# Patient Record
Sex: Female | Born: 1945
Health system: Southern US, Community
[De-identification: ages and names within clinical notes are randomized; demographics above are authoritative.]

## PROBLEM LIST (undated history)

## (undated) ENCOUNTER — Emergency Department (HOSPITAL_COMMUNITY): Admission: EM | Payer: Medicare Other | Source: Home / Self Care

## (undated) DIAGNOSIS — M5430 Sciatica, unspecified side: Secondary | ICD-10-CM

## (undated) DIAGNOSIS — N189 Chronic kidney disease, unspecified: Secondary | ICD-10-CM

## (undated) DIAGNOSIS — I129 Hypertensive chronic kidney disease with stage 1 through stage 4 chronic kidney disease, or unspecified chronic kidney disease: Secondary | ICD-10-CM

## (undated) DIAGNOSIS — I151 Hypertension secondary to other renal disorders: Secondary | ICD-10-CM

## (undated) DIAGNOSIS — K589 Irritable bowel syndrome without diarrhea: Secondary | ICD-10-CM

## (undated) DIAGNOSIS — R51 Headache: Secondary | ICD-10-CM

## (undated) DIAGNOSIS — M1990 Unspecified osteoarthritis, unspecified site: Secondary | ICD-10-CM

## (undated) DIAGNOSIS — E559 Vitamin D deficiency, unspecified: Secondary | ICD-10-CM

## (undated) DIAGNOSIS — R269 Unspecified abnormalities of gait and mobility: Principal | ICD-10-CM

## (undated) DIAGNOSIS — R600 Localized edema: Secondary | ICD-10-CM

## (undated) DIAGNOSIS — J439 Emphysema, unspecified: Secondary | ICD-10-CM

## (undated) DIAGNOSIS — J449 Chronic obstructive pulmonary disease, unspecified: Secondary | ICD-10-CM

## (undated) DIAGNOSIS — Z972 Presence of dental prosthetic device (complete) (partial): Secondary | ICD-10-CM

## (undated) DIAGNOSIS — H8110 Benign paroxysmal vertigo, unspecified ear: Secondary | ICD-10-CM

## (undated) DIAGNOSIS — D329 Benign neoplasm of meninges, unspecified: Secondary | ICD-10-CM

## (undated) DIAGNOSIS — K449 Diaphragmatic hernia without obstruction or gangrene: Secondary | ICD-10-CM

## (undated) DIAGNOSIS — E538 Deficiency of other specified B group vitamins: Secondary | ICD-10-CM

## (undated) DIAGNOSIS — Z8619 Personal history of other infectious and parasitic diseases: Secondary | ICD-10-CM

## (undated) DIAGNOSIS — Z9889 Other specified postprocedural states: Secondary | ICD-10-CM

## (undated) DIAGNOSIS — F5104 Psychophysiologic insomnia: Secondary | ICD-10-CM

## (undated) DIAGNOSIS — N201 Calculus of ureter: Secondary | ICD-10-CM

## (undated) DIAGNOSIS — M51379 Other intervertebral disc degeneration, lumbosacral region without mention of lumbar back pain or lower extremity pain: Secondary | ICD-10-CM

## (undated) DIAGNOSIS — M5137 Other intervertebral disc degeneration, lumbosacral region: Secondary | ICD-10-CM

## (undated) DIAGNOSIS — K219 Gastro-esophageal reflux disease without esophagitis: Secondary | ICD-10-CM

## (undated) DIAGNOSIS — J410 Simple chronic bronchitis: Secondary | ICD-10-CM

## (undated) DIAGNOSIS — K08109 Complete loss of teeth, unspecified cause, unspecified class: Secondary | ICD-10-CM

## (undated) HISTORY — DX: Deficiency of other specified B group vitamins: E53.8

## (undated) HISTORY — DX: Sciatica, unspecified side: M54.30

## (undated) HISTORY — PX: TONSILLECTOMY: SUR1361

## (undated) HISTORY — DX: Unspecified osteoarthritis, unspecified site: M19.90

## (undated) HISTORY — DX: Unspecified abnormalities of gait and mobility: R26.9

## (undated) HISTORY — DX: Psychophysiologic insomnia: F51.04

## (undated) HISTORY — DX: Headache: R51

## (undated) HISTORY — DX: Benign paroxysmal vertigo, unspecified ear: H81.10

## (undated) HISTORY — DX: Chronic obstructive pulmonary disease, unspecified: J44.9

## (undated) HISTORY — DX: Vitamin D deficiency, unspecified: E55.9

---

## 1992-03-29 HISTORY — PX: PARTIAL HYSTERECTOMY: SHX80

## 1992-03-29 HISTORY — PX: VAGINAL HYSTERECTOMY: SUR661

## 2009-05-26 ENCOUNTER — Emergency Department (HOSPITAL_COMMUNITY): Admission: EM | Admit: 2009-05-26 | Discharge: 2009-05-26 | Payer: Self-pay | Admitting: Emergency Medicine

## 2009-06-23 ENCOUNTER — Encounter: Admission: RE | Admit: 2009-06-23 | Discharge: 2009-06-23 | Payer: Self-pay | Admitting: Otolaryngology

## 2010-01-26 ENCOUNTER — Encounter: Admission: RE | Admit: 2010-01-26 | Discharge: 2010-01-26 | Payer: Self-pay | Admitting: Otolaryngology

## 2010-03-03 ENCOUNTER — Encounter: Admission: RE | Admit: 2010-03-03 | Discharge: 2010-03-03 | Payer: Self-pay | Admitting: Internal Medicine

## 2010-04-19 ENCOUNTER — Encounter: Payer: Self-pay | Admitting: Otolaryngology

## 2011-02-01 ENCOUNTER — Other Ambulatory Visit: Payer: Self-pay | Admitting: Internal Medicine

## 2011-02-01 DIAGNOSIS — Z1231 Encounter for screening mammogram for malignant neoplasm of breast: Secondary | ICD-10-CM

## 2011-03-10 ENCOUNTER — Ambulatory Visit: Admission: RE | Admit: 2011-03-10 | Payer: Self-pay | Source: Ambulatory Visit

## 2011-05-31 ENCOUNTER — Ambulatory Visit (INDEPENDENT_AMBULATORY_CARE_PROVIDER_SITE_OTHER): Payer: Medicare Other | Admitting: Family Medicine

## 2011-05-31 DIAGNOSIS — L280 Lichen simplex chronicus: Secondary | ICD-10-CM | POA: Diagnosis not present

## 2011-05-31 DIAGNOSIS — B029 Zoster without complications: Secondary | ICD-10-CM

## 2011-05-31 DIAGNOSIS — L259 Unspecified contact dermatitis, unspecified cause: Secondary | ICD-10-CM

## 2011-05-31 DIAGNOSIS — Z79899 Other long term (current) drug therapy: Secondary | ICD-10-CM

## 2011-05-31 MED ORDER — VALACYCLOVIR HCL 1 G PO TABS: 1000.0000 mg | ORAL_TABLET | Freq: Three times a day (TID) | ORAL | Status: AC

## 2011-05-31 MED ORDER — CETIRIZINE HCL 10 MG PO TABS: 10.0000 mg | ORAL_TABLET | Freq: Every day | ORAL | Status: DC

## 2011-05-31 MED ORDER — FLUOCINONIDE-E 0.05 % EX CREA: TOPICAL_CREAM | CUTANEOUS | Status: DC

## 2011-05-31 NOTE — Progress Notes (Signed)
  Subjective:    Patient ID: Jenny Oliver, female    DOB: Jul 21, 1945, 66 y.o.   MRN: [id number]  HPI  Jenny Oliver is a 66 y.o. female Started 05/13/11 - bit by spider, then tick noted on abdomen.  Tick there less than 12 hours - didn't see it that morning. Itching in area, pulled out with kleenex/tweezers.  Alcohol and ammonia applied to area.  Next day noticed itchy rash.  No bullseye.  Just itchy rash.  Still itching, and rash spreading.  Bumps on left side of abdomen, R upper back few days - itchy rash, slight soreness initially.  No known poison ivy exposure, but dog at home that goes outside.  New patch on L flank to L abdomen - started 48-72 hours ago.    No new detergent, fabric softener.   Tx: cortisone, salve, rubbing alcohol.  Review of Systems  Constitutional: Positive for fatigue. Negative for fever and chills.  HENT: Negative for neck pain and neck stiffness.   Respiratory: Positive for cough. Negative for chest tightness and shortness of breath.        Has had cough since November- followed by Dr. Nehemiah Settle.  No new chest symptoms.   Cardiovascular: Negative for chest pain.  Gastrointestinal: Negative for nausea, vomiting, abdominal pain and abdominal distention.  Skin: Positive for rash.  Neurological: Positive for headaches.  Psychiatric/Behavioral: Negative for hallucinations and dysphoric mood. The patient is not nervous/anxious.        Objective:   Physical Exam  Constitutional: She is oriented to person, place, and time. She appears well-developed and well-nourished.  HENT:  Head: Normocephalic and atraumatic.  Eyes: Conjunctivae are normal. Pupils are equal, round, and reactive to light.  Neck: Normal range of motion.  Cardiovascular: Normal rate, regular rhythm, normal heart sounds and intact distal pulses.   No murmur heard. Pulmonary/Chest: Effort normal and breath sounds normal.  Abdominal: Soft.  Neurological: She is alert and oriented to person, place,  and time.  Skin: Skin is warm and dry. Abrasion, lesion and rash noted. No bruising and no ecchymosis noted. Rash is urticarial. Rash is not maculopapular. There is erythema.          Excoriated R  Upper shoulder.  Other oval ares are patches of urticarial/erythematous lesions, some with slight excoriation.  No target lesions, no petechiae.  Psychiatric: She has a normal mood and affect. Her behavior is normal.    Results for orders placed in visit on 05/31/11  GLUCOSE, POCT (MANUAL RESULT ENTRY)      Component Value Range   POC Glucose 78       2nd MD opinion/exam obtained..    Assessment & Plan:  Jenny Oliver is a 66 y.o. female With pruritic rash, mutiple areas on trunk since tick bite approximately 2 1/2 weeks ago,  Suspected contact dermatitis on most of trunk, with new coalesecent erythe,matous patches on L flank concerning for secondary herpes zoster.  Start lidex top BID prn to affected areas.Valtrex 1 gram TID #21 for possible zoster.  zyrtec 10mg  qd prn pruritus.  Avoid drying agents like alcohl to area - ok to use Aveeno lotion topically.  Return to the clinic or go to the nearest emergency room ifr symptoms worsen or new symptoms occur. - Hx cough - followed by primary MD, but rtc/er precautions given.

## 2011-05-31 NOTE — Patient Instructions (Signed)
Take valtrex as prescribed for possible shingles,  Avoid drying agents to skin - ok to use Aveeno lotion.  cetririzine - 1 per day as needed for itching, and prescribed steroid cream up to twice per day to itching areas.    Return to the clinic or go to the nearest emergency room if any of your symptoms worsen or new symptoms occur.

## 2011-06-21 ENCOUNTER — Ambulatory Visit (INDEPENDENT_AMBULATORY_CARE_PROVIDER_SITE_OTHER): Payer: Medicare Other | Admitting: Emergency Medicine

## 2011-06-21 ENCOUNTER — Ambulatory Visit: Payer: Medicare Other

## 2011-06-21 VITALS — BP 117/72 | HR 66 | Temp 98.0°F | Resp 16 | Ht 63.25 in | Wt 149.0 lb

## 2011-06-21 DIAGNOSIS — W57XXXA Bitten or stung by nonvenomous insect and other nonvenomous arthropods, initial encounter: Secondary | ICD-10-CM | POA: Diagnosis not present

## 2011-06-21 DIAGNOSIS — J069 Acute upper respiratory infection, unspecified: Secondary | ICD-10-CM

## 2011-06-21 DIAGNOSIS — R05 Cough: Secondary | ICD-10-CM

## 2011-06-21 DIAGNOSIS — J209 Acute bronchitis, unspecified: Secondary | ICD-10-CM

## 2011-06-21 DIAGNOSIS — T148 Other injury of unspecified body region: Secondary | ICD-10-CM | POA: Diagnosis not present

## 2011-06-21 DIAGNOSIS — I2510 Atherosclerotic heart disease of native coronary artery without angina pectoris: Secondary | ICD-10-CM

## 2011-06-21 LAB — POCT CBC
Granulocyte percent: 25.7 %G — AB (ref 37–80)
HCT, POC: 42.2 % (ref 37.7–47.9)
Lymph, poc: 3.3 (ref 0.6–3.4)
MCHC: 32.7 g/dL (ref 31.8–35.4)
MCV: 101.9 fL — AB (ref 80–97)
POC LYMPH PERCENT: 67.8 %L — AB (ref 10–50)
RDW, POC: 14 %
WBC: 4.8 10*3/uL (ref 4.6–10.2)

## 2011-06-21 MED ORDER — DOXYCYCLINE HYCLATE 100 MG PO TABS: 100.0000 mg | ORAL_TABLET | Freq: Two times a day (BID) | ORAL | Status: AC

## 2011-06-21 MED ORDER — BENZONATATE 200 MG PO CAPS: 200.0000 mg | ORAL_CAPSULE | Freq: Three times a day (TID) | ORAL | Status: AC | PRN

## 2011-06-21 NOTE — Progress Notes (Signed)
  Subjective:    Patient ID: Jenny Oliver, female    DOB: 23-Sep-1945, 66 y.o.   MRN: [id number]  HPI enters with a chief complaint of having a tick bite to the abdomen approximately one month ago. Following this she has felt well. She initially had a rash which resolved. She now has about a week history of head congestion sore throat cough and she states her cough is nonproductive. It has not been associated with fevers chills or night sweats. She is a heavy smoker.    Review of Systems noncontributory except as relates to this acute illness.     Objective:   Physical Exam  Constitutional: She appears well-developed.  HENT:  Right Ear: External ear normal.  Left Ear: External ear normal.  Eyes: Pupils are equal, round, and reactive to light.  Neck: No JVD present. No tracheal deviation present. No thyromegaly present.  Cardiovascular: Normal heart sounds.   Pulmonary/Chest: No respiratory distress. She has no wheezes. She has no rales. She exhibits no tenderness.  Lymphadenopathy:    She has no cervical adenopathy.    [facility] reading (PRIMARY) by  Dr.Julane Crock         Assessment & Plan:   Presents 2 problems at the present time.  A history of a tick bite about a month ago. She really does not have a true symptoms of Lyme disease but is concerned about that. Problem is a recent upper rest or infection which is associated with sore throat and a dry cough.

## 2011-06-22 DIAGNOSIS — M5137 Other intervertebral disc degeneration, lumbosacral region: Secondary | ICD-10-CM | POA: Diagnosis not present

## 2011-06-22 DIAGNOSIS — M62838 Other muscle spasm: Secondary | ICD-10-CM | POA: Diagnosis not present

## 2011-06-22 DIAGNOSIS — M999 Biomechanical lesion, unspecified: Secondary | ICD-10-CM | POA: Diagnosis not present

## 2011-06-22 LAB — B. BURGDORFI ANTIBODIES: B burgdorferi Ab IgG+IgM: 0.16 {ISR}

## 2011-06-23 LAB — ROCKY MTN SPOTTED FVR AB, IGM-BLOOD: ROCKY MTN SPOTTED FEVER, IGM: 0.11 IV

## 2011-06-24 DIAGNOSIS — M5137 Other intervertebral disc degeneration, lumbosacral region: Secondary | ICD-10-CM | POA: Diagnosis not present

## 2011-06-24 DIAGNOSIS — M999 Biomechanical lesion, unspecified: Secondary | ICD-10-CM | POA: Diagnosis not present

## 2011-06-24 DIAGNOSIS — M62838 Other muscle spasm: Secondary | ICD-10-CM | POA: Diagnosis not present

## 2011-07-22 DIAGNOSIS — M5137 Other intervertebral disc degeneration, lumbosacral region: Secondary | ICD-10-CM | POA: Diagnosis not present

## 2011-07-22 DIAGNOSIS — M62838 Other muscle spasm: Secondary | ICD-10-CM | POA: Diagnosis not present

## 2011-07-22 DIAGNOSIS — M999 Biomechanical lesion, unspecified: Secondary | ICD-10-CM | POA: Diagnosis not present

## 2011-08-05 DIAGNOSIS — E782 Mixed hyperlipidemia: Secondary | ICD-10-CM | POA: Diagnosis not present

## 2011-08-05 DIAGNOSIS — F17200 Nicotine dependence, unspecified, uncomplicated: Secondary | ICD-10-CM | POA: Diagnosis not present

## 2011-08-05 DIAGNOSIS — I10 Essential (primary) hypertension: Secondary | ICD-10-CM | POA: Diagnosis not present

## 2011-08-06 DIAGNOSIS — M999 Biomechanical lesion, unspecified: Secondary | ICD-10-CM | POA: Diagnosis not present

## 2011-08-06 DIAGNOSIS — M5137 Other intervertebral disc degeneration, lumbosacral region: Secondary | ICD-10-CM | POA: Diagnosis not present

## 2011-08-06 DIAGNOSIS — M62838 Other muscle spasm: Secondary | ICD-10-CM | POA: Diagnosis not present

## 2011-08-16 DIAGNOSIS — M62838 Other muscle spasm: Secondary | ICD-10-CM | POA: Diagnosis not present

## 2011-08-16 DIAGNOSIS — M5137 Other intervertebral disc degeneration, lumbosacral region: Secondary | ICD-10-CM | POA: Diagnosis not present

## 2011-08-16 DIAGNOSIS — M999 Biomechanical lesion, unspecified: Secondary | ICD-10-CM | POA: Diagnosis not present

## 2011-08-31 DIAGNOSIS — E559 Vitamin D deficiency, unspecified: Secondary | ICD-10-CM | POA: Diagnosis not present

## 2011-08-31 DIAGNOSIS — R7301 Impaired fasting glucose: Secondary | ICD-10-CM | POA: Diagnosis not present

## 2011-08-31 DIAGNOSIS — E785 Hyperlipidemia, unspecified: Secondary | ICD-10-CM | POA: Diagnosis not present

## 2011-08-31 DIAGNOSIS — E269 Hyperaldosteronism, unspecified: Secondary | ICD-10-CM | POA: Diagnosis not present

## 2011-09-02 DIAGNOSIS — R7301 Impaired fasting glucose: Secondary | ICD-10-CM | POA: Diagnosis not present

## 2011-09-02 DIAGNOSIS — E559 Vitamin D deficiency, unspecified: Secondary | ICD-10-CM | POA: Diagnosis not present

## 2011-09-02 DIAGNOSIS — I10 Essential (primary) hypertension: Secondary | ICD-10-CM | POA: Diagnosis not present

## 2011-09-02 DIAGNOSIS — R7309 Other abnormal glucose: Secondary | ICD-10-CM | POA: Diagnosis not present

## 2011-09-02 DIAGNOSIS — M899 Disorder of bone, unspecified: Secondary | ICD-10-CM | POA: Diagnosis not present

## 2011-09-02 DIAGNOSIS — E269 Hyperaldosteronism, unspecified: Secondary | ICD-10-CM | POA: Diagnosis not present

## 2011-09-02 DIAGNOSIS — F17200 Nicotine dependence, unspecified, uncomplicated: Secondary | ICD-10-CM | POA: Diagnosis not present

## 2011-09-02 DIAGNOSIS — E785 Hyperlipidemia, unspecified: Secondary | ICD-10-CM | POA: Diagnosis not present

## 2011-09-03 DIAGNOSIS — M999 Biomechanical lesion, unspecified: Secondary | ICD-10-CM | POA: Diagnosis not present

## 2011-09-03 DIAGNOSIS — M5137 Other intervertebral disc degeneration, lumbosacral region: Secondary | ICD-10-CM | POA: Diagnosis not present

## 2011-09-03 DIAGNOSIS — M62838 Other muscle spasm: Secondary | ICD-10-CM | POA: Diagnosis not present

## 2011-09-29 DIAGNOSIS — M5137 Other intervertebral disc degeneration, lumbosacral region: Secondary | ICD-10-CM | POA: Diagnosis not present

## 2011-09-29 DIAGNOSIS — M62838 Other muscle spasm: Secondary | ICD-10-CM | POA: Diagnosis not present

## 2011-09-29 DIAGNOSIS — M999 Biomechanical lesion, unspecified: Secondary | ICD-10-CM | POA: Diagnosis not present

## 2011-10-18 DIAGNOSIS — R1310 Dysphagia, unspecified: Secondary | ICD-10-CM | POA: Diagnosis not present

## 2011-10-18 DIAGNOSIS — R141 Gas pain: Secondary | ICD-10-CM | POA: Diagnosis not present

## 2011-10-18 DIAGNOSIS — K219 Gastro-esophageal reflux disease without esophagitis: Secondary | ICD-10-CM | POA: Diagnosis not present

## 2011-10-18 DIAGNOSIS — R109 Unspecified abdominal pain: Secondary | ICD-10-CM | POA: Diagnosis not present

## 2011-10-18 DIAGNOSIS — R142 Eructation: Secondary | ICD-10-CM | POA: Diagnosis not present

## 2011-10-26 DIAGNOSIS — M5137 Other intervertebral disc degeneration, lumbosacral region: Secondary | ICD-10-CM | POA: Diagnosis not present

## 2011-10-26 DIAGNOSIS — M999 Biomechanical lesion, unspecified: Secondary | ICD-10-CM | POA: Diagnosis not present

## 2011-10-26 DIAGNOSIS — M62838 Other muscle spasm: Secondary | ICD-10-CM | POA: Diagnosis not present

## 2011-11-03 ENCOUNTER — Ambulatory Visit (HOSPITAL_COMMUNITY): Payer: Medicare Other | Admitting: Anesthesiology

## 2011-11-03 ENCOUNTER — Encounter (HOSPITAL_COMMUNITY): Payer: Self-pay | Admitting: Anesthesiology

## 2011-11-03 ENCOUNTER — Encounter (HOSPITAL_COMMUNITY)
Admission: RE | Disposition: A | Discharge status: Home / Self Care | Payer: Self-pay | Source: Ambulatory Visit | Attending: Gastroenterology

## 2011-11-03 ENCOUNTER — Encounter (HOSPITAL_COMMUNITY): Payer: Self-pay

## 2011-11-03 ENCOUNTER — Ambulatory Visit (HOSPITAL_COMMUNITY)
Admission: RE | Admit: 2011-11-03 | Discharge: 2011-11-03 | Disposition: A | Discharge status: Home / Self Care | Payer: Medicare Other | Source: Ambulatory Visit | Attending: Gastroenterology | Admitting: Gastroenterology

## 2011-11-03 DIAGNOSIS — K228 Other specified diseases of esophagus: Secondary | ICD-10-CM | POA: Insufficient documentation

## 2011-11-03 DIAGNOSIS — K219 Gastro-esophageal reflux disease without esophagitis: Secondary | ICD-10-CM | POA: Insufficient documentation

## 2011-11-03 DIAGNOSIS — F17200 Nicotine dependence, unspecified, uncomplicated: Secondary | ICD-10-CM | POA: Insufficient documentation

## 2011-11-03 DIAGNOSIS — R1310 Dysphagia, unspecified: Secondary | ICD-10-CM | POA: Insufficient documentation

## 2011-11-03 DIAGNOSIS — R109 Unspecified abdominal pain: Secondary | ICD-10-CM | POA: Diagnosis not present

## 2011-11-03 DIAGNOSIS — K2990 Gastroduodenitis, unspecified, without bleeding: Secondary | ICD-10-CM | POA: Diagnosis not present

## 2011-11-03 DIAGNOSIS — K449 Diaphragmatic hernia without obstruction or gangrene: Secondary | ICD-10-CM | POA: Diagnosis not present

## 2011-11-03 DIAGNOSIS — R1011 Right upper quadrant pain: Secondary | ICD-10-CM | POA: Insufficient documentation

## 2011-11-03 DIAGNOSIS — K2289 Other specified disease of esophagus: Secondary | ICD-10-CM | POA: Insufficient documentation

## 2011-11-03 DIAGNOSIS — K2940 Chronic atrophic gastritis without bleeding: Secondary | ICD-10-CM | POA: Insufficient documentation

## 2011-11-03 DIAGNOSIS — N189 Chronic kidney disease, unspecified: Secondary | ICD-10-CM | POA: Diagnosis not present

## 2011-11-03 DIAGNOSIS — K2970 Gastritis, unspecified, without bleeding: Secondary | ICD-10-CM | POA: Diagnosis not present

## 2011-11-03 HISTORY — DX: Chronic kidney disease, unspecified: N18.9

## 2011-11-03 HISTORY — PX: BALLOON DILATION: SHX5330

## 2011-11-03 HISTORY — DX: Hypertension secondary to other renal disorders: I15.1

## 2011-11-03 HISTORY — DX: Gastro-esophageal reflux disease without esophagitis: K21.9

## 2011-11-03 SURGERY — ESOPHAGOGASTRODUODENOSCOPY (EGD) WITH PROPOFOL
Anesthesia: Monitor Anesthesia Care

## 2011-11-03 MED ORDER — PROPOFOL 10 MG/ML IV EMUL
INTRAVENOUS | Status: DC | PRN
  Administered 2011-11-03: 75 ug/kg/min via INTRAVENOUS

## 2011-11-03 MED ORDER — LACTATED RINGERS IV SOLN
INTRAVENOUS | Status: DC | PRN
  Administered 2011-11-03: 09:00:00 via INTRAVENOUS

## 2011-11-03 MED ORDER — PROPOFOL 10 MG/ML IV BOLUS
INTRAVENOUS | Status: DC | PRN
  Administered 2011-11-03 (×5): 20 mg via INTRAVENOUS

## 2011-11-03 MED ORDER — SODIUM CHLORIDE 0.9 % IV SOLN: INTRAVENOUS | Status: DC

## 2011-11-03 MED ORDER — MIDAZOLAM HCL 5 MG/5ML IJ SOLN
INTRAMUSCULAR | Status: DC | PRN
  Administered 2011-11-03 (×2): 1 mg via INTRAVENOUS

## 2011-11-03 MED ORDER — BUTAMBEN-TETRACAINE-BENZOCAINE 2-2-14 % EX AERO
INHALATION_SPRAY | CUTANEOUS | Status: DC | PRN
  Administered 2011-11-03: 2 via TOPICAL

## 2011-11-03 MED ORDER — FENTANYL CITRATE 0.05 MG/ML IJ SOLN
INTRAMUSCULAR | Status: DC | PRN
  Administered 2011-11-03: 25 ug via INTRAVENOUS

## 2011-11-03 MED ORDER — LACTATED RINGERS IV SOLN
INTRAVENOUS | Status: DC
  Administered 2011-11-03: 1000 mL via INTRAVENOUS

## 2011-11-03 SURGICAL SUPPLY — 14 items

## 2011-11-03 NOTE — Transfer of Care (Signed)
Immediate Anesthesia Transfer of Care Note  Patient: Jenny Oliver  Procedure(s) Performed: Procedure(s) (LRB): ESOPHAGOGASTRODUODENOSCOPY (EGD) WITH PROPOFOL (N/A) BALLOON DILATION (N/A)  Patient Location: PACU  Anesthesia Type: MAC  Level of Consciousness: sedated, patient cooperative and responds to stimulaton  Airway & Oxygen Therapy: Patient Spontanous Breathing and Patient connected to face mask oxgen  Post-op Assessment: Report given to PACU RN and Post -op Vital signs reviewed and stable  Post vital signs: Reviewed and stable  Complications: No apparent anesthesia complications

## 2011-11-03 NOTE — Anesthesia Postprocedure Evaluation (Signed)
  Anesthesia Post-op Note  Patient: Jenny Oliver  Procedure(s) Performed: Procedure(s) (LRB): ESOPHAGOGASTRODUODENOSCOPY (EGD) WITH PROPOFOL (N/A) BALLOON DILATION (N/A)  Patient Location: PACU  Anesthesia Type: MAC  Level of Consciousness: awake and alert   Airway and Oxygen Therapy: Patient Spontanous Breathing  Post-op Pain: mild  Post-op Assessment: Post-op Vital signs reviewed, Patient's Cardiovascular Status Stable, Respiratory Function Stable, Patent Airway and No signs of Nausea or vomiting  Post-op Vital Signs: stable  Complications: No apparent anesthesia complications

## 2011-11-03 NOTE — Anesthesia Preprocedure Evaluation (Addendum)
Anesthesia Evaluation  Patient identified by MRN, date of birth, ID band Patient awake    Reviewed: Allergy & Precautions, H&P , NPO status , Patient's Chart, lab work & pertinent test results  Airway Mallampati: II TM Distance: >3 FB Neck ROM: Full    Dental No notable dental hx. (+) Edentulous Upper, Edentulous Lower, Lower Dentures and Upper Dentures   Pulmonary neg pulmonary ROS, shortness of breath and with exertion, Current Smoker,  breath sounds clear to auscultation  Pulmonary exam normal       Cardiovascular negative cardio ROS  Rhythm:Regular Rate:Normal     Neuro/Psych negative neurological ROS  negative psych ROS   GI/Hepatic negative GI ROS, Neg liver ROS, GERD-  ,  Endo/Other  negative endocrine ROS  Renal/GU negative Renal ROSLiddle's syndrome   negative genitourinary   Musculoskeletal negative musculoskeletal ROS (+)   Abdominal   Peds negative pediatric ROS (+)  Hematology negative hematology ROS (+)   Anesthesia Other Findings   Reproductive/Obstetrics negative OB ROS                           Anesthesia Physical Anesthesia Plan  ASA: II  Anesthesia Plan: MAC   Post-op Pain Management:    Induction: Intravenous  Airway Management Planned: Simple Face Mask  Additional Equipment:   Intra-op Plan:   Post-operative Plan: Extubation in OR  Informed Consent: I have reviewed the patients History and Physical, chart, labs and discussed the procedure including the risks, benefits and alternatives for the proposed anesthesia with the patient or authorized representative who has indicated his/her understanding and acceptance.   Dental advisory given  Plan Discussed with: CRNA  Anesthesia Plan Comments:         Anesthesia Quick Evaluation

## 2011-11-03 NOTE — H&P (Signed)
Patient interval history reviewed.  Patient examined again.  There has been no change from documented H/P dated 10/18/11 (scanned into chart from our office) except as documented above.  Assessment:  1.  Dysphagia. 2.  Left upper quadrant abdominal pain. 3.  History H. Pylori gastritis.  Plan:  1.  Endoscopy with possible esophageal dilatation ([place] versus TTS balloon). 2.  Possible gastric or small bowel biopsies. 3.  Risks (bleeding, infection, bowel perforation that could require surgery, sedation-related changes in cardiopulmonary systems), benefits (identification and possible treatment of source of symptoms, exclusion of certain causes of symptoms), and alternatives (watchful waiting, radiographic imaging studies, empiric medical treatment) of upper endoscopy with possible esophageal dilatation (EGD +/- dilatation) were explained to patient in detail and patient wishes to proceed.

## 2011-11-03 NOTE — Op Note (Signed)
[facility] [address] [place], [place]  [address]  ENDOSCOPY PROCEDURE REPORT  PATIENT:  Jenny Oliver, Jenny Oliver  MR#:  [id number] BIRTHDATE:  [birth year], 66 yrs. old  GENDER:  female  ENDOSCOPIST:  Willis Modena, MD Referred by:  Renford Dills, M.D.  PROCEDURE DATE:  11/03/2011 PROCEDURE:  EGD with biopsy, 43239, EGD with balloon dilatation ASA CLASS:  Class III INDICATIONS:  dysphagia, left upper quadrant abdominal pain  MEDICATIONS:    MAC sedation, administered by CRNA, Cetacaine spray x 2  DESCRIPTION OF PROCEDURE:   After the risks benefits and alternatives of the procedure were thoroughly explained, informed consent was obtained.  The Pentax Gastroscope [id number] endoscope was introduced through the mouth and advanced to the second portion of the duodenum, without limitations.  The instrument was slowly withdrawn as the mucosa was fully examined.  <<PROCEDUREIMAGES>>  FINDINGS:  Tortuous distal esophagus.  Small hiatal hernia.  No obvious GE junction stricture; given barium swallow findings, the GE junction was dilated serially to 16.54mm with TTS balloon dilation catheter  after completion of our diagnostic exa (15mm 60 seconds, mild resistance; 16.27mm 60 seconds, mild-to-moderate resistance).  Mild pangastritis, biopsied with cold forceps. Otherwise normal stomach, pylorus, and duodenum to the second portion.  ENDOSCOPIC IMPRESSION:    1.   Hiatal hernia, tortuous distal esophagus. 2.   Gastritis, biopsied. 3. Empiric esophageal dilatation performed.  Suspect dysphagia is multifactorial (GERD,               esophageal dysmotility).  RECOMMENDATIONS:      1.  Watch for potential complications of procedure. 2.  Follow clinical response to dilatation. 3.  Await biopsy  results. 4.  Follow-up with [place] a 4-6 weeks.  REPEAT EXAM:  No  ______________________________ Willis Modena  CC:  n. eSIGNEDWillis Modena at 11/03/2011 10:17 AM  [place] No,  [phone]

## 2011-11-04 ENCOUNTER — Encounter (HOSPITAL_COMMUNITY): Payer: Self-pay | Admitting: Gastroenterology

## 2011-11-04 ENCOUNTER — Other Ambulatory Visit: Payer: Self-pay | Admitting: Internal Medicine

## 2011-11-04 DIAGNOSIS — R51 Headache: Secondary | ICD-10-CM | POA: Diagnosis not present

## 2011-11-08 ENCOUNTER — Ambulatory Visit
Admission: RE | Admit: 2011-11-08 | Discharge: 2011-11-08 | Disposition: A | Discharge status: Home / Self Care | Payer: Medicare Other | Source: Ambulatory Visit | Attending: Internal Medicine | Admitting: Internal Medicine

## 2011-11-08 DIAGNOSIS — R519 Headache, unspecified: Secondary | ICD-10-CM

## 2011-11-08 DIAGNOSIS — R51 Headache: Secondary | ICD-10-CM | POA: Diagnosis not present

## 2011-11-08 MED ORDER — IOHEXOL 300 MG/ML  SOLN
75.0000 mL | Freq: Once | INTRAMUSCULAR | Status: AC | PRN
  Administered 2011-11-08: 75 mL via INTRAVENOUS

## 2011-11-12 ENCOUNTER — Other Ambulatory Visit: Payer: Self-pay | Admitting: *Deleted

## 2011-11-12 ENCOUNTER — Other Ambulatory Visit: Payer: Self-pay | Admitting: Internal Medicine

## 2011-11-12 DIAGNOSIS — R9089 Other abnormal findings on diagnostic imaging of central nervous system: Secondary | ICD-10-CM

## 2011-11-17 ENCOUNTER — Ambulatory Visit
Admission: RE | Admit: 2011-11-17 | Discharge: 2011-11-17 | Disposition: A | Discharge status: Home / Self Care | Payer: Medicare Other | Source: Ambulatory Visit | Attending: Internal Medicine | Admitting: Internal Medicine

## 2011-11-17 DIAGNOSIS — R51 Headache: Secondary | ICD-10-CM | POA: Diagnosis not present

## 2011-11-17 DIAGNOSIS — R9089 Other abnormal findings on diagnostic imaging of central nervous system: Secondary | ICD-10-CM

## 2011-11-19 DIAGNOSIS — R51 Headache: Secondary | ICD-10-CM | POA: Diagnosis not present

## 2011-11-24 DIAGNOSIS — M62838 Other muscle spasm: Secondary | ICD-10-CM | POA: Diagnosis not present

## 2011-11-24 DIAGNOSIS — M999 Biomechanical lesion, unspecified: Secondary | ICD-10-CM | POA: Diagnosis not present

## 2011-11-24 DIAGNOSIS — M5137 Other intervertebral disc degeneration, lumbosacral region: Secondary | ICD-10-CM | POA: Diagnosis not present

## 2011-11-30 DIAGNOSIS — G518 Other disorders of facial nerve: Secondary | ICD-10-CM | POA: Diagnosis not present

## 2011-11-30 DIAGNOSIS — Z5189 Encounter for other specified aftercare: Secondary | ICD-10-CM | POA: Diagnosis not present

## 2011-11-30 DIAGNOSIS — M62838 Other muscle spasm: Secondary | ICD-10-CM | POA: Diagnosis not present

## 2011-11-30 DIAGNOSIS — Z049 Encounter for examination and observation for unspecified reason: Secondary | ICD-10-CM | POA: Diagnosis not present

## 2011-11-30 DIAGNOSIS — G44229 Chronic tension-type headache, not intractable: Secondary | ICD-10-CM | POA: Diagnosis not present

## 2011-11-30 DIAGNOSIS — Z79899 Other long term (current) drug therapy: Secondary | ICD-10-CM | POA: Diagnosis not present

## 2011-11-30 DIAGNOSIS — IMO0001 Reserved for inherently not codable concepts without codable children: Secondary | ICD-10-CM | POA: Diagnosis not present

## 2011-12-06 DIAGNOSIS — I10 Essential (primary) hypertension: Secondary | ICD-10-CM | POA: Diagnosis not present

## 2011-12-06 DIAGNOSIS — E785 Hyperlipidemia, unspecified: Secondary | ICD-10-CM | POA: Diagnosis not present

## 2011-12-06 DIAGNOSIS — R7301 Impaired fasting glucose: Secondary | ICD-10-CM | POA: Diagnosis not present

## 2011-12-08 DIAGNOSIS — E559 Vitamin D deficiency, unspecified: Secondary | ICD-10-CM | POA: Diagnosis not present

## 2011-12-08 DIAGNOSIS — R7309 Other abnormal glucose: Secondary | ICD-10-CM | POA: Diagnosis not present

## 2011-12-08 DIAGNOSIS — M899 Disorder of bone, unspecified: Secondary | ICD-10-CM | POA: Diagnosis not present

## 2011-12-08 DIAGNOSIS — E785 Hyperlipidemia, unspecified: Secondary | ICD-10-CM | POA: Diagnosis not present

## 2011-12-08 DIAGNOSIS — E269 Hyperaldosteronism, unspecified: Secondary | ICD-10-CM | POA: Diagnosis not present

## 2011-12-08 DIAGNOSIS — R7301 Impaired fasting glucose: Secondary | ICD-10-CM | POA: Diagnosis not present

## 2011-12-08 DIAGNOSIS — M949 Disorder of cartilage, unspecified: Secondary | ICD-10-CM | POA: Diagnosis not present

## 2011-12-08 DIAGNOSIS — I10 Essential (primary) hypertension: Secondary | ICD-10-CM | POA: Diagnosis not present

## 2011-12-08 DIAGNOSIS — F17200 Nicotine dependence, unspecified, uncomplicated: Secondary | ICD-10-CM | POA: Diagnosis not present

## 2011-12-14 DIAGNOSIS — M62838 Other muscle spasm: Secondary | ICD-10-CM | POA: Diagnosis not present

## 2011-12-14 DIAGNOSIS — G518 Other disorders of facial nerve: Secondary | ICD-10-CM | POA: Diagnosis not present

## 2011-12-14 DIAGNOSIS — IMO0001 Reserved for inherently not codable concepts without codable children: Secondary | ICD-10-CM | POA: Diagnosis not present

## 2011-12-14 DIAGNOSIS — G44229 Chronic tension-type headache, not intractable: Secondary | ICD-10-CM | POA: Diagnosis not present

## 2011-12-17 DIAGNOSIS — R1310 Dysphagia, unspecified: Secondary | ICD-10-CM | POA: Diagnosis not present

## 2011-12-17 DIAGNOSIS — R630 Anorexia: Secondary | ICD-10-CM | POA: Diagnosis not present

## 2011-12-21 DIAGNOSIS — M545 Low back pain: Secondary | ICD-10-CM | POA: Diagnosis not present

## 2011-12-22 DIAGNOSIS — M62838 Other muscle spasm: Secondary | ICD-10-CM | POA: Diagnosis not present

## 2011-12-22 DIAGNOSIS — M5137 Other intervertebral disc degeneration, lumbosacral region: Secondary | ICD-10-CM | POA: Diagnosis not present

## 2011-12-22 DIAGNOSIS — M999 Biomechanical lesion, unspecified: Secondary | ICD-10-CM | POA: Diagnosis not present

## 2012-01-03 DIAGNOSIS — M5137 Other intervertebral disc degeneration, lumbosacral region: Secondary | ICD-10-CM | POA: Diagnosis not present

## 2012-01-03 DIAGNOSIS — M62838 Other muscle spasm: Secondary | ICD-10-CM | POA: Diagnosis not present

## 2012-01-03 DIAGNOSIS — M999 Biomechanical lesion, unspecified: Secondary | ICD-10-CM | POA: Diagnosis not present

## 2012-01-04 DIAGNOSIS — M7750 Other enthesopathy of unspecified foot: Secondary | ICD-10-CM | POA: Diagnosis not present

## 2012-01-12 DIAGNOSIS — L570 Actinic keratosis: Secondary | ICD-10-CM | POA: Diagnosis not present

## 2012-01-19 DIAGNOSIS — M5137 Other intervertebral disc degeneration, lumbosacral region: Secondary | ICD-10-CM | POA: Diagnosis not present

## 2012-01-19 DIAGNOSIS — M999 Biomechanical lesion, unspecified: Secondary | ICD-10-CM | POA: Diagnosis not present

## 2012-01-19 DIAGNOSIS — M62838 Other muscle spasm: Secondary | ICD-10-CM | POA: Diagnosis not present

## 2012-01-25 DIAGNOSIS — L570 Actinic keratosis: Secondary | ICD-10-CM | POA: Diagnosis not present

## 2012-02-01 DIAGNOSIS — M7750 Other enthesopathy of unspecified foot: Secondary | ICD-10-CM | POA: Diagnosis not present

## 2012-02-10 DIAGNOSIS — E782 Mixed hyperlipidemia: Secondary | ICD-10-CM | POA: Diagnosis not present

## 2012-02-10 DIAGNOSIS — F329 Major depressive disorder, single episode, unspecified: Secondary | ICD-10-CM | POA: Diagnosis not present

## 2012-02-10 DIAGNOSIS — M949 Disorder of cartilage, unspecified: Secondary | ICD-10-CM | POA: Diagnosis not present

## 2012-02-10 DIAGNOSIS — R7301 Impaired fasting glucose: Secondary | ICD-10-CM | POA: Diagnosis not present

## 2012-02-10 DIAGNOSIS — Z0000 Encounter for general adult medical examination without abnormal findings: Secondary | ICD-10-CM | POA: Diagnosis not present

## 2012-02-10 DIAGNOSIS — F17200 Nicotine dependence, unspecified, uncomplicated: Secondary | ICD-10-CM | POA: Diagnosis not present

## 2012-02-10 DIAGNOSIS — I10 Essential (primary) hypertension: Secondary | ICD-10-CM | POA: Diagnosis not present

## 2012-02-15 DIAGNOSIS — L570 Actinic keratosis: Secondary | ICD-10-CM | POA: Diagnosis not present

## 2012-02-16 DIAGNOSIS — M5137 Other intervertebral disc degeneration, lumbosacral region: Secondary | ICD-10-CM | POA: Diagnosis not present

## 2012-02-16 DIAGNOSIS — M999 Biomechanical lesion, unspecified: Secondary | ICD-10-CM | POA: Diagnosis not present

## 2012-02-16 DIAGNOSIS — M62838 Other muscle spasm: Secondary | ICD-10-CM | POA: Diagnosis not present

## 2012-03-13 DIAGNOSIS — R109 Unspecified abdominal pain: Secondary | ICD-10-CM | POA: Diagnosis not present

## 2012-03-13 DIAGNOSIS — R1310 Dysphagia, unspecified: Secondary | ICD-10-CM | POA: Diagnosis not present

## 2012-03-13 DIAGNOSIS — Z86010 Personal history of colonic polyps: Secondary | ICD-10-CM | POA: Diagnosis not present

## 2012-03-14 DIAGNOSIS — G44229 Chronic tension-type headache, not intractable: Secondary | ICD-10-CM | POA: Diagnosis not present

## 2012-03-15 DIAGNOSIS — M62838 Other muscle spasm: Secondary | ICD-10-CM | POA: Diagnosis not present

## 2012-03-15 DIAGNOSIS — M999 Biomechanical lesion, unspecified: Secondary | ICD-10-CM | POA: Diagnosis not present

## 2012-03-15 DIAGNOSIS — M5137 Other intervertebral disc degeneration, lumbosacral region: Secondary | ICD-10-CM | POA: Diagnosis not present

## 2012-03-17 DIAGNOSIS — M999 Biomechanical lesion, unspecified: Secondary | ICD-10-CM | POA: Diagnosis not present

## 2012-03-17 DIAGNOSIS — M5137 Other intervertebral disc degeneration, lumbosacral region: Secondary | ICD-10-CM | POA: Diagnosis not present

## 2012-03-17 DIAGNOSIS — M62838 Other muscle spasm: Secondary | ICD-10-CM | POA: Diagnosis not present

## 2012-03-21 ENCOUNTER — Ambulatory Visit
Admission: RE | Admit: 2012-03-21 | Discharge: 2012-03-21 | Disposition: A | Discharge status: Home / Self Care | Payer: Medicare Other | Source: Ambulatory Visit | Attending: Internal Medicine | Admitting: Internal Medicine

## 2012-03-21 DIAGNOSIS — Z1231 Encounter for screening mammogram for malignant neoplasm of breast: Secondary | ICD-10-CM

## 2012-04-13 DIAGNOSIS — R51 Headache: Secondary | ICD-10-CM | POA: Diagnosis not present

## 2012-04-14 DIAGNOSIS — M999 Biomechanical lesion, unspecified: Secondary | ICD-10-CM | POA: Diagnosis not present

## 2012-04-14 DIAGNOSIS — M5137 Other intervertebral disc degeneration, lumbosacral region: Secondary | ICD-10-CM | POA: Diagnosis not present

## 2012-04-14 DIAGNOSIS — M62838 Other muscle spasm: Secondary | ICD-10-CM | POA: Diagnosis not present

## 2012-04-27 DIAGNOSIS — R51 Headache: Secondary | ICD-10-CM | POA: Diagnosis not present

## 2012-05-05 DIAGNOSIS — M62838 Other muscle spasm: Secondary | ICD-10-CM | POA: Diagnosis not present

## 2012-05-05 DIAGNOSIS — M999 Biomechanical lesion, unspecified: Secondary | ICD-10-CM | POA: Diagnosis not present

## 2012-05-05 DIAGNOSIS — M5137 Other intervertebral disc degeneration, lumbosacral region: Secondary | ICD-10-CM | POA: Diagnosis not present

## 2012-05-08 DIAGNOSIS — M899 Disorder of bone, unspecified: Secondary | ICD-10-CM | POA: Diagnosis not present

## 2012-05-08 DIAGNOSIS — M949 Disorder of cartilage, unspecified: Secondary | ICD-10-CM | POA: Diagnosis not present

## 2012-05-19 ENCOUNTER — Other Ambulatory Visit: Payer: Self-pay | Admitting: Gastroenterology

## 2012-05-19 DIAGNOSIS — R109 Unspecified abdominal pain: Secondary | ICD-10-CM | POA: Diagnosis not present

## 2012-05-22 DIAGNOSIS — M999 Biomechanical lesion, unspecified: Secondary | ICD-10-CM | POA: Diagnosis not present

## 2012-05-22 DIAGNOSIS — M5137 Other intervertebral disc degeneration, lumbosacral region: Secondary | ICD-10-CM | POA: Diagnosis not present

## 2012-06-08 DIAGNOSIS — R7309 Other abnormal glucose: Secondary | ICD-10-CM | POA: Diagnosis not present

## 2012-06-08 DIAGNOSIS — M899 Disorder of bone, unspecified: Secondary | ICD-10-CM | POA: Diagnosis not present

## 2012-06-08 DIAGNOSIS — I10 Essential (primary) hypertension: Secondary | ICD-10-CM | POA: Diagnosis not present

## 2012-06-08 DIAGNOSIS — E559 Vitamin D deficiency, unspecified: Secondary | ICD-10-CM | POA: Diagnosis not present

## 2012-06-08 DIAGNOSIS — E269 Hyperaldosteronism, unspecified: Secondary | ICD-10-CM | POA: Diagnosis not present

## 2012-06-20 ENCOUNTER — Other Ambulatory Visit: Payer: Self-pay | Admitting: Gastroenterology

## 2012-06-20 DIAGNOSIS — R1012 Left upper quadrant pain: Secondary | ICD-10-CM

## 2012-06-21 ENCOUNTER — Other Ambulatory Visit: Payer: Medicare Other

## 2012-06-21 DIAGNOSIS — M62838 Other muscle spasm: Secondary | ICD-10-CM | POA: Diagnosis not present

## 2012-06-21 DIAGNOSIS — M5137 Other intervertebral disc degeneration, lumbosacral region: Secondary | ICD-10-CM | POA: Diagnosis not present

## 2012-06-21 DIAGNOSIS — M999 Biomechanical lesion, unspecified: Secondary | ICD-10-CM | POA: Diagnosis not present

## 2012-06-23 ENCOUNTER — Ambulatory Visit
Admission: RE | Admit: 2012-06-23 | Discharge: 2012-06-23 | Disposition: A | Discharge status: Home / Self Care | Payer: Medicare Other | Source: Ambulatory Visit | Attending: Gastroenterology | Admitting: Gastroenterology

## 2012-06-23 DIAGNOSIS — R1012 Left upper quadrant pain: Secondary | ICD-10-CM

## 2012-06-23 MED ORDER — IOHEXOL 300 MG/ML  SOLN
125.0000 mL | Freq: Once | INTRAMUSCULAR | Status: AC | PRN
  Administered 2012-06-23: 125 mL via INTRAVENOUS

## 2012-06-29 DIAGNOSIS — R51 Headache: Secondary | ICD-10-CM | POA: Diagnosis not present

## 2012-06-30 DIAGNOSIS — F39 Unspecified mood [affective] disorder: Secondary | ICD-10-CM | POA: Diagnosis not present

## 2012-07-07 DIAGNOSIS — F39 Unspecified mood [affective] disorder: Secondary | ICD-10-CM | POA: Diagnosis not present

## 2012-07-10 DIAGNOSIS — F39 Unspecified mood [affective] disorder: Secondary | ICD-10-CM | POA: Diagnosis not present

## 2012-07-13 DIAGNOSIS — F39 Unspecified mood [affective] disorder: Secondary | ICD-10-CM | POA: Diagnosis not present

## 2012-07-19 DIAGNOSIS — M999 Biomechanical lesion, unspecified: Secondary | ICD-10-CM | POA: Diagnosis not present

## 2012-07-19 DIAGNOSIS — M62838 Other muscle spasm: Secondary | ICD-10-CM | POA: Diagnosis not present

## 2012-07-19 DIAGNOSIS — M5137 Other intervertebral disc degeneration, lumbosacral region: Secondary | ICD-10-CM | POA: Diagnosis not present

## 2012-07-21 DIAGNOSIS — F39 Unspecified mood [affective] disorder: Secondary | ICD-10-CM | POA: Diagnosis not present

## 2012-07-27 DIAGNOSIS — F39 Unspecified mood [affective] disorder: Secondary | ICD-10-CM | POA: Diagnosis not present

## 2012-08-10 ENCOUNTER — Other Ambulatory Visit: Payer: Self-pay | Admitting: Internal Medicine

## 2012-08-10 DIAGNOSIS — E782 Mixed hyperlipidemia: Secondary | ICD-10-CM | POA: Diagnosis not present

## 2012-08-10 DIAGNOSIS — R911 Solitary pulmonary nodule: Secondary | ICD-10-CM | POA: Diagnosis not present

## 2012-08-10 DIAGNOSIS — F39 Unspecified mood [affective] disorder: Secondary | ICD-10-CM | POA: Diagnosis not present

## 2012-08-10 DIAGNOSIS — M899 Disorder of bone, unspecified: Secondary | ICD-10-CM | POA: Diagnosis not present

## 2012-08-10 DIAGNOSIS — F17200 Nicotine dependence, unspecified, uncomplicated: Secondary | ICD-10-CM | POA: Diagnosis not present

## 2012-08-10 DIAGNOSIS — I10 Essential (primary) hypertension: Secondary | ICD-10-CM | POA: Diagnosis not present

## 2012-08-10 DIAGNOSIS — M949 Disorder of cartilage, unspecified: Secondary | ICD-10-CM | POA: Diagnosis not present

## 2012-08-16 ENCOUNTER — Other Ambulatory Visit: Payer: Medicare Other

## 2012-08-16 DIAGNOSIS — M5137 Other intervertebral disc degeneration, lumbosacral region: Secondary | ICD-10-CM | POA: Diagnosis not present

## 2012-08-16 DIAGNOSIS — M999 Biomechanical lesion, unspecified: Secondary | ICD-10-CM | POA: Diagnosis not present

## 2012-08-16 DIAGNOSIS — M62838 Other muscle spasm: Secondary | ICD-10-CM | POA: Diagnosis not present

## 2012-08-17 DIAGNOSIS — F39 Unspecified mood [affective] disorder: Secondary | ICD-10-CM | POA: Diagnosis not present

## 2012-08-29 DIAGNOSIS — M5137 Other intervertebral disc degeneration, lumbosacral region: Secondary | ICD-10-CM | POA: Diagnosis not present

## 2012-08-29 DIAGNOSIS — M62838 Other muscle spasm: Secondary | ICD-10-CM | POA: Diagnosis not present

## 2012-08-29 DIAGNOSIS — M999 Biomechanical lesion, unspecified: Secondary | ICD-10-CM | POA: Diagnosis not present

## 2012-08-31 DIAGNOSIS — H2510 Age-related nuclear cataract, unspecified eye: Secondary | ICD-10-CM | POA: Diagnosis not present

## 2012-08-31 DIAGNOSIS — H25019 Cortical age-related cataract, unspecified eye: Secondary | ICD-10-CM | POA: Diagnosis not present

## 2012-09-01 DIAGNOSIS — M62838 Other muscle spasm: Secondary | ICD-10-CM | POA: Diagnosis not present

## 2012-09-01 DIAGNOSIS — M999 Biomechanical lesion, unspecified: Secondary | ICD-10-CM | POA: Diagnosis not present

## 2012-09-01 DIAGNOSIS — M5137 Other intervertebral disc degeneration, lumbosacral region: Secondary | ICD-10-CM | POA: Diagnosis not present

## 2012-09-13 DIAGNOSIS — M5137 Other intervertebral disc degeneration, lumbosacral region: Secondary | ICD-10-CM | POA: Diagnosis not present

## 2012-09-13 DIAGNOSIS — M999 Biomechanical lesion, unspecified: Secondary | ICD-10-CM | POA: Diagnosis not present

## 2012-09-13 DIAGNOSIS — M62838 Other muscle spasm: Secondary | ICD-10-CM | POA: Diagnosis not present

## 2012-09-14 ENCOUNTER — Other Ambulatory Visit: Payer: Self-pay | Admitting: Gastroenterology

## 2012-09-14 DIAGNOSIS — R630 Anorexia: Secondary | ICD-10-CM | POA: Diagnosis not present

## 2012-09-14 DIAGNOSIS — R112 Nausea with vomiting, unspecified: Secondary | ICD-10-CM

## 2012-09-14 DIAGNOSIS — R109 Unspecified abdominal pain: Secondary | ICD-10-CM | POA: Diagnosis not present

## 2012-09-14 DIAGNOSIS — R110 Nausea: Secondary | ICD-10-CM | POA: Diagnosis not present

## 2012-09-18 ENCOUNTER — Other Ambulatory Visit: Payer: Medicare Other

## 2012-09-21 ENCOUNTER — Ambulatory Visit
Admission: RE | Admit: 2012-09-21 | Discharge: 2012-09-21 | Disposition: A | Discharge status: Home / Self Care | Payer: Medicare Other | Source: Ambulatory Visit | Attending: Gastroenterology | Admitting: Gastroenterology

## 2012-09-21 DIAGNOSIS — R112 Nausea with vomiting, unspecified: Secondary | ICD-10-CM

## 2012-09-27 ENCOUNTER — Other Ambulatory Visit (HOSPITAL_COMMUNITY): Payer: Self-pay | Admitting: Gastroenterology

## 2012-09-27 DIAGNOSIS — K828 Other specified diseases of gallbladder: Secondary | ICD-10-CM

## 2012-09-27 DIAGNOSIS — R110 Nausea: Secondary | ICD-10-CM

## 2012-10-06 ENCOUNTER — Encounter (HOSPITAL_COMMUNITY)
Admission: RE | Admit: 2012-10-06 | Discharge: 2012-10-06 | Disposition: A | Discharge status: Home / Self Care | Payer: Medicare Other | Source: Ambulatory Visit | Attending: Gastroenterology | Admitting: Gastroenterology

## 2012-10-06 DIAGNOSIS — R110 Nausea: Secondary | ICD-10-CM | POA: Diagnosis not present

## 2012-10-06 DIAGNOSIS — K828 Other specified diseases of gallbladder: Secondary | ICD-10-CM | POA: Insufficient documentation

## 2012-10-06 MED ORDER — TECHNETIUM TC 99M MEBROFENIN IV KIT
5.0000 | PACK | Freq: Once | INTRAVENOUS | Status: AC | PRN
Start: 2012-10-06 — End: 2012-10-06

## 2012-10-10 DIAGNOSIS — L639 Alopecia areata, unspecified: Secondary | ICD-10-CM | POA: Diagnosis not present

## 2012-10-11 DIAGNOSIS — M62838 Other muscle spasm: Secondary | ICD-10-CM | POA: Diagnosis not present

## 2012-10-11 DIAGNOSIS — M5137 Other intervertebral disc degeneration, lumbosacral region: Secondary | ICD-10-CM | POA: Diagnosis not present

## 2012-10-11 DIAGNOSIS — M999 Biomechanical lesion, unspecified: Secondary | ICD-10-CM | POA: Diagnosis not present

## 2012-10-26 DIAGNOSIS — J3489 Other specified disorders of nose and nasal sinuses: Secondary | ICD-10-CM | POA: Diagnosis not present

## 2012-10-26 DIAGNOSIS — K034 Hypercementosis: Secondary | ICD-10-CM | POA: Diagnosis not present

## 2012-10-31 DIAGNOSIS — K042 Pulp degeneration: Secondary | ICD-10-CM | POA: Diagnosis not present

## 2012-10-31 DIAGNOSIS — J3489 Other specified disorders of nose and nasal sinuses: Secondary | ICD-10-CM | POA: Diagnosis not present

## 2012-10-31 DIAGNOSIS — K034 Hypercementosis: Secondary | ICD-10-CM | POA: Diagnosis not present

## 2012-11-08 DIAGNOSIS — M999 Biomechanical lesion, unspecified: Secondary | ICD-10-CM | POA: Diagnosis not present

## 2012-11-08 DIAGNOSIS — M5137 Other intervertebral disc degeneration, lumbosacral region: Secondary | ICD-10-CM | POA: Diagnosis not present

## 2012-11-08 DIAGNOSIS — M62838 Other muscle spasm: Secondary | ICD-10-CM | POA: Diagnosis not present

## 2012-11-21 DIAGNOSIS — L639 Alopecia areata, unspecified: Secondary | ICD-10-CM | POA: Diagnosis not present

## 2012-11-24 DIAGNOSIS — R110 Nausea: Secondary | ICD-10-CM | POA: Diagnosis not present

## 2012-11-24 DIAGNOSIS — R630 Anorexia: Secondary | ICD-10-CM | POA: Diagnosis not present

## 2012-12-06 DIAGNOSIS — M5430 Sciatica, unspecified side: Secondary | ICD-10-CM | POA: Diagnosis not present

## 2012-12-06 DIAGNOSIS — M5137 Other intervertebral disc degeneration, lumbosacral region: Secondary | ICD-10-CM | POA: Diagnosis not present

## 2012-12-06 DIAGNOSIS — M999 Biomechanical lesion, unspecified: Secondary | ICD-10-CM | POA: Diagnosis not present

## 2012-12-06 DIAGNOSIS — M62838 Other muscle spasm: Secondary | ICD-10-CM | POA: Diagnosis not present

## 2012-12-06 DIAGNOSIS — M4150 Other secondary scoliosis, site unspecified: Secondary | ICD-10-CM | POA: Diagnosis not present

## 2012-12-14 DIAGNOSIS — M5430 Sciatica, unspecified side: Secondary | ICD-10-CM | POA: Diagnosis not present

## 2012-12-14 DIAGNOSIS — M5137 Other intervertebral disc degeneration, lumbosacral region: Secondary | ICD-10-CM | POA: Diagnosis not present

## 2012-12-14 DIAGNOSIS — M62838 Other muscle spasm: Secondary | ICD-10-CM | POA: Diagnosis not present

## 2012-12-14 DIAGNOSIS — M999 Biomechanical lesion, unspecified: Secondary | ICD-10-CM | POA: Diagnosis not present

## 2012-12-14 DIAGNOSIS — M4150 Other secondary scoliosis, site unspecified: Secondary | ICD-10-CM | POA: Diagnosis not present

## 2013-01-03 DIAGNOSIS — M4150 Other secondary scoliosis, site unspecified: Secondary | ICD-10-CM | POA: Diagnosis not present

## 2013-01-03 DIAGNOSIS — M999 Biomechanical lesion, unspecified: Secondary | ICD-10-CM | POA: Diagnosis not present

## 2013-01-03 DIAGNOSIS — M5430 Sciatica, unspecified side: Secondary | ICD-10-CM | POA: Diagnosis not present

## 2013-01-03 DIAGNOSIS — M5137 Other intervertebral disc degeneration, lumbosacral region: Secondary | ICD-10-CM | POA: Diagnosis not present

## 2013-01-03 DIAGNOSIS — M62838 Other muscle spasm: Secondary | ICD-10-CM | POA: Diagnosis not present

## 2013-01-09 DIAGNOSIS — M5430 Sciatica, unspecified side: Secondary | ICD-10-CM | POA: Diagnosis not present

## 2013-01-09 DIAGNOSIS — M999 Biomechanical lesion, unspecified: Secondary | ICD-10-CM | POA: Diagnosis not present

## 2013-01-09 DIAGNOSIS — M62838 Other muscle spasm: Secondary | ICD-10-CM | POA: Diagnosis not present

## 2013-01-09 DIAGNOSIS — M5137 Other intervertebral disc degeneration, lumbosacral region: Secondary | ICD-10-CM | POA: Diagnosis not present

## 2013-01-09 DIAGNOSIS — M4150 Other secondary scoliosis, site unspecified: Secondary | ICD-10-CM | POA: Diagnosis not present

## 2013-02-05 DIAGNOSIS — L639 Alopecia areata, unspecified: Secondary | ICD-10-CM | POA: Diagnosis not present

## 2013-02-08 DIAGNOSIS — M62838 Other muscle spasm: Secondary | ICD-10-CM | POA: Diagnosis not present

## 2013-02-08 DIAGNOSIS — M999 Biomechanical lesion, unspecified: Secondary | ICD-10-CM | POA: Diagnosis not present

## 2013-02-08 DIAGNOSIS — M5430 Sciatica, unspecified side: Secondary | ICD-10-CM | POA: Diagnosis not present

## 2013-02-08 DIAGNOSIS — M4150 Other secondary scoliosis, site unspecified: Secondary | ICD-10-CM | POA: Diagnosis not present

## 2013-02-08 DIAGNOSIS — M5137 Other intervertebral disc degeneration, lumbosacral region: Secondary | ICD-10-CM | POA: Diagnosis not present

## 2013-03-05 DIAGNOSIS — L639 Alopecia areata, unspecified: Secondary | ICD-10-CM | POA: Diagnosis not present

## 2013-03-07 DIAGNOSIS — M62838 Other muscle spasm: Secondary | ICD-10-CM | POA: Diagnosis not present

## 2013-03-07 DIAGNOSIS — M5137 Other intervertebral disc degeneration, lumbosacral region: Secondary | ICD-10-CM | POA: Diagnosis not present

## 2013-03-07 DIAGNOSIS — M999 Biomechanical lesion, unspecified: Secondary | ICD-10-CM | POA: Diagnosis not present

## 2013-03-07 DIAGNOSIS — M4150 Other secondary scoliosis, site unspecified: Secondary | ICD-10-CM | POA: Diagnosis not present

## 2013-03-07 DIAGNOSIS — M5430 Sciatica, unspecified side: Secondary | ICD-10-CM | POA: Diagnosis not present

## 2013-04-06 ENCOUNTER — Ambulatory Visit: Payer: Self-pay | Admitting: Podiatrist

## 2013-04-07 DIAGNOSIS — M25579 Pain in unspecified ankle and joints of unspecified foot: Secondary | ICD-10-CM | POA: Diagnosis not present

## 2013-04-07 DIAGNOSIS — M25559 Pain in unspecified hip: Secondary | ICD-10-CM | POA: Diagnosis not present

## 2013-04-09 DIAGNOSIS — M25579 Pain in unspecified ankle and joints of unspecified foot: Secondary | ICD-10-CM | POA: Diagnosis not present

## 2013-04-12 DIAGNOSIS — M62838 Other muscle spasm: Secondary | ICD-10-CM | POA: Diagnosis not present

## 2013-04-12 DIAGNOSIS — M999 Biomechanical lesion, unspecified: Secondary | ICD-10-CM | POA: Diagnosis not present

## 2013-04-12 DIAGNOSIS — M4150 Other secondary scoliosis, site unspecified: Secondary | ICD-10-CM | POA: Diagnosis not present

## 2013-04-12 DIAGNOSIS — M5137 Other intervertebral disc degeneration, lumbosacral region: Secondary | ICD-10-CM | POA: Diagnosis not present

## 2013-04-12 DIAGNOSIS — M5430 Sciatica, unspecified side: Secondary | ICD-10-CM | POA: Diagnosis not present

## 2013-04-17 DIAGNOSIS — Z111 Encounter for screening for respiratory tuberculosis: Secondary | ICD-10-CM | POA: Diagnosis not present

## 2013-05-07 DIAGNOSIS — M25579 Pain in unspecified ankle and joints of unspecified foot: Secondary | ICD-10-CM | POA: Diagnosis not present

## 2013-05-09 DIAGNOSIS — M999 Biomechanical lesion, unspecified: Secondary | ICD-10-CM | POA: Diagnosis not present

## 2013-05-09 DIAGNOSIS — M62838 Other muscle spasm: Secondary | ICD-10-CM | POA: Diagnosis not present

## 2013-05-09 DIAGNOSIS — M4150 Other secondary scoliosis, site unspecified: Secondary | ICD-10-CM | POA: Diagnosis not present

## 2013-05-09 DIAGNOSIS — M5430 Sciatica, unspecified side: Secondary | ICD-10-CM | POA: Diagnosis not present

## 2013-05-09 DIAGNOSIS — M5137 Other intervertebral disc degeneration, lumbosacral region: Secondary | ICD-10-CM | POA: Diagnosis not present

## 2013-06-05 DIAGNOSIS — M62838 Other muscle spasm: Secondary | ICD-10-CM | POA: Diagnosis not present

## 2013-06-05 DIAGNOSIS — M5137 Other intervertebral disc degeneration, lumbosacral region: Secondary | ICD-10-CM | POA: Diagnosis not present

## 2013-06-05 DIAGNOSIS — M999 Biomechanical lesion, unspecified: Secondary | ICD-10-CM | POA: Diagnosis not present

## 2013-06-05 DIAGNOSIS — M5430 Sciatica, unspecified side: Secondary | ICD-10-CM | POA: Diagnosis not present

## 2013-06-05 DIAGNOSIS — M4150 Other secondary scoliosis, site unspecified: Secondary | ICD-10-CM | POA: Diagnosis not present

## 2013-06-25 DIAGNOSIS — E785 Hyperlipidemia, unspecified: Secondary | ICD-10-CM | POA: Diagnosis not present

## 2013-06-25 DIAGNOSIS — I10 Essential (primary) hypertension: Secondary | ICD-10-CM | POA: Diagnosis not present

## 2013-06-25 DIAGNOSIS — E559 Vitamin D deficiency, unspecified: Secondary | ICD-10-CM | POA: Diagnosis not present

## 2013-06-25 DIAGNOSIS — R7301 Impaired fasting glucose: Secondary | ICD-10-CM | POA: Diagnosis not present

## 2013-06-27 DIAGNOSIS — I10 Essential (primary) hypertension: Secondary | ICD-10-CM | POA: Diagnosis not present

## 2013-06-27 DIAGNOSIS — R7301 Impaired fasting glucose: Secondary | ICD-10-CM | POA: Diagnosis not present

## 2013-06-27 DIAGNOSIS — M949 Disorder of cartilage, unspecified: Secondary | ICD-10-CM | POA: Diagnosis not present

## 2013-06-27 DIAGNOSIS — E785 Hyperlipidemia, unspecified: Secondary | ICD-10-CM | POA: Diagnosis not present

## 2013-06-27 DIAGNOSIS — R7309 Other abnormal glucose: Secondary | ICD-10-CM | POA: Diagnosis not present

## 2013-06-27 DIAGNOSIS — E269 Hyperaldosteronism, unspecified: Secondary | ICD-10-CM | POA: Diagnosis not present

## 2013-06-27 DIAGNOSIS — E559 Vitamin D deficiency, unspecified: Secondary | ICD-10-CM | POA: Diagnosis not present

## 2013-06-27 DIAGNOSIS — F17200 Nicotine dependence, unspecified, uncomplicated: Secondary | ICD-10-CM | POA: Diagnosis not present

## 2013-06-27 DIAGNOSIS — M899 Disorder of bone, unspecified: Secondary | ICD-10-CM | POA: Diagnosis not present

## 2013-07-03 DIAGNOSIS — L639 Alopecia areata, unspecified: Secondary | ICD-10-CM | POA: Diagnosis not present

## 2013-07-04 DIAGNOSIS — M5137 Other intervertebral disc degeneration, lumbosacral region: Secondary | ICD-10-CM | POA: Diagnosis not present

## 2013-07-04 DIAGNOSIS — M4150 Other secondary scoliosis, site unspecified: Secondary | ICD-10-CM | POA: Diagnosis not present

## 2013-07-04 DIAGNOSIS — M5430 Sciatica, unspecified side: Secondary | ICD-10-CM | POA: Diagnosis not present

## 2013-07-04 DIAGNOSIS — M62838 Other muscle spasm: Secondary | ICD-10-CM | POA: Diagnosis not present

## 2013-07-04 DIAGNOSIS — M999 Biomechanical lesion, unspecified: Secondary | ICD-10-CM | POA: Diagnosis not present

## 2013-07-31 DIAGNOSIS — L639 Alopecia areata, unspecified: Secondary | ICD-10-CM | POA: Diagnosis not present

## 2013-08-01 DIAGNOSIS — M999 Biomechanical lesion, unspecified: Secondary | ICD-10-CM | POA: Diagnosis not present

## 2013-08-01 DIAGNOSIS — M62838 Other muscle spasm: Secondary | ICD-10-CM | POA: Diagnosis not present

## 2013-08-01 DIAGNOSIS — M5137 Other intervertebral disc degeneration, lumbosacral region: Secondary | ICD-10-CM | POA: Diagnosis not present

## 2013-08-22 DIAGNOSIS — M999 Biomechanical lesion, unspecified: Secondary | ICD-10-CM | POA: Diagnosis not present

## 2013-08-22 DIAGNOSIS — M5137 Other intervertebral disc degeneration, lumbosacral region: Secondary | ICD-10-CM | POA: Diagnosis not present

## 2013-08-22 DIAGNOSIS — M62838 Other muscle spasm: Secondary | ICD-10-CM | POA: Diagnosis not present

## 2013-08-29 DIAGNOSIS — M62838 Other muscle spasm: Secondary | ICD-10-CM | POA: Diagnosis not present

## 2013-08-29 DIAGNOSIS — M5137 Other intervertebral disc degeneration, lumbosacral region: Secondary | ICD-10-CM | POA: Diagnosis not present

## 2013-08-29 DIAGNOSIS — M999 Biomechanical lesion, unspecified: Secondary | ICD-10-CM | POA: Diagnosis not present

## 2013-09-05 ENCOUNTER — Ambulatory Visit: Payer: Medicare Other

## 2013-09-06 ENCOUNTER — Ambulatory Visit (INDEPENDENT_AMBULATORY_CARE_PROVIDER_SITE_OTHER): Payer: Medicare Other | Admitting: Family Medicine

## 2013-09-06 ENCOUNTER — Ambulatory Visit (INDEPENDENT_AMBULATORY_CARE_PROVIDER_SITE_OTHER): Payer: Medicare Other

## 2013-09-06 ENCOUNTER — Encounter: Payer: Self-pay | Admitting: Family Medicine

## 2013-09-06 VITALS — BP 111/77 | HR 82 | Temp 98.9°F | Resp 18 | Ht 63.0 in | Wt 142.0 lb

## 2013-09-06 DIAGNOSIS — R5381 Other malaise: Secondary | ICD-10-CM

## 2013-09-06 DIAGNOSIS — R51 Headache: Secondary | ICD-10-CM

## 2013-09-06 DIAGNOSIS — Z720 Tobacco use: Secondary | ICD-10-CM

## 2013-09-06 DIAGNOSIS — R519 Headache, unspecified: Secondary | ICD-10-CM

## 2013-09-06 DIAGNOSIS — R079 Chest pain, unspecified: Secondary | ICD-10-CM | POA: Diagnosis not present

## 2013-09-06 DIAGNOSIS — Z6379 Other stressful life events affecting family and household: Secondary | ICD-10-CM

## 2013-09-06 DIAGNOSIS — F17200 Nicotine dependence, unspecified, uncomplicated: Secondary | ICD-10-CM

## 2013-09-06 DIAGNOSIS — Z636 Dependent relative needing care at home: Secondary | ICD-10-CM

## 2013-09-06 DIAGNOSIS — R42 Dizziness and giddiness: Secondary | ICD-10-CM | POA: Diagnosis not present

## 2013-09-06 DIAGNOSIS — R5383 Other fatigue: Secondary | ICD-10-CM

## 2013-09-06 MED ORDER — ALBUTEROL SULFATE HFA 108 (90 BASE) MCG/ACT IN AERS: 2.0000 | INHALATION_SPRAY | Freq: Four times a day (QID) | RESPIRATORY_TRACT | Status: DC | PRN

## 2013-09-06 NOTE — Progress Notes (Signed)
Subjective:    Patient ID: Jenny Oliver, female    DOB: 1946-01-01, 68 y.o.   MRN: [id number]  HPI  This 68 y.o. AA female is here for evaluation of several problems that are chronic in nature. She presented to [facility] yesterday but left after waiting 3 hours. Pt has PCP at another practice but is scheduled to establish w/ a new physician next month.  She states she has been having intermittent anterior CP and SOB with dizziness for several weeks/months. She is a heavy smoker and has sore throat and thinks lymph nodes are swollen. She has HAs several times a week, located in frontal area and in occiput. APAP 2 tablets relieves the HA. She takes 1 dose daily, not repeating dose when HA returns. Pt feels tired and feels off balance/gait problem.  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 Pt is feeling overwhelmed and seldom has time for herself.  Patient Active Problem List   Diagnosis Date Noted  . Tobacco user 09/08/2013  . Allergic rhinitis 09/08/2013  . Lipid disorder 09/08/2013  . Dyspepsia and disorder of function of stomach 09/08/2013    Prior to Admission medications   Medication Sig Start Date End Date Taking? Authorizing Provider  acetaminophen (TYLENOL) 650 MG CR tablet Take 650 mg by mouth daily.   Yes Historical Provider, MD  aMILoride (MIDAMOR) 5 MG tablet Take 5 mg by mouth daily.   Yes Historical Provider, MD  antiseptic oral rinse (BIOTENE) LIQD 15 mLs by Mouth Rinse route as needed for dry mouth.   Yes Historical Provider, MD  cetirizine (ZYRTEC) 10 MG tablet Take 1 tablet (10 mg total) by mouth daily. 05/31/11 09/06/13 Yes Wendie Agreste, MD  co-enzyme Q-10 30 MG capsule Take by mouth daily.   Yes Historical Provider, MD  Cobalamine Combinations (FOLIC + B84) 665-9935 MCG TABS Take by mouth daily.   Yes Historical Provider, MD  Cyanocobalamin (B-12) 1000 MCG CAPS Take by mouth daily.   Yes Historical Provider, MD    fluocinonide-emollient (LIDEX-E) 0.05 % cream Apply top to affected area up to twice per day as needed for itching. 05/31/11  Yes Wendie Agreste, MD  glucosamine-chondroitin 500-400 MG tablet Take 1 tablet by mouth daily.   Yes Historical Provider, MD  magnesium 30 MG tablet Take by mouth daily.   Yes Historical Provider, MD  Multiple Vitamin (MULTIVITAMIN) capsule Take 1 capsule by mouth daily.   Yes Historical Provider, MD  Potassium 75 MG TABS Take by mouth.   Yes Historical Provider, MD  pravastatin (PRAVACHOL) 80 MG tablet Take 80 mg by mouth daily.   Yes Historical Provider, MD  Probiotic Product (PROBIOTIC DAILY PO) Take by mouth 2 (two) times daily.   Yes Historical Provider, MD  Riboflavin (VITAMIN B-2) 25 MG TABS Take by mouth daily.   Yes Historical Provider, MD  VITAMIN D, CHOLECALCIFEROL, PO Take 200 mg by mouth 4 (four) times daily.   Yes Historical Provider, MD  omeprazole (PRILOSEC) 20 MG capsule Take 20 mg by mouth as needed.    Historical Provider, MD   PMHx, Surg Hx, Soc and Fam Hx reviewed.   Review of Systems  Constitutional: Positive for appetite change and fatigue. Negative for fever, chills, diaphoresis and unexpected weight change.  HENT: Positive for postnasal drip and sore throat. Negative for congestion, ear pain, rhinorrhea and trouble swallowing.   Eyes: Negative.   Respiratory: Positive for cough and shortness  of breath. Negative for chest tightness and wheezing.   Cardiovascular: Positive for chest pain. Negative for palpitations and leg swelling.  Gastrointestinal: Negative.   Endocrine: Negative.   Musculoskeletal: Positive for gait problem. Negative for arthralgias, back pain and myalgias.  Skin: Negative.   Allergic/Immunologic: Positive for environmental allergies.  Neurological: Positive for dizziness and headaches. Negative for syncope, speech difficulty, weakness, light-headedness and numbness.  Psychiatric/Behavioral: Positive for sleep  disturbance. Negative for dysphoric mood, decreased concentration and agitation. The patient is nervous/anxious.       Objective:   Physical Exam  Nursing note and vitals reviewed. Constitutional: She is oriented to person, place, and time. She appears well-developed and well-nourished. No distress.  HENT:  Head: Normocephalic and atraumatic.  Right Ear: External ear normal.  Left Ear: External ear normal.  Nose: Nose normal.  Mouth/Throat: Oropharynx is clear and moist. No oropharyngeal exudate.  Eyes: Conjunctivae and EOM are normal. Pupils are equal, round, and reactive to light. No scleral icterus.  Neck: Normal range of motion. Neck supple. No JVD present. No thyromegaly present.  Cardiovascular: Normal rate, regular rhythm, S1 normal, S2 normal, normal heart sounds and intact distal pulses.   No extrasystoles are present. PMI is not displaced.  Exam reveals no gallop and no friction rub.   No murmur heard. Pulmonary/Chest: Effort normal and breath sounds normal. No respiratory distress. She has no wheezes. She has no rales.  Abdominal: Soft. Bowel sounds are normal. She exhibits no distension and no mass. There is no tenderness. There is no guarding.  Musculoskeletal: Normal range of motion. She exhibits no edema and no tenderness.  Lymphadenopathy:    She has no cervical adenopathy.  Neurological: She is alert and oriented to person, place, and time. No cranial nerve deficit. She exhibits normal muscle tone. Coordination normal.  Reflex Scores:      Tricep reflexes are 2+ on the right side and 2+ on the left side.      Bicep reflexes are 2+ on the right side and 2+ on the left side.      Brachioradialis reflexes are 2+ on the right side and 2+ on the left side.      Patellar reflexes are 2+ on the right side and 2+ on the left side. Skin: Skin is warm, dry and intact. No rash noted. She is not diaphoretic. No cyanosis or erythema.  Psychiatric: Her speech is normal. Judgment and  thought content normal. Her mood appears not anxious. Her affect is not inappropriate. She is slowed. She is not withdrawn. Cognition and memory are normal. She exhibits a depressed mood.  Flat affect. She is attentive.    ECG: NSR; no ST-TW changes. No ectopy.  [facility] reading (PRIMARY) by  Dr. Leward Quan: CXR-  Scalloped scarring at left base with questionable patchy infiltrate (mass versus nipple shadow). Heart size normal. Osteopenia in thoracic spine.     Assessment & Plan:  Chest pain, unspecified - Plan: EKG 12-Lead, DG Chest 2 View, Comprehensive metabolic panel, Thyroid Panel With TSH, CBC with Differential  Tobacco user - Plan: DG Chest 2 View; encouraged pt to consider cutting back.  Headache disorder- Stress related as well as due to tobacco user and allergic rhinitis.  Other malaise and fatigue- As above and due to caregiver stress.  Caregiver stress- Pt agrees that this is probably the core issue that is causing her to feel "unwell", tired and overwhelmed. Pt working on increasing support for in-home care.  Medications  . albuterol (  PROVENTIL HFA;VENTOLIN HFA) 108 (90 BASE) MCG/ACT inhaler    Sig: Inhale 2 puffs into the lungs every 6 (six) hours as needed for wheezing or shortness of breath.    Dispense:  1 Inhaler    Refill:  2   Pt given a copy of ECG and advised to take copy of labs to new physician visit; she will be mailed a copy of labs once resulted. She understands.

## 2013-09-06 NOTE — Patient Instructions (Addendum)
I will contact you about your labs and the official reading of your chest xray. Please provide the information to your new physician next month. I suspect you may be experiencing "caregiver stress" as well as long term effects of chronic smoking.  I am prescribing an inhaler to help with tightness and shortness of breath.

## 2013-09-07 ENCOUNTER — Telehealth: Payer: Self-pay | Admitting: Family Medicine

## 2013-09-07 LAB — CBC WITH DIFFERENTIAL/PLATELET
BASOS ABS: 0 10*3/uL (ref 0.0–0.1)
BASOS PCT: 0 % (ref 0–1)
EOS ABS: 0 10*3/uL (ref 0.0–0.7)
EOS PCT: 0 % (ref 0–5)
HCT: 42.5 % (ref 36.0–46.0)
HEMOGLOBIN: 15 g/dL (ref 12.0–15.0)
Lymphocytes Relative: 59 % — ABNORMAL HIGH (ref 12–46)
Lymphs Abs: 2.8 10*3/uL (ref 0.7–4.0)
MCH: 33.8 pg (ref 26.0–34.0)
MCHC: 35.3 g/dL (ref 30.0–36.0)
MCV: 95.7 fL (ref 78.0–100.0)
MONO ABS: 0.2 10*3/uL (ref 0.1–1.0)
Monocytes Relative: 5 % (ref 3–12)
NEUTROS PCT: 36 % — AB (ref 43–77)
Neutro Abs: 1.7 10*3/uL (ref 1.7–7.7)
Platelets: 317 10*3/uL (ref 150–400)
RBC: 4.44 MIL/uL (ref 3.87–5.11)
RDW: 13 % (ref 11.5–15.5)
WBC: 4.7 10*3/uL (ref 4.0–10.5)

## 2013-09-07 LAB — COMPREHENSIVE METABOLIC PANEL
ALK PHOS: 67 U/L (ref 39–117)
ALT: 21 U/L (ref 0–35)
AST: 18 U/L (ref 0–37)
Albumin: 4.7 g/dL (ref 3.5–5.2)
BILIRUBIN TOTAL: 1 mg/dL (ref 0.2–1.2)
BUN: 14 mg/dL (ref 6–23)
CO2: 28 mEq/L (ref 19–32)
Calcium: 10.4 mg/dL (ref 8.4–10.5)
Chloride: 103 mEq/L (ref 96–112)
Creat: 0.86 mg/dL (ref 0.50–1.10)
Glucose, Bld: 96 mg/dL (ref 70–99)
POTASSIUM: 4.9 meq/L (ref 3.5–5.3)
Sodium: 140 mEq/L (ref 135–145)
Total Protein: 6.8 g/dL (ref 6.0–8.3)

## 2013-09-07 LAB — THYROID PANEL WITH TSH
FREE THYROXINE INDEX: 3 (ref 1.0–3.9)
T3 Uptake: 29 % (ref 22.5–37.0)
T4, Total: 10.5 ug/dL (ref 5.0–12.5)
TSH: 0.766 u[IU]/mL (ref 0.350–4.500)

## 2013-09-07 NOTE — Telephone Encounter (Signed)
Phoned pt and left message about CXR; some scarring or mild abnormality at L base. No other significant abnormalities seen. Advised pt that her new PCP may want report and we can provide that if needed. This is of importance given pt's smoking hx and continued heavy tobacco use.

## 2013-09-07 NOTE — Progress Notes (Signed)
Quick Note:  Please notify pt that results are normal.   Provide pt with copy of labs. ______ 

## 2013-09-08 ENCOUNTER — Encounter: Payer: Self-pay | Admitting: Radiology

## 2013-09-08 DIAGNOSIS — J309 Allergic rhinitis, unspecified: Secondary | ICD-10-CM | POA: Insufficient documentation

## 2013-09-08 DIAGNOSIS — Z636 Dependent relative needing care at home: Secondary | ICD-10-CM | POA: Insufficient documentation

## 2013-09-08 DIAGNOSIS — K319 Disease of stomach and duodenum, unspecified: Secondary | ICD-10-CM | POA: Insufficient documentation

## 2013-09-08 DIAGNOSIS — K219 Gastro-esophageal reflux disease without esophagitis: Secondary | ICD-10-CM | POA: Insufficient documentation

## 2013-09-08 DIAGNOSIS — E789 Disorder of lipoprotein metabolism, unspecified: Secondary | ICD-10-CM | POA: Insufficient documentation

## 2013-09-08 DIAGNOSIS — Z720 Tobacco use: Secondary | ICD-10-CM | POA: Insufficient documentation

## 2013-09-08 DIAGNOSIS — R1013 Epigastric pain: Secondary | ICD-10-CM

## 2013-09-12 DIAGNOSIS — M62838 Other muscle spasm: Secondary | ICD-10-CM | POA: Diagnosis not present

## 2013-09-12 DIAGNOSIS — M5137 Other intervertebral disc degeneration, lumbosacral region: Secondary | ICD-10-CM | POA: Diagnosis not present

## 2013-09-12 DIAGNOSIS — M999 Biomechanical lesion, unspecified: Secondary | ICD-10-CM | POA: Diagnosis not present

## 2013-09-26 DIAGNOSIS — M999 Biomechanical lesion, unspecified: Secondary | ICD-10-CM | POA: Diagnosis not present

## 2013-09-26 DIAGNOSIS — M5137 Other intervertebral disc degeneration, lumbosacral region: Secondary | ICD-10-CM | POA: Diagnosis not present

## 2013-09-26 DIAGNOSIS — M62838 Other muscle spasm: Secondary | ICD-10-CM | POA: Diagnosis not present

## 2013-10-14 IMAGING — CT CT ABD-PELV W/ CM
2 of 5 series · 14 of 32 positions shown, 19 images · IV contrast (READICAT/WATER & [ID] OMNI 300)
Comparison: None.

CLINICAL DATA: Left upper quadrant abdominal pain for 6-10 months.

CT ABDOMEN AND PELVIS WITH CONTRAST
TECHNIQUE: Multidetector CT imaging of the abdomen and pelvis was
performed following the standard protocol during bolus
administration of intravenous contrast.
Contrast: 125mL OMNIPAQUE IOHEXOL 300 MG/ML  SOLN

[Series 2: abd/pelvis with · axial · 0.70mm/px · z∈[-350,-75]mm · 6 of 79 slices shown, 11 images]
[im 12/79  soft-tissue]
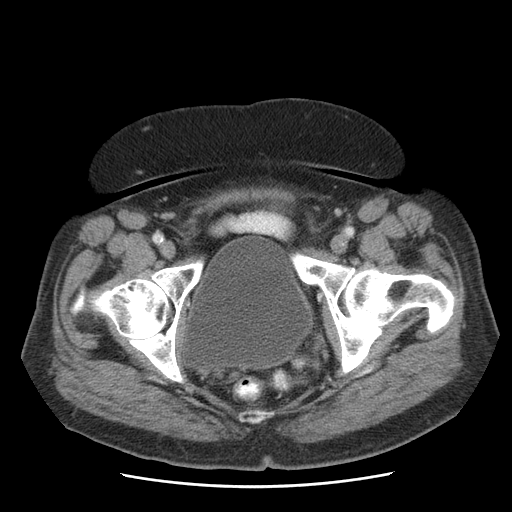
[im 12/79  bone]
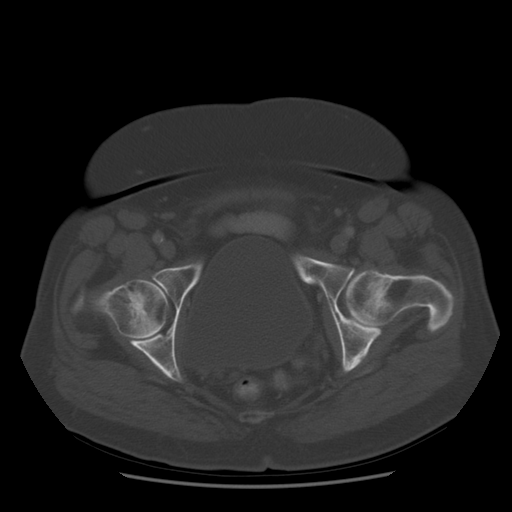
[im 23/79  soft-tissue]
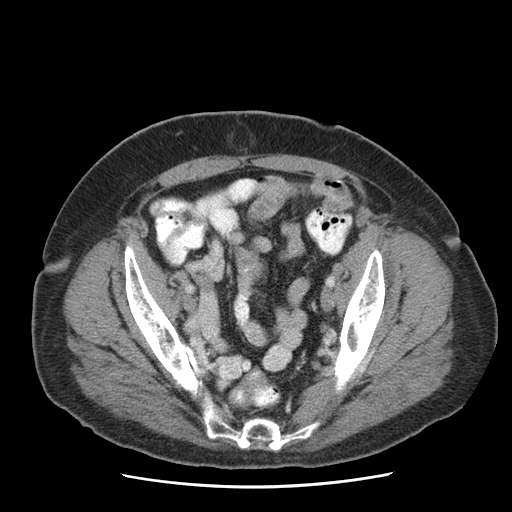
[im 34/79  soft-tissue]
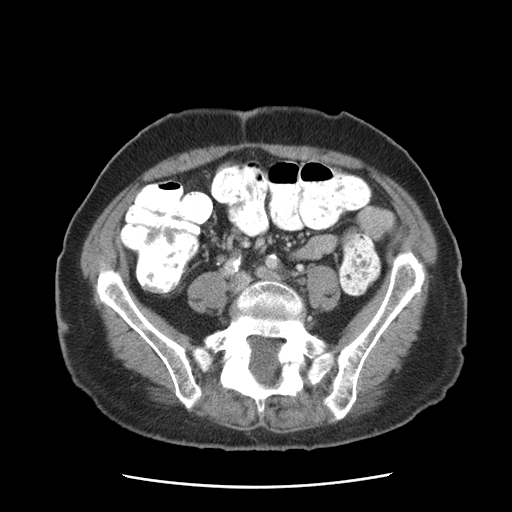
[im 34/79  lung]
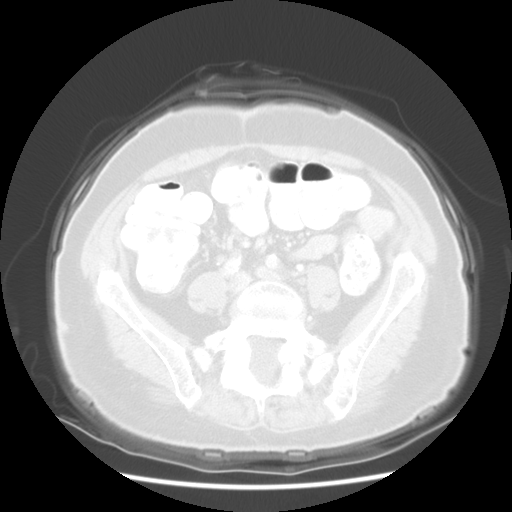
[im 45/79  soft-tissue]
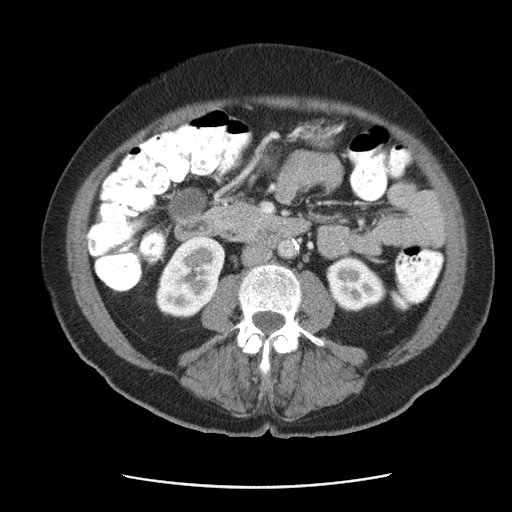
[im 45/79  lung]
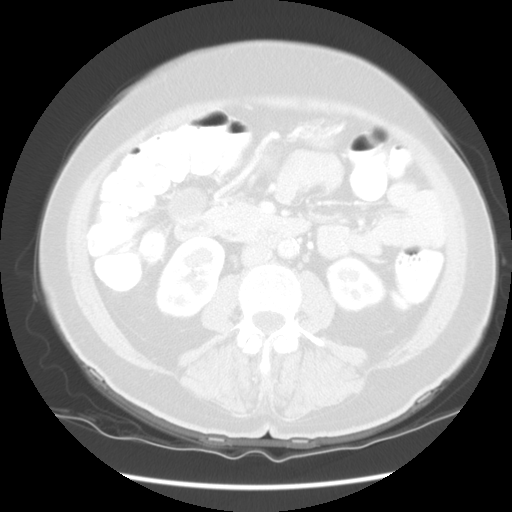
[im 56/79  soft-tissue]
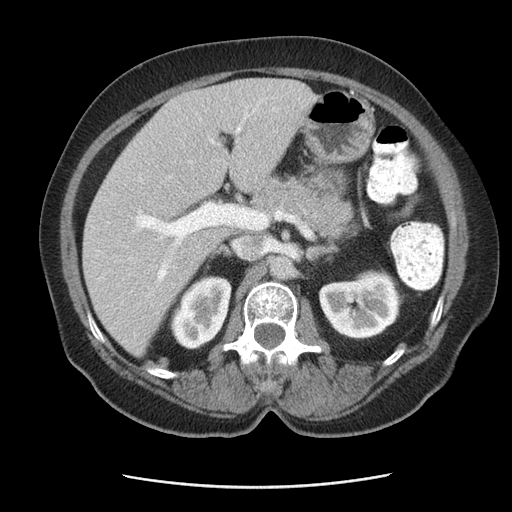
[im 56/79  lung]
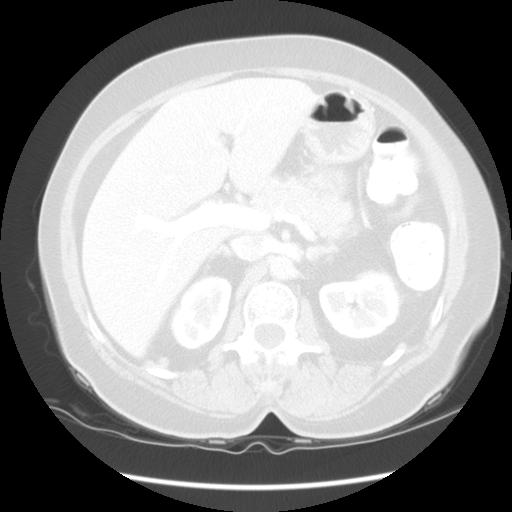
[im 67/79  soft-tissue]
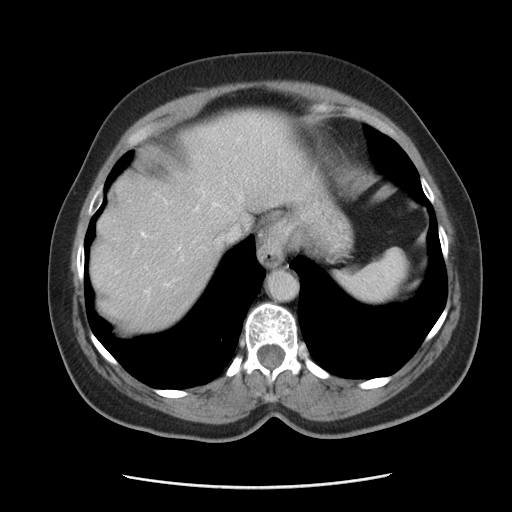
[im 67/79  lung]
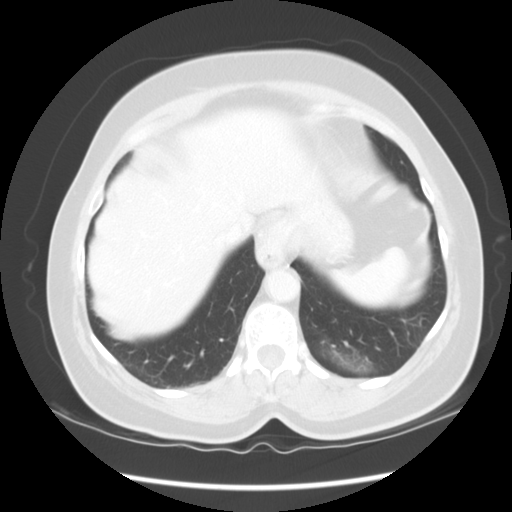

[Series 400: sag · sagittal · 0.79mm/px · 8 of 115 slices shown]
[im 13/115  soft-tissue]
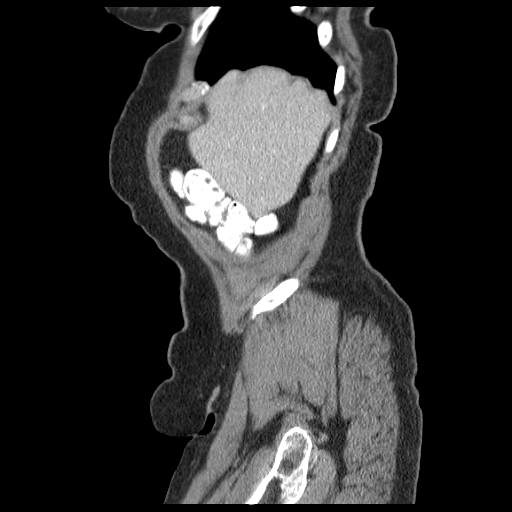
[im 26/115  soft-tissue]
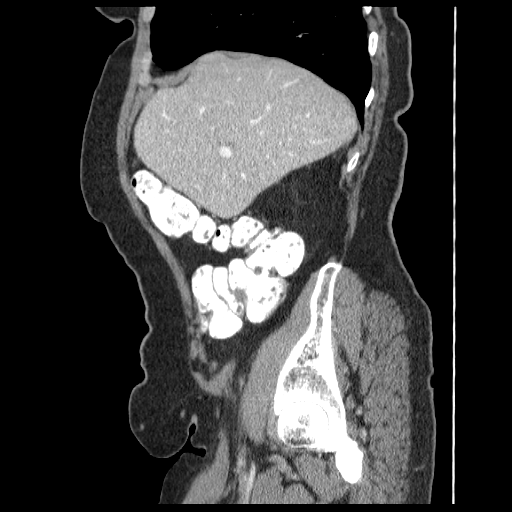
[im 39/115  soft-tissue]
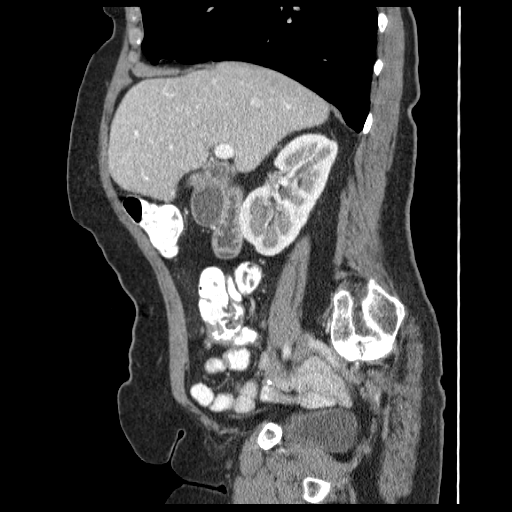
[im 51/115  soft-tissue]
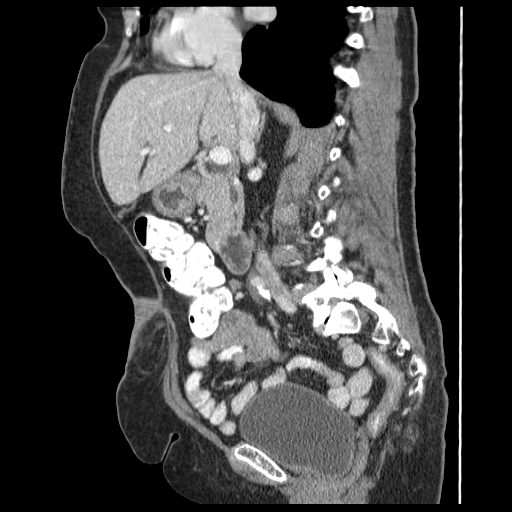
[im 64/115  soft-tissue]
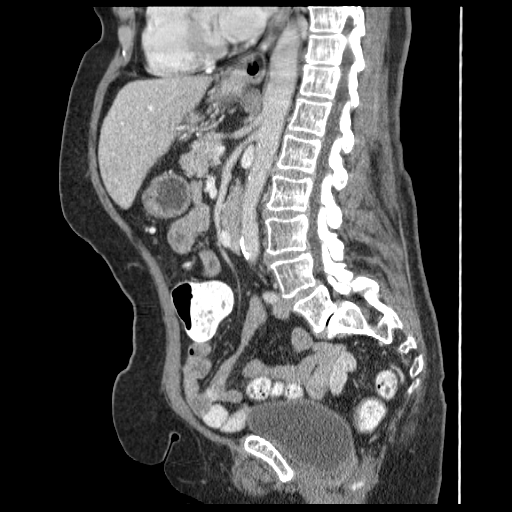
[im 77/115  soft-tissue]
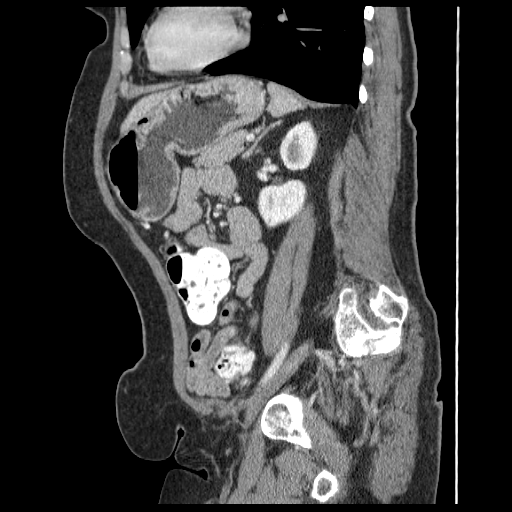
[im 89/115  soft-tissue]
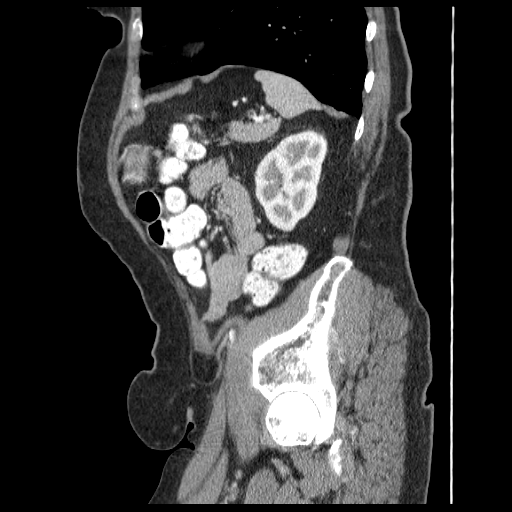
[im 102/115  soft-tissue]
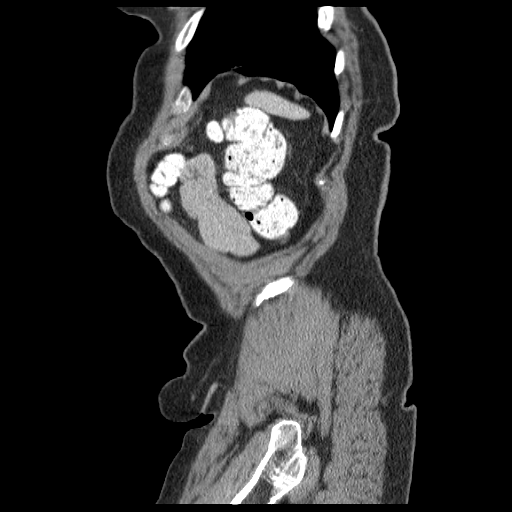

[14 of 32 positions shown; findings below may reference images not displayed]

FINDINGS: There is a small hiatal hernia.  The liver, spleen,
pancreas, adrenal glands, and kidneys are normal.  The bowel is
normal including the terminal ileum and appendix.  No diverticular
disease.

The uterus and left ovary appear to have been removed.  Right ovary
is normal.  No acute osseous abnormality.  There is a grade 1
spondylolisthesis of L5 on S1 secondary to bilateral pars defects.
IMPRESSION: Benign-appearing abdomen and pelvis.

## 2013-10-24 DIAGNOSIS — M5137 Other intervertebral disc degeneration, lumbosacral region: Secondary | ICD-10-CM | POA: Diagnosis not present

## 2013-10-24 DIAGNOSIS — M62838 Other muscle spasm: Secondary | ICD-10-CM | POA: Diagnosis not present

## 2013-10-24 DIAGNOSIS — M999 Biomechanical lesion, unspecified: Secondary | ICD-10-CM | POA: Diagnosis not present

## 2013-11-12 DIAGNOSIS — M62838 Other muscle spasm: Secondary | ICD-10-CM | POA: Diagnosis not present

## 2013-11-12 DIAGNOSIS — M5137 Other intervertebral disc degeneration, lumbosacral region: Secondary | ICD-10-CM | POA: Diagnosis not present

## 2013-11-12 DIAGNOSIS — M999 Biomechanical lesion, unspecified: Secondary | ICD-10-CM | POA: Diagnosis not present

## 2013-11-19 DIAGNOSIS — J309 Allergic rhinitis, unspecified: Secondary | ICD-10-CM | POA: Diagnosis not present

## 2013-11-19 DIAGNOSIS — J449 Chronic obstructive pulmonary disease, unspecified: Secondary | ICD-10-CM | POA: Diagnosis not present

## 2013-11-19 DIAGNOSIS — K219 Gastro-esophageal reflux disease without esophagitis: Secondary | ICD-10-CM | POA: Diagnosis not present

## 2013-11-19 DIAGNOSIS — E785 Hyperlipidemia, unspecified: Secondary | ICD-10-CM | POA: Diagnosis not present

## 2013-11-21 DIAGNOSIS — M5137 Other intervertebral disc degeneration, lumbosacral region: Secondary | ICD-10-CM | POA: Diagnosis not present

## 2013-11-21 DIAGNOSIS — M999 Biomechanical lesion, unspecified: Secondary | ICD-10-CM | POA: Diagnosis not present

## 2013-11-21 DIAGNOSIS — M62838 Other muscle spasm: Secondary | ICD-10-CM | POA: Diagnosis not present

## 2013-11-27 DIAGNOSIS — L820 Inflamed seborrheic keratosis: Secondary | ICD-10-CM | POA: Diagnosis not present

## 2013-12-19 DIAGNOSIS — M5137 Other intervertebral disc degeneration, lumbosacral region: Secondary | ICD-10-CM | POA: Diagnosis not present

## 2013-12-19 DIAGNOSIS — M999 Biomechanical lesion, unspecified: Secondary | ICD-10-CM | POA: Diagnosis not present

## 2013-12-19 DIAGNOSIS — M62838 Other muscle spasm: Secondary | ICD-10-CM | POA: Diagnosis not present

## 2013-12-24 DIAGNOSIS — E785 Hyperlipidemia, unspecified: Secondary | ICD-10-CM | POA: Diagnosis not present

## 2013-12-24 DIAGNOSIS — R7301 Impaired fasting glucose: Secondary | ICD-10-CM | POA: Diagnosis not present

## 2013-12-24 DIAGNOSIS — E559 Vitamin D deficiency, unspecified: Secondary | ICD-10-CM | POA: Diagnosis not present

## 2013-12-24 DIAGNOSIS — I10 Essential (primary) hypertension: Secondary | ICD-10-CM | POA: Diagnosis not present

## 2013-12-26 DIAGNOSIS — I10 Essential (primary) hypertension: Secondary | ICD-10-CM | POA: Diagnosis not present

## 2013-12-26 DIAGNOSIS — M949 Disorder of cartilage, unspecified: Secondary | ICD-10-CM | POA: Diagnosis not present

## 2013-12-26 DIAGNOSIS — E269 Hyperaldosteronism, unspecified: Secondary | ICD-10-CM | POA: Diagnosis not present

## 2013-12-26 DIAGNOSIS — M899 Disorder of bone, unspecified: Secondary | ICD-10-CM | POA: Diagnosis not present

## 2013-12-26 DIAGNOSIS — E559 Vitamin D deficiency, unspecified: Secondary | ICD-10-CM | POA: Diagnosis not present

## 2013-12-26 DIAGNOSIS — R7301 Impaired fasting glucose: Secondary | ICD-10-CM | POA: Diagnosis not present

## 2013-12-26 DIAGNOSIS — F17200 Nicotine dependence, unspecified, uncomplicated: Secondary | ICD-10-CM | POA: Diagnosis not present

## 2013-12-26 DIAGNOSIS — R7309 Other abnormal glucose: Secondary | ICD-10-CM | POA: Diagnosis not present

## 2013-12-26 DIAGNOSIS — E785 Hyperlipidemia, unspecified: Secondary | ICD-10-CM | POA: Diagnosis not present

## 2014-01-16 DIAGNOSIS — M5441 Lumbago with sciatica, right side: Secondary | ICD-10-CM | POA: Diagnosis not present

## 2014-01-16 DIAGNOSIS — M9903 Segmental and somatic dysfunction of lumbar region: Secondary | ICD-10-CM | POA: Diagnosis not present

## 2014-01-16 DIAGNOSIS — S336XXA Sprain of sacroiliac joint, initial encounter: Secondary | ICD-10-CM | POA: Diagnosis not present

## 2014-01-16 DIAGNOSIS — M5136 Other intervertebral disc degeneration, lumbar region: Secondary | ICD-10-CM | POA: Diagnosis not present

## 2014-01-25 DIAGNOSIS — E663 Overweight: Secondary | ICD-10-CM | POA: Diagnosis not present

## 2014-01-25 DIAGNOSIS — Z1389 Encounter for screening for other disorder: Secondary | ICD-10-CM | POA: Diagnosis not present

## 2014-01-25 DIAGNOSIS — Z0000 Encounter for general adult medical examination without abnormal findings: Secondary | ICD-10-CM | POA: Diagnosis not present

## 2014-01-25 DIAGNOSIS — E538 Deficiency of other specified B group vitamins: Secondary | ICD-10-CM | POA: Diagnosis not present

## 2014-01-25 DIAGNOSIS — M859 Disorder of bone density and structure, unspecified: Secondary | ICD-10-CM | POA: Diagnosis not present

## 2014-01-25 DIAGNOSIS — E785 Hyperlipidemia, unspecified: Secondary | ICD-10-CM | POA: Diagnosis not present

## 2014-02-04 ENCOUNTER — Other Ambulatory Visit: Payer: Self-pay | Admitting: Internal Medicine

## 2014-02-04 DIAGNOSIS — R740 Nonspecific elevation of levels of transaminase and lactic acid dehydrogenase [LDH]: Principal | ICD-10-CM

## 2014-02-04 DIAGNOSIS — D81818 Other biotin-dependent carboxylase deficiency: Secondary | ICD-10-CM | POA: Diagnosis not present

## 2014-02-04 DIAGNOSIS — K219 Gastro-esophageal reflux disease without esophagitis: Secondary | ICD-10-CM | POA: Diagnosis not present

## 2014-02-04 DIAGNOSIS — J309 Allergic rhinitis, unspecified: Secondary | ICD-10-CM | POA: Diagnosis not present

## 2014-02-04 DIAGNOSIS — E785 Hyperlipidemia, unspecified: Secondary | ICD-10-CM | POA: Diagnosis not present

## 2014-02-04 DIAGNOSIS — J449 Chronic obstructive pulmonary disease, unspecified: Secondary | ICD-10-CM | POA: Diagnosis not present

## 2014-02-04 DIAGNOSIS — R7402 Elevation of levels of lactic acid dehydrogenase (LDH): Secondary | ICD-10-CM

## 2014-02-04 DIAGNOSIS — F17210 Nicotine dependence, cigarettes, uncomplicated: Secondary | ICD-10-CM | POA: Diagnosis not present

## 2014-02-05 ENCOUNTER — Ambulatory Visit
Admission: RE | Admit: 2014-02-05 | Discharge: 2014-02-05 | Disposition: A | Discharge status: Home / Self Care | Payer: Medicare Other | Source: Ambulatory Visit | Attending: Internal Medicine | Admitting: Internal Medicine

## 2014-02-05 DIAGNOSIS — R7989 Other specified abnormal findings of blood chemistry: Secondary | ICD-10-CM | POA: Diagnosis not present

## 2014-02-05 DIAGNOSIS — R740 Nonspecific elevation of levels of transaminase and lactic acid dehydrogenase [LDH]: Principal | ICD-10-CM

## 2014-02-05 DIAGNOSIS — R7402 Elevation of levels of lactic acid dehydrogenase (LDH): Secondary | ICD-10-CM

## 2014-02-13 DIAGNOSIS — M5136 Other intervertebral disc degeneration, lumbar region: Secondary | ICD-10-CM | POA: Diagnosis not present

## 2014-02-13 DIAGNOSIS — S336XXA Sprain of sacroiliac joint, initial encounter: Secondary | ICD-10-CM | POA: Diagnosis not present

## 2014-02-13 DIAGNOSIS — M9903 Segmental and somatic dysfunction of lumbar region: Secondary | ICD-10-CM | POA: Diagnosis not present

## 2014-02-13 DIAGNOSIS — M5441 Lumbago with sciatica, right side: Secondary | ICD-10-CM | POA: Diagnosis not present

## 2014-03-05 DIAGNOSIS — H2513 Age-related nuclear cataract, bilateral: Secondary | ICD-10-CM | POA: Diagnosis not present

## 2014-03-05 DIAGNOSIS — H25013 Cortical age-related cataract, bilateral: Secondary | ICD-10-CM | POA: Diagnosis not present

## 2014-03-05 DIAGNOSIS — H5000 Unspecified esotropia: Secondary | ICD-10-CM | POA: Diagnosis not present

## 2014-03-05 DIAGNOSIS — H53002 Unspecified amblyopia, left eye: Secondary | ICD-10-CM | POA: Diagnosis not present

## 2014-03-13 DIAGNOSIS — S336XXA Sprain of sacroiliac joint, initial encounter: Secondary | ICD-10-CM | POA: Diagnosis not present

## 2014-03-13 DIAGNOSIS — M5136 Other intervertebral disc degeneration, lumbar region: Secondary | ICD-10-CM | POA: Diagnosis not present

## 2014-03-13 DIAGNOSIS — M5441 Lumbago with sciatica, right side: Secondary | ICD-10-CM | POA: Diagnosis not present

## 2014-03-13 DIAGNOSIS — M9903 Segmental and somatic dysfunction of lumbar region: Secondary | ICD-10-CM | POA: Diagnosis not present

## 2014-03-19 DIAGNOSIS — M9903 Segmental and somatic dysfunction of lumbar region: Secondary | ICD-10-CM | POA: Diagnosis not present

## 2014-03-19 DIAGNOSIS — M5441 Lumbago with sciatica, right side: Secondary | ICD-10-CM | POA: Diagnosis not present

## 2014-03-19 DIAGNOSIS — S336XXA Sprain of sacroiliac joint, initial encounter: Secondary | ICD-10-CM | POA: Diagnosis not present

## 2014-03-19 DIAGNOSIS — M5136 Other intervertebral disc degeneration, lumbar region: Secondary | ICD-10-CM | POA: Diagnosis not present

## 2014-03-26 DIAGNOSIS — M9903 Segmental and somatic dysfunction of lumbar region: Secondary | ICD-10-CM | POA: Diagnosis not present

## 2014-03-26 DIAGNOSIS — M5136 Other intervertebral disc degeneration, lumbar region: Secondary | ICD-10-CM | POA: Diagnosis not present

## 2014-03-26 DIAGNOSIS — S336XXA Sprain of sacroiliac joint, initial encounter: Secondary | ICD-10-CM | POA: Diagnosis not present

## 2014-03-26 DIAGNOSIS — M5441 Lumbago with sciatica, right side: Secondary | ICD-10-CM | POA: Diagnosis not present

## 2014-04-10 DIAGNOSIS — M5441 Lumbago with sciatica, right side: Secondary | ICD-10-CM | POA: Diagnosis not present

## 2014-04-10 DIAGNOSIS — M9903 Segmental and somatic dysfunction of lumbar region: Secondary | ICD-10-CM | POA: Diagnosis not present

## 2014-04-10 DIAGNOSIS — S336XXA Sprain of sacroiliac joint, initial encounter: Secondary | ICD-10-CM | POA: Diagnosis not present

## 2014-04-10 DIAGNOSIS — M5136 Other intervertebral disc degeneration, lumbar region: Secondary | ICD-10-CM | POA: Diagnosis not present

## 2014-04-16 DIAGNOSIS — M5136 Other intervertebral disc degeneration, lumbar region: Secondary | ICD-10-CM | POA: Diagnosis not present

## 2014-04-16 DIAGNOSIS — M9903 Segmental and somatic dysfunction of lumbar region: Secondary | ICD-10-CM | POA: Diagnosis not present

## 2014-04-16 DIAGNOSIS — M5441 Lumbago with sciatica, right side: Secondary | ICD-10-CM | POA: Diagnosis not present

## 2014-04-16 DIAGNOSIS — S336XXA Sprain of sacroiliac joint, initial encounter: Secondary | ICD-10-CM | POA: Diagnosis not present

## 2014-04-23 DIAGNOSIS — S336XXA Sprain of sacroiliac joint, initial encounter: Secondary | ICD-10-CM | POA: Diagnosis not present

## 2014-04-23 DIAGNOSIS — M5136 Other intervertebral disc degeneration, lumbar region: Secondary | ICD-10-CM | POA: Diagnosis not present

## 2014-04-23 DIAGNOSIS — M9903 Segmental and somatic dysfunction of lumbar region: Secondary | ICD-10-CM | POA: Diagnosis not present

## 2014-04-23 DIAGNOSIS — M5441 Lumbago with sciatica, right side: Secondary | ICD-10-CM | POA: Diagnosis not present

## 2014-05-08 DIAGNOSIS — M5441 Lumbago with sciatica, right side: Secondary | ICD-10-CM | POA: Diagnosis not present

## 2014-05-08 DIAGNOSIS — S336XXA Sprain of sacroiliac joint, initial encounter: Secondary | ICD-10-CM | POA: Diagnosis not present

## 2014-05-08 DIAGNOSIS — M9903 Segmental and somatic dysfunction of lumbar region: Secondary | ICD-10-CM | POA: Diagnosis not present

## 2014-05-08 DIAGNOSIS — M5136 Other intervertebral disc degeneration, lumbar region: Secondary | ICD-10-CM | POA: Diagnosis not present

## 2014-06-06 DIAGNOSIS — M9903 Segmental and somatic dysfunction of lumbar region: Secondary | ICD-10-CM | POA: Diagnosis not present

## 2014-06-06 DIAGNOSIS — S336XXA Sprain of sacroiliac joint, initial encounter: Secondary | ICD-10-CM | POA: Diagnosis not present

## 2014-06-06 DIAGNOSIS — M5441 Lumbago with sciatica, right side: Secondary | ICD-10-CM | POA: Diagnosis not present

## 2014-06-06 DIAGNOSIS — M5136 Other intervertebral disc degeneration, lumbar region: Secondary | ICD-10-CM | POA: Diagnosis not present

## 2014-06-24 DIAGNOSIS — I10 Essential (primary) hypertension: Secondary | ICD-10-CM | POA: Diagnosis not present

## 2014-06-24 DIAGNOSIS — E559 Vitamin D deficiency, unspecified: Secondary | ICD-10-CM | POA: Diagnosis not present

## 2014-06-24 DIAGNOSIS — R7301 Impaired fasting glucose: Secondary | ICD-10-CM | POA: Diagnosis not present

## 2014-06-26 DIAGNOSIS — M8589 Other specified disorders of bone density and structure, multiple sites: Secondary | ICD-10-CM | POA: Diagnosis not present

## 2014-06-26 DIAGNOSIS — E782 Mixed hyperlipidemia: Secondary | ICD-10-CM | POA: Diagnosis not present

## 2014-06-26 DIAGNOSIS — R7309 Other abnormal glucose: Secondary | ICD-10-CM | POA: Diagnosis not present

## 2014-06-26 DIAGNOSIS — R7301 Impaired fasting glucose: Secondary | ICD-10-CM | POA: Diagnosis not present

## 2014-06-26 DIAGNOSIS — F17210 Nicotine dependence, cigarettes, uncomplicated: Secondary | ICD-10-CM | POA: Diagnosis not present

## 2014-06-26 DIAGNOSIS — E2609 Other primary hyperaldosteronism: Secondary | ICD-10-CM | POA: Diagnosis not present

## 2014-06-26 DIAGNOSIS — R7302 Impaired glucose tolerance (oral): Secondary | ICD-10-CM | POA: Diagnosis not present

## 2014-06-26 DIAGNOSIS — I10 Essential (primary) hypertension: Secondary | ICD-10-CM | POA: Diagnosis not present

## 2014-07-02 DIAGNOSIS — R05 Cough: Secondary | ICD-10-CM | POA: Diagnosis not present

## 2014-07-02 DIAGNOSIS — F17210 Nicotine dependence, cigarettes, uncomplicated: Secondary | ICD-10-CM | POA: Diagnosis not present

## 2014-07-02 DIAGNOSIS — J309 Allergic rhinitis, unspecified: Secondary | ICD-10-CM | POA: Diagnosis not present

## 2014-07-03 DIAGNOSIS — M9903 Segmental and somatic dysfunction of lumbar region: Secondary | ICD-10-CM | POA: Diagnosis not present

## 2014-07-03 DIAGNOSIS — S336XXA Sprain of sacroiliac joint, initial encounter: Secondary | ICD-10-CM | POA: Diagnosis not present

## 2014-07-03 DIAGNOSIS — M5441 Lumbago with sciatica, right side: Secondary | ICD-10-CM | POA: Diagnosis not present

## 2014-07-03 DIAGNOSIS — M5136 Other intervertebral disc degeneration, lumbar region: Secondary | ICD-10-CM | POA: Diagnosis not present

## 2014-07-29 DIAGNOSIS — E785 Hyperlipidemia, unspecified: Secondary | ICD-10-CM | POA: Diagnosis not present

## 2014-07-29 DIAGNOSIS — M8580 Other specified disorders of bone density and structure, unspecified site: Secondary | ICD-10-CM | POA: Diagnosis not present

## 2014-07-29 DIAGNOSIS — E538 Deficiency of other specified B group vitamins: Secondary | ICD-10-CM | POA: Diagnosis not present

## 2014-07-29 DIAGNOSIS — E559 Vitamin D deficiency, unspecified: Secondary | ICD-10-CM | POA: Diagnosis not present

## 2014-07-31 DIAGNOSIS — M9903 Segmental and somatic dysfunction of lumbar region: Secondary | ICD-10-CM | POA: Diagnosis not present

## 2014-07-31 DIAGNOSIS — M9904 Segmental and somatic dysfunction of sacral region: Secondary | ICD-10-CM | POA: Diagnosis not present

## 2014-07-31 DIAGNOSIS — M9905 Segmental and somatic dysfunction of pelvic region: Secondary | ICD-10-CM | POA: Diagnosis not present

## 2014-07-31 DIAGNOSIS — M5136 Other intervertebral disc degeneration, lumbar region: Secondary | ICD-10-CM | POA: Diagnosis not present

## 2014-08-01 DIAGNOSIS — M5136 Other intervertebral disc degeneration, lumbar region: Secondary | ICD-10-CM | POA: Diagnosis not present

## 2014-08-01 DIAGNOSIS — M9903 Segmental and somatic dysfunction of lumbar region: Secondary | ICD-10-CM | POA: Diagnosis not present

## 2014-08-01 DIAGNOSIS — M9905 Segmental and somatic dysfunction of pelvic region: Secondary | ICD-10-CM | POA: Diagnosis not present

## 2014-08-01 DIAGNOSIS — M9904 Segmental and somatic dysfunction of sacral region: Secondary | ICD-10-CM | POA: Diagnosis not present

## 2014-08-09 DIAGNOSIS — M5136 Other intervertebral disc degeneration, lumbar region: Secondary | ICD-10-CM | POA: Diagnosis not present

## 2014-08-09 DIAGNOSIS — M9904 Segmental and somatic dysfunction of sacral region: Secondary | ICD-10-CM | POA: Diagnosis not present

## 2014-08-09 DIAGNOSIS — M9905 Segmental and somatic dysfunction of pelvic region: Secondary | ICD-10-CM | POA: Diagnosis not present

## 2014-08-09 DIAGNOSIS — M9903 Segmental and somatic dysfunction of lumbar region: Secondary | ICD-10-CM | POA: Diagnosis not present

## 2014-08-14 DIAGNOSIS — J309 Allergic rhinitis, unspecified: Secondary | ICD-10-CM | POA: Diagnosis not present

## 2014-08-14 DIAGNOSIS — E538 Deficiency of other specified B group vitamins: Secondary | ICD-10-CM | POA: Diagnosis not present

## 2014-08-14 DIAGNOSIS — F17210 Nicotine dependence, cigarettes, uncomplicated: Secondary | ICD-10-CM | POA: Diagnosis not present

## 2014-08-14 DIAGNOSIS — E785 Hyperlipidemia, unspecified: Secondary | ICD-10-CM | POA: Diagnosis not present

## 2014-08-14 DIAGNOSIS — M8580 Other specified disorders of bone density and structure, unspecified site: Secondary | ICD-10-CM | POA: Diagnosis not present

## 2014-08-28 DIAGNOSIS — M5136 Other intervertebral disc degeneration, lumbar region: Secondary | ICD-10-CM | POA: Diagnosis not present

## 2014-08-28 DIAGNOSIS — M9905 Segmental and somatic dysfunction of pelvic region: Secondary | ICD-10-CM | POA: Diagnosis not present

## 2014-08-28 DIAGNOSIS — M9904 Segmental and somatic dysfunction of sacral region: Secondary | ICD-10-CM | POA: Diagnosis not present

## 2014-08-28 DIAGNOSIS — M9903 Segmental and somatic dysfunction of lumbar region: Secondary | ICD-10-CM | POA: Diagnosis not present

## 2014-09-05 DIAGNOSIS — M9905 Segmental and somatic dysfunction of pelvic region: Secondary | ICD-10-CM | POA: Diagnosis not present

## 2014-09-05 DIAGNOSIS — M9903 Segmental and somatic dysfunction of lumbar region: Secondary | ICD-10-CM | POA: Diagnosis not present

## 2014-09-05 DIAGNOSIS — M9904 Segmental and somatic dysfunction of sacral region: Secondary | ICD-10-CM | POA: Diagnosis not present

## 2014-09-05 DIAGNOSIS — M5136 Other intervertebral disc degeneration, lumbar region: Secondary | ICD-10-CM | POA: Diagnosis not present

## 2014-09-18 DIAGNOSIS — M9905 Segmental and somatic dysfunction of pelvic region: Secondary | ICD-10-CM | POA: Diagnosis not present

## 2014-09-18 DIAGNOSIS — M5136 Other intervertebral disc degeneration, lumbar region: Secondary | ICD-10-CM | POA: Diagnosis not present

## 2014-09-18 DIAGNOSIS — M9903 Segmental and somatic dysfunction of lumbar region: Secondary | ICD-10-CM | POA: Diagnosis not present

## 2014-09-18 DIAGNOSIS — M9904 Segmental and somatic dysfunction of sacral region: Secondary | ICD-10-CM | POA: Diagnosis not present

## 2014-09-19 DIAGNOSIS — M9903 Segmental and somatic dysfunction of lumbar region: Secondary | ICD-10-CM | POA: Diagnosis not present

## 2014-09-19 DIAGNOSIS — M9905 Segmental and somatic dysfunction of pelvic region: Secondary | ICD-10-CM | POA: Diagnosis not present

## 2014-09-19 DIAGNOSIS — M9904 Segmental and somatic dysfunction of sacral region: Secondary | ICD-10-CM | POA: Diagnosis not present

## 2014-09-19 DIAGNOSIS — M5136 Other intervertebral disc degeneration, lumbar region: Secondary | ICD-10-CM | POA: Diagnosis not present

## 2014-09-25 DIAGNOSIS — M9903 Segmental and somatic dysfunction of lumbar region: Secondary | ICD-10-CM | POA: Diagnosis not present

## 2014-09-25 DIAGNOSIS — M9905 Segmental and somatic dysfunction of pelvic region: Secondary | ICD-10-CM | POA: Diagnosis not present

## 2014-09-25 DIAGNOSIS — M5136 Other intervertebral disc degeneration, lumbar region: Secondary | ICD-10-CM | POA: Diagnosis not present

## 2014-09-25 DIAGNOSIS — M9904 Segmental and somatic dysfunction of sacral region: Secondary | ICD-10-CM | POA: Diagnosis not present

## 2014-10-08 DIAGNOSIS — M5032 Other cervical disc degeneration, mid-cervical region: Secondary | ICD-10-CM | POA: Diagnosis not present

## 2014-10-08 DIAGNOSIS — M9901 Segmental and somatic dysfunction of cervical region: Secondary | ICD-10-CM | POA: Diagnosis not present

## 2014-10-08 DIAGNOSIS — M9902 Segmental and somatic dysfunction of thoracic region: Secondary | ICD-10-CM | POA: Diagnosis not present

## 2014-10-08 DIAGNOSIS — M25511 Pain in right shoulder: Secondary | ICD-10-CM | POA: Diagnosis not present

## 2014-10-10 DIAGNOSIS — M5032 Other cervical disc degeneration, mid-cervical region: Secondary | ICD-10-CM | POA: Diagnosis not present

## 2014-10-10 DIAGNOSIS — M9902 Segmental and somatic dysfunction of thoracic region: Secondary | ICD-10-CM | POA: Diagnosis not present

## 2014-10-10 DIAGNOSIS — M9901 Segmental and somatic dysfunction of cervical region: Secondary | ICD-10-CM | POA: Diagnosis not present

## 2014-10-10 DIAGNOSIS — M25511 Pain in right shoulder: Secondary | ICD-10-CM | POA: Diagnosis not present

## 2014-10-11 ENCOUNTER — Ambulatory Visit
Admission: RE | Admit: 2014-10-11 | Discharge: 2014-10-11 | Disposition: A | Discharge status: Home / Self Care | Payer: Medicare Other | Source: Ambulatory Visit | Attending: Internal Medicine | Admitting: Internal Medicine

## 2014-10-11 ENCOUNTER — Other Ambulatory Visit: Payer: Self-pay | Admitting: Internal Medicine

## 2014-10-11 DIAGNOSIS — R911 Solitary pulmonary nodule: Secondary | ICD-10-CM | POA: Diagnosis not present

## 2014-10-11 DIAGNOSIS — R269 Unspecified abnormalities of gait and mobility: Secondary | ICD-10-CM

## 2014-10-11 DIAGNOSIS — J449 Chronic obstructive pulmonary disease, unspecified: Secondary | ICD-10-CM | POA: Diagnosis not present

## 2014-10-11 DIAGNOSIS — R51 Headache: Secondary | ICD-10-CM | POA: Diagnosis not present

## 2014-10-11 DIAGNOSIS — S0990XA Unspecified injury of head, initial encounter: Secondary | ICD-10-CM | POA: Diagnosis not present

## 2014-10-11 DIAGNOSIS — J929 Pleural plaque without asbestos: Secondary | ICD-10-CM | POA: Diagnosis not present

## 2014-10-11 DIAGNOSIS — F17210 Nicotine dependence, cigarettes, uncomplicated: Secondary | ICD-10-CM | POA: Diagnosis not present

## 2014-10-14 ENCOUNTER — Other Ambulatory Visit: Payer: Self-pay | Admitting: Internal Medicine

## 2014-10-14 DIAGNOSIS — R269 Unspecified abnormalities of gait and mobility: Secondary | ICD-10-CM

## 2014-10-23 DIAGNOSIS — M9902 Segmental and somatic dysfunction of thoracic region: Secondary | ICD-10-CM | POA: Diagnosis not present

## 2014-10-23 DIAGNOSIS — M5032 Other cervical disc degeneration, mid-cervical region: Secondary | ICD-10-CM | POA: Diagnosis not present

## 2014-10-23 DIAGNOSIS — M9901 Segmental and somatic dysfunction of cervical region: Secondary | ICD-10-CM | POA: Diagnosis not present

## 2014-10-23 DIAGNOSIS — M25511 Pain in right shoulder: Secondary | ICD-10-CM | POA: Diagnosis not present

## 2014-10-28 ENCOUNTER — Ambulatory Visit (INDEPENDENT_AMBULATORY_CARE_PROVIDER_SITE_OTHER): Payer: Medicare Other | Admitting: Licensed Clinical Social Worker

## 2014-10-28 DIAGNOSIS — F332 Major depressive disorder, recurrent severe without psychotic features: Secondary | ICD-10-CM

## 2014-11-05 ENCOUNTER — Ambulatory Visit (INDEPENDENT_AMBULATORY_CARE_PROVIDER_SITE_OTHER): Payer: Medicare Other | Admitting: Licensed Clinical Social Worker

## 2014-11-05 DIAGNOSIS — F332 Major depressive disorder, recurrent severe without psychotic features: Secondary | ICD-10-CM | POA: Diagnosis not present

## 2014-11-06 ENCOUNTER — Ambulatory Visit (INDEPENDENT_AMBULATORY_CARE_PROVIDER_SITE_OTHER): Payer: Medicare Other | Admitting: Neurology

## 2014-11-06 ENCOUNTER — Encounter: Payer: Self-pay | Admitting: Neurology

## 2014-11-06 VITALS — BP 124/78 | HR 64 | Ht 63.0 in | Wt 142.6 lb

## 2014-11-06 DIAGNOSIS — R269 Unspecified abnormalities of gait and mobility: Secondary | ICD-10-CM

## 2014-11-06 NOTE — Patient Instructions (Addendum)
We will get blood work today and check MRI evaluation of the spine. I will follow up to re-check the walking in about 5 months, but you need to call if the walking is getting worse.  Fall Prevention and Home Safety Falls cause injuries and can affect all age groups. It is possible to use preventive measures to significantly decrease the likelihood of falls. There are many simple measures which can make your home safer and prevent falls. OUTDOORS  Repair cracks and edges of walkways and driveways.  Remove high doorway thresholds.  Trim shrubbery on the main path into your home.  Have good outside lighting.  Clear walkways of tools, rocks, debris, and clutter.  Check that handrails are not broken and are securely fastened. Both sides of steps should have handrails.  Have leaves, snow, and ice cleared regularly.  Use sand or salt on walkways during winter months.  In the garage, clean up grease or oil spills. BATHROOM  Install night lights.  Install grab bars by the toilet and in the tub and shower.  Use non-skid mats or decals in the tub or shower.  Place a plastic non-slip stool in the shower to sit on, if needed.  Keep floors dry and clean up all water on the floor immediately.  Remove soap buildup in the tub or shower on a regular basis.  Secure bath mats with non-slip, double-sided rug tape.  Remove throw rugs and tripping hazards from the floors. BEDROOMS  Install night lights.  Make sure a bedside light is easy to reach.  Do not use oversized bedding.  Keep a telephone by your bedside.  Have a firm chair with side arms to use for getting dressed.  Remove throw rugs and tripping hazards from the floor. KITCHEN  Keep handles on pots and pans turned toward the center of the stove. Use back burners when possible.  Clean up spills quickly and allow time for drying.  Avoid walking on wet floors.  Avoid hot utensils and knives.  Position shelves so they  are not too high or low.  Place commonly used objects within easy reach.  If necessary, use a sturdy step stool with a grab bar when reaching.  Keep electrical cables out of the way.  Do not use floor polish or wax that makes floors slippery. If you must use wax, use non-skid floor wax.  Remove throw rugs and tripping hazards from the floor. STAIRWAYS  Never leave objects on stairs.  Place handrails on both sides of stairways and use them. Fix any loose handrails. Make sure handrails on both sides of the stairways are as long as the stairs.  Check carpeting to make sure it is firmly attached along stairs. Make repairs to worn or loose carpet promptly.  Avoid placing throw rugs at the top or bottom of stairways, or properly secure the rug with carpet tape to prevent slippage. Get rid of throw rugs, if possible.  Have an electrician put in a light switch at the top and bottom of the stairs. OTHER FALL PREVENTION TIPS  Wear low-heel or rubber-soled shoes that are supportive and fit well. Wear closed toe shoes.  When using a stepladder, make sure it is fully opened and both spreaders are firmly locked. Do not climb a closed stepladder.  Add color or contrast paint or tape to grab bars and handrails in your home. Place contrasting color strips on first and last steps.  Learn and use mobility aids as needed. Install  an electrical emergency response system.  Turn on lights to avoid dark areas. Replace light bulbs that burn out immediately. Get light switches that glow.  Arrange furniture to create clear pathways. Keep furniture in the same place.  Firmly attach carpet with non-skid or double-sided tape.  Eliminate uneven floor surfaces.  Select a carpet pattern that does not visually hide the edge of steps.  Be aware of all pets. OTHER HOME SAFETY TIPS  Set the water temperature for 120 F (48.8 C).  Keep emergency numbers on or near the telephone.  Keep smoke detectors on  every level of the home and near sleeping areas. Document Released: 03/05/2002 Document Revised: 09/14/2011 Document Reviewed: 06/04/2011 [facility] Patient Information 2015 [place], [place]. This information is not intended to replace advice given to you by your health care provider. Make sure you discuss any questions you have with your health care provider.

## 2014-11-06 NOTE — Progress Notes (Addendum)
Reason for visit: Gait disorder   Referring physician: Dr. Candace Gallus is a 69 y.o. female  History of present illness:  Jenny Oliver is a 69 year old right-handed white female with a history of a gait disorder that she believes has been present for about one year. The patient indicates that she will have intermittent problems, her walking is not always affected. She seems to misjudge objects in front of her at times, she will not lift her foot up high enough to step over an object, or she will miss a step. The patient reports some occasional dizziness, but this does not correlate with the walking issue. At times, she feels as if she is shuffling her feet, no tremor has been noted. She denies numbness or weakness of the extremities with exception that she does have some intermittent numbness of the hands. The patient denies any neck pain, but she does have some low back and without radiation down the legs. She reports no issues controlling the bowels or the bladder. She denies any significant memory issues. She does have frequent headaches, she indicates that her headaches have been present for 2 or 3 years. The last fall occurred about one month ago. The patient has undergone a CT scan of the brain which appears to be unremarkable, no significant cerebrovascular disease is noted. She has undergone blood work that included a vitamin B12 level, she was told that this level was actually high. She is sent for further management and evaluation.  Past Medical History  Diagnosis Date  . GERD (gastroesophageal reflux disease)   . Chronic kidney disease   . Liddle's syndrome   . COPD (chronic obstructive pulmonary disease)   . Vitamin D deficiency   . Vitamin B12 deficiency   . OA (osteoarthritis)   . Sciatica   . Gait disorder 11/06/2014    Past Surgical History  Procedure Laterality Date  . Balloon dilation  11/03/2011    Procedure: BALLOON DILATION;  Surgeon: Arta Silence, MD;   Location: [facility];  Service: Endoscopy;  Laterality: N/A;  . Partial hysterectomy  1994  . Tonsillectomy      Family History  Problem Relation Age of Onset  . Heart disease Mother   . Hyperlipidemia Mother   . Cancer Father     stomach  . Healthy Brother     Social history:  reports that she has been smoking.  She has never used smokeless tobacco. She reports that she does not drink alcohol or use illicit drugs.  Medications:  Prior to Admission medications   Medication Sig Start Date End Date Taking? Authorizing Provider  acetaminophen (TYLENOL) 650 MG CR tablet Take 650 mg by mouth daily.   Yes Historical Provider, MD  albuterol (PROVENTIL HFA;VENTOLIN HFA) 108 (90 BASE) MCG/ACT inhaler Inhale 2 puffs into the lungs every 6 (six) hours as needed for wheezing or shortness of breath. 09/06/13  Yes Barton Fanny, MD  aMILoride (MIDAMOR) 5 MG tablet Take 5 mg by mouth daily.   Yes Historical Provider, MD  Calcium Carb-Cholecalciferol (CALCIUM 1000 + D PO) Take 1 capsule by mouth 2 (two) times daily.   Yes Historical Provider, MD  Coenzyme Q10 (CO Q 10) 100 MG CAPS Take 2 capsules by mouth daily.   Yes Historical Provider, MD  Collagen 500 MG CAPS Take 4 capsules by mouth daily.   Yes Historical Provider, MD  fluocinonide-emollient (LIDEX-E) 0.05 % cream Apply top to affected area up to twice  per day as needed for itching. 05/31/11  Yes Wendie Agreste, MD  Glucosamine HCl-MSM 750-750 MG TABS Take 1 tablet by mouth 2 (two) times daily.   Yes Historical Provider, MD  Magnesium 500 MG TABS Take 1 tablet by mouth daily.   Yes Historical Provider, MD  Potassium 99 MG TABS Take 1 tablet by mouth daily.   Yes Historical Provider, MD  pravastatin (PRAVACHOL) 80 MG tablet Take 80 mg by mouth daily.   Yes Historical Provider, MD  Probiotic Product (PROBIOTIC DAILY PO) Take by mouth 2 (two) times daily.   Yes Historical Provider, MD      Allergies  Allergen Reactions  . Crestor  [Rosuvastatin]   . Lipitor [Atorvastatin]   . Milk-Related Compounds   . Other     Bread, peanuts/tree nuts  . Zetia [Ezetimibe]     ROS:  Out of a complete 14 system review of symptoms, the patient complains only of the following symptoms, and all other reviewed systems are negative.  Gait disorder  Blood pressure 124/78, pulse 64, height 5\' 3"  (1.6 m), weight 142 lb 9.6 oz (64.683 kg).   Blood pressure, right arm, standing is 138/78. Blood pressure, right arm, sitting is 136/80.  Physical Exam  General: The patient is alert and cooperative at the time of the examination.  Eyes: Pupils are equal, round, and reactive to light. Discs are flat bilaterally.  Neck: The neck is supple, no carotid bruits are noted.  Respiratory: The respiratory examination is clear.  Cardiovascular: The cardiovascular examination reveals a regular rate and rhythm, no obvious murmurs or rubs are noted.  Skin: Extremities are without significant edema.  Neurologic Exam  Mental status: The patient is alert and oriented x 3 at the time of the examination. The patient has apparent normal recent and remote memory, with an apparently normal attention span and concentration ability.  Cranial nerves: Facial symmetry is present. There is good sensation of the face to pinprick and soft touch bilaterally. The strength of the facial muscles and the muscles to head turning and shoulder shrug are normal bilaterally. Speech is well enunciated, no aphasia or dysarthria is noted. Extraocular movements are full. Visual fields are full. The tongue is midline, and the patient has symmetric elevation of the soft palate. No obvious hearing deficits are noted.  Motor: The motor testing reveals 5 over 5 strength of all 4 extremities. Good symmetric motor tone is noted throughout.  Sensory: Sensory testing is intact to pinprick, soft touch, vibration sensation, and position sense on all 4 extremities, with exception of some  decrease in pinprick sensation in the right hand, and the right lower extremity. No evidence of extinction is noted.  Coordination: Cerebellar testing reveals good finger-nose-finger and heel-to-shin bilaterally.  Gait and station: Gait is normal. Tandem gait is normal. Romberg is negative. No drift is seen.  Reflexes: Deep tendon reflexes are symmetric, but are somewhat brisk at the biceps, brachioradialis, and knees bilaterally. Toes are downgoing bilaterally.   Assessment/Plan:  1. Reported gait disorder  The clinical examination today was relatively unremarkable with exception that she appears to have some elevation of reflexes in the arms and legs that is relatively symmetric. This may be a normal finding for her. The ability to ambulate today appears to be completely normal. The patient indicates that her walking problems are associated with misjudging objects in front of her. The CT scan of the brain appears to be relatively unremarkable. Given the hyperreflexia, I will check  MRI evaluation of the cervical spine, and we will check blood work today. I will follow her ambulation abilities over time, we will recheck an examination about 5 months. The patient will contact me if she believes that her walking is changing significantly.  Jill Alexanders MD 11/06/2014 7:25 PM  [facility] [address] [place], [place] [address]  Phone [phone] Fax [phone]

## 2014-11-08 ENCOUNTER — Telehealth: Payer: Self-pay

## 2014-11-08 LAB — SEDIMENTATION RATE: SED RATE: 2 mm/h (ref 0–40)

## 2014-11-08 LAB — COPPER, SERUM: Copper: 118 ug/dL (ref 72–166)

## 2014-11-08 LAB — HTLV I+II ANTIBODIES, (EIA), BLD: HTLV I/II Ab: NEGATIVE

## 2014-11-08 LAB — RPR: RPR: NONREACTIVE

## 2014-11-08 NOTE — Telephone Encounter (Signed)
Left voicemail relaying results. 

## 2014-11-08 NOTE — Telephone Encounter (Signed)
-----   Message from Kathrynn Ducking, MD sent at 11/08/2014  7:55 AM EDT -----  The blood work results are unremarkable. Please call the patient.  ----- Message -----    From: [facility] Lab Results In Interface    Sent: 11/07/2014   7:42 AM      To: Kathrynn Ducking, MD

## 2014-11-11 ENCOUNTER — Other Ambulatory Visit: Payer: Self-pay

## 2014-11-12 ENCOUNTER — Ambulatory Visit (INDEPENDENT_AMBULATORY_CARE_PROVIDER_SITE_OTHER): Payer: Medicare Other | Admitting: Licensed Clinical Social Worker

## 2014-11-12 DIAGNOSIS — F3181 Bipolar II disorder: Secondary | ICD-10-CM | POA: Diagnosis not present

## 2014-11-15 ENCOUNTER — Ambulatory Visit
Admission: RE | Admit: 2014-11-15 | Discharge: 2014-11-15 | Disposition: A | Discharge status: Home / Self Care | Payer: Medicare Other | Source: Ambulatory Visit | Attending: Neurology | Admitting: Neurology

## 2014-11-15 DIAGNOSIS — R269 Unspecified abnormalities of gait and mobility: Secondary | ICD-10-CM

## 2014-11-18 ENCOUNTER — Telehealth: Payer: Self-pay | Admitting: Neurology

## 2014-11-18 DIAGNOSIS — R269 Unspecified abnormalities of gait and mobility: Secondary | ICD-10-CM

## 2014-11-18 NOTE — Telephone Encounter (Signed)
I called the patient. The patient had MRI evaluation of the cervical spine, no significant issues were noted, no spinal cord compression. The patient indicates that she has some mild balance issues, but she also has a lot of knee and hip discomfort. Clinical examination did not show a lot of weakness of the extremities, and the balance appear to be relatively good at the time of examination. I will send the patient for physical therapy for balance training, we will follow-up in the office in the future. She is having some pain up and down entire spine, we may need to investigate the low back with MRI and possibly EMG and nerve conduction studies in the future. The patient questions whether pravastatin has call some of her problems.

## 2014-11-19 ENCOUNTER — Ambulatory Visit (INDEPENDENT_AMBULATORY_CARE_PROVIDER_SITE_OTHER): Payer: Medicare Other | Admitting: Licensed Clinical Social Worker

## 2014-11-19 DIAGNOSIS — F332 Major depressive disorder, recurrent severe without psychotic features: Secondary | ICD-10-CM | POA: Diagnosis not present

## 2014-11-20 DIAGNOSIS — M9901 Segmental and somatic dysfunction of cervical region: Secondary | ICD-10-CM | POA: Diagnosis not present

## 2014-11-20 DIAGNOSIS — M5032 Other cervical disc degeneration, mid-cervical region: Secondary | ICD-10-CM | POA: Diagnosis not present

## 2014-11-20 DIAGNOSIS — M25511 Pain in right shoulder: Secondary | ICD-10-CM | POA: Diagnosis not present

## 2014-11-20 DIAGNOSIS — M9902 Segmental and somatic dysfunction of thoracic region: Secondary | ICD-10-CM | POA: Diagnosis not present

## 2014-11-22 DIAGNOSIS — M9902 Segmental and somatic dysfunction of thoracic region: Secondary | ICD-10-CM | POA: Diagnosis not present

## 2014-11-22 DIAGNOSIS — M9901 Segmental and somatic dysfunction of cervical region: Secondary | ICD-10-CM | POA: Diagnosis not present

## 2014-11-22 DIAGNOSIS — M5032 Other cervical disc degeneration, mid-cervical region: Secondary | ICD-10-CM | POA: Diagnosis not present

## 2014-11-22 DIAGNOSIS — M25511 Pain in right shoulder: Secondary | ICD-10-CM | POA: Diagnosis not present

## 2014-11-26 ENCOUNTER — Ambulatory Visit (INDEPENDENT_AMBULATORY_CARE_PROVIDER_SITE_OTHER): Payer: Medicare Other | Admitting: Licensed Clinical Social Worker

## 2014-11-26 DIAGNOSIS — F332 Major depressive disorder, recurrent severe without psychotic features: Secondary | ICD-10-CM

## 2014-12-03 ENCOUNTER — Ambulatory Visit (INDEPENDENT_AMBULATORY_CARE_PROVIDER_SITE_OTHER): Payer: Medicare Other | Admitting: Licensed Clinical Social Worker

## 2014-12-03 DIAGNOSIS — F332 Major depressive disorder, recurrent severe without psychotic features: Secondary | ICD-10-CM

## 2014-12-05 ENCOUNTER — Encounter: Payer: Self-pay | Admitting: Rehabilitation

## 2014-12-05 ENCOUNTER — Ambulatory Visit: Payer: Medicare Other | Attending: Neurology | Admitting: Rehabilitation

## 2014-12-05 DIAGNOSIS — R269 Unspecified abnormalities of gait and mobility: Secondary | ICD-10-CM | POA: Diagnosis not present

## 2014-12-05 DIAGNOSIS — R2681 Unsteadiness on feet: Secondary | ICD-10-CM | POA: Diagnosis not present

## 2014-12-05 NOTE — Therapy (Signed)
[facility] [address] [place], [place], [address] Phone: [phone]   Fax:  [phone]  Physical Therapy Evaluation  Patient Details  Name: Jenny Oliver MRN: [id number] Date of Birth: Jan 06, 1946 Referring Provider:  Kathrynn Ducking, MD  Encounter Date: 12/05/2014      PT End of Session - 12/05/14 1100    Visit Number 1   Number of Visits 9   Date for PT Re-Evaluation 02/03/15   Authorization Type G-code due every 10th visit   PT Start Time 0940   PT Stop Time 1025   PT Time Calculation (min) 45 min   Activity Tolerance Patient tolerated treatment well   Behavior During Therapy [facility] for tasks assessed/performed      Past Medical History  Diagnosis Date  . GERD (gastroesophageal reflux disease)   . Chronic kidney disease   . Liddle's syndrome   . COPD (chronic obstructive pulmonary disease)   . Vitamin D deficiency   . Vitamin B12 deficiency   . OA (osteoarthritis)   . Sciatica   . Gait disorder 11/06/2014    Past Surgical History  Procedure Laterality Date  . Balloon dilation  11/03/2011    Procedure: BALLOON DILATION;  Surgeon: Arta Silence, MD;  Location: [facility];  Service: Endoscopy;  Laterality: N/A;  . Partial hysterectomy  1994  . Tonsillectomy      There were no vitals filed for this visit.  Visit Diagnosis:  Gait disorder - Plan: PT plan of care cert/re-cert  Abnormality of gait - Plan: PT plan of care cert/re-cert  Unsteadiness - Plan: PT plan of care cert/re-cert      Subjective Assessment - 12/05/14 0943    Subjective Pt presents with concerns of balance and has had several falls.  Reports that she tends to shuffle feet and can hear shoes skid on the floor and also note that she has fallen outside approx 4-6 weeks ago.  Also has complaints of knee pain and back pain intermittently due to hx of arthritis and sciatica.    Limitations Walking;House hold activities   Patient  Stated Goals "I would like to work on my balance" "to get stronger and correct muscle weakness"    Currently in Pain? No/denies  states she knee and back pain, but not at the moment            [facility] PT Assessment - 12/05/14 0001    Assessment   Medical Diagnosis Falls, balance disorder   Onset Date/Surgical Date 12/04/13   Precautions   Precautions Fall   Required Braces or Orthoses Other Brace/Splint  pt wears back brace for comfort   Balance Screen   Has the patient fallen in the past 6 months Yes   How many times? 1   Has the patient had a decrease in activity level because of a fear of falling?  No   Is the patient reluctant to leave their home because of a fear of falling?  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   Cognition   Overall Cognitive Status --  Reports memory has decreased as of late   Sensation   Light Touch Appears Intact   Proprioception Appears Intact   Coordination   Gross Motor Movements are Fluid and Coordinated Yes   Fine Motor Movements are Fluid and Coordinated Yes   Heel Shin Test WFL   ROM / Strength   AROM / PROM / Strength Strength   Strength   Overall Strength Within functional limits for tasks performed   Overall Strength Comments L hip flex 4-/5, R quad 4/5   Transfers   Transfers Sit to Stand;Stand to Sit   Sit to Stand 7: Independent   Stand to Sit 7:  Independent   Ambulation/Gait   Ambulation/Gait Yes   Ambulation/Gait Assistance 6: Modified independent (Device/Increase time)   Ambulation Distance (Feet) 300 Feet   Assistive device None   Gait Pattern Decreased stride length;Decreased step length - left;Poor foot clearance - left   Ambulation Surface Level;Indoor   Stairs Yes   Stairs Assistance 6: Modified independent (Device/Increase time)   Stair Management Technique Two rails;Alternating pattern;Forwards   Number of Stairs 4   Height of Stairs 6   Balance   Balance Assessed Yes   Standardized Balance Assessment   Standardized Balance Assessment Berg Balance Test   Berg Balance Test   Sit to Stand Able to stand without using hands and stabilize independently   Standing Unsupported Able to stand safely 2 minutes   Sitting with Back Unsupported but Feet Supported on Floor or Stool Able to sit safely and securely 2 minutes   Stand to Sit Sits safely with minimal use of hands   Transfers Able to transfer safely, minor use of hands   Standing Unsupported with Eyes Closed Able to stand 10 seconds safely   Standing Ubsupported with Feet Together Able to place feet together independently and stand 1 minute safely   From Standing, Reach Forward with Outstretched Arm Can reach confidently >25 cm (10")   From Standing Position, Pick up Object from Floor Able to pick up shoe safely and easily   From Standing Position, Turn to Look Behind Over each Shoulder Looks behind from both sides and weight shifts well   Turn 360 Degrees Able to turn 360 degrees safely one side only in 4 seconds or less   Standing Unsupported, Alternately Place Feet on Step/Stool Able to stand independently and safely and complete 8 steps in 20 seconds   Standing Unsupported, One Foot in Front Able to plae foot ahead of the other independently and hold 30 seconds   Standing on One Leg Able to lift leg independently and hold 5-10 seconds   Total Score 53   Functional  Gait  Assessment   Gait assessed  Yes   Gait Level Surface Walks 20 ft in less than 7 sec but greater than 5.5 sec, uses assistive device, slower speed, mild gait deviations, or deviates 6-10 in outside of the 12 in walkway width.   Change in Gait Speed Makes only minor adjustments to walking speed, or accomplishes a change in speed with significant gait deviations, deviates 10-15 in outside the 12 in walkway width, or changes speed but loses balance but is able to recover and continue walking.   Gait  with Horizontal Head Turns Performs head turns smoothly with slight change in gait velocity (eg, minor disruption to smooth gait path), deviates 6-10 in outside 12 in walkway width, or uses an assistive device.   Gait with Vertical Head Turns Performs task with slight change in gait velocity (eg, minor disruption to smooth gait path), deviates 6 - 10 in outside 12 in walkway width or uses assistive device   Gait and Pivot Turn Pivot turns safely in greater than 3 sec and stops with no loss of balance, or pivot turns safely within 3 sec and stops with mild imbalance, requires small steps to catch balance.   Step Over Obstacle Is able to step over one shoe box (4.5 in total height) but must slow down and adjust steps to clear box safely. May require verbal cueing.   Gait with Narrow Base of Support Ambulates less than 4 steps heel to toe or cannot perform without assistance.   Gait with Eyes Closed Walks 20 ft, uses assistive device, slower speed, mild gait deviations, deviates 6-10 in outside 12 in walkway width. Ambulates 20 ft in less than 9 sec but greater than 7 sec.   Ambulating Backwards Walks 20 ft, uses assistive device, slower speed, mild gait deviations, deviates 6-10 in outside 12 in walkway width.   Steps Alternating feet, must use rail.   Total Score 16                           PT Education - 12/05/14 1059    Education provided Yes   Education Details Educated on  results of balance tests, FGA, evaluation findings and POC.     Person(s) Educated Patient   Methods Explanation;Demonstration   Comprehension Verbalized understanding;Returned demonstration          PT Short Term Goals - 12/05/14 1323    PT SHORT TERM GOAL #1   Title Pt will score 20/30 on FGA to improve functional and dynamic balance and decrease falls risk.  (Target date 01/02/15)   Baseline Scored 16/30 on 12/05/14   Time 4   Period Weeks   PT SHORT TERM GOAL #2   Title Pt will initiate HEP to improve balance and decrease falls risk at mod I level.  (Target date 01/02/15)   Time 4   Period Weeks   PT SHORT TERM GOAL #3   Title Pt will improve gait speed to 3.33 to become safer community ambulator and have more efficient ambulation.  (Target date 01/02/15)   Baseline 2.73 ft/sec on 12/05/14   PT SHORT TERM GOAL #4   Title Pt will verbalize understanding of fall prevention strategies to decrease risk of falling in home environment.  (Target date 01/02/15)   Time 4   Period Weeks           PT Long Term Goals - 12/05/14 1548    PT LONG TERM GOAL #1   Title Pt will improve FGA to 23/30 to improve functional and dynamic balance and decrease risk of falls.  (Target date: 01/30/15)   Time 8   Period Weeks   PT LONG TERM GOAL #2   Title Pt will perform HEP independently to improve balance and decrease risk of falls.  (Target date: 01/30/15)   Time 8   Period Weeks   PT LONG TERM GOAL #3   Title Pt will score 71.1% on the ABC scale to indicate increased confidence in balance.  (Target date:  01/30/15)   Time 8   Period Weeks   PT LONG TERM GOAL #4   Title Pt will demonstrate ability to negotiate obstacles including ramp, curb, grass, etc at mod I level to safely traverse community environment.  (Target date: 01/30/15)   Time 8   Period Weeks               Plan - 2015/01/01 1318    Clinical Impression Statement Pt presents with hx of falls, most recent being 4-6 weeks ago per pt  report.  She reports that she has noted a difference in gait and balance beginning one year ago and was referred to OP PT to address balance deficits.  Upon PT eval, note that BERG balance test 53/56, however when given more dynamic and multi task assessment of FGA, score was 16/30, placing pt at significant risk for falls.  Pt will benefit from OP Neuro PT in order to address deficits listed below and decrease fall risk.     Pt will benefit from skilled therapeutic intervention in order to improve on the following deficits Decreased balance;Abnormal gait;Pain;Improper body mechanics;Decreased safety awareness;Dizziness   Rehab Potential Good   Clinical Impairments Affecting Rehab Potential Pt's motivation to participate in home program.    PT Frequency 1x / week   PT Duration 8 weeks   PT Treatment/Interventions Gait training;Stair training;Functional mobility training;Therapeutic exercise;Balance training;Therapeutic activities;Neuromuscular re-education;Patient/family education;Manual techniques;Vestibular   PT Next Visit Plan high level balance with narrowed BOS, compliant surfaces, head turns, provide HEP for balance   Consulted and Agree with Plan of Care Patient          G-Codes - 01/01/15 1043    Functional Assessment Tool Used FGA 16/30    Functional Limitation Mobility: Walking and moving around   Mobility: Walking and Moving Around Current Status [phone]) At least 40 percent but less than 60 percent impaired, limited or restricted   Mobility: Walking and Moving Around Goal Status [phone]) At least 20 percent but less than 40 percent impaired, limited or restricted       Problem List Patient Active Problem List   Diagnosis Date Noted  . Gait disorder 11/06/2014  . Tobacco user 09/08/2013  . Allergic rhinitis 09/08/2013  . Lipid disorder 09/08/2013  . Dyspepsia and disorder of function of stomach 09/08/2013  . Caregiver stress 09/08/2013    Cameron Sprang, PT, MPT [facility] [address] [place], [place], [address] Phone: [phone]   Fax:  [phone] Jan 01, 2015, 4:08 PM

## 2014-12-12 ENCOUNTER — Encounter: Payer: Self-pay | Admitting: Rehabilitation

## 2014-12-12 ENCOUNTER — Ambulatory Visit: Payer: Medicare Other | Admitting: Rehabilitation

## 2014-12-12 DIAGNOSIS — R269 Unspecified abnormalities of gait and mobility: Secondary | ICD-10-CM

## 2014-12-12 DIAGNOSIS — R2681 Unsteadiness on feet: Secondary | ICD-10-CM

## 2014-12-12 NOTE — Patient Instructions (Signed)
Feet Together (Compliant Surface) Arm Motion - Eyes Closed   Stand on compliant surface: ________ with feet together. Close eyes.  Stand in corner of house with chair in front for safety.  Find cushion that you can stand on that is thick enough that you do not sink to floor.  Hold position for 30 secs.  Do once a day, 5 days a week.   Copyright  VHI. All rights reserved.    Copyright  VHI. All rights reserved.  Feet Together (Compliant Surface) Head Motion - Eyes Closed   Stand on compliant surface: with feet together. Close eyes and move head slowly, up and down. Do up/down 15 times then transition to side to side 15 times.  Repeat __15__ times per session. Do ___1_ sessions per day. Do 5 x per week.    Copyright  VHI. All rights reserved.   Feet Heel-Toe "Tandem" (Compliant Surface) Head Motion - Eyes Open   With eyes open, standing on compliant surface:  right or left foot directly in front of the other.  Hold this for 30 secs.  Perform 2 times per session, once a day, 5 days a week.   Feet Heel-Toe "Tandem" (Compliant Surface) Arm Motion - Eyes Closed   Stand on compliant surface: ________ with right foot directly in front of the other and/or vice versa.  Close eyes and hold for 30 secs.  Perform x 2 reps per session, once a day.  Do 5 days a week.    Copyright  VHI. All rights reserved.

## 2014-12-12 NOTE — Therapy (Signed)
[facility] [address] [place], [place], [address] Phone: [phone]   Fax:  [phone]  Physical Therapy Treatment  Patient Details  Name: Jenny Oliver MRN: [id number] Date of Birth: February 08, 1946 Referring Provider:  Thressa Sheller, MD  Encounter Date: 12/12/2014      PT End of Session - 12/12/14 1158    Visit Number 2   Number of Visits 9   Date for PT Re-Evaluation 02/03/15   Authorization Type G-code due every 10th visit   PT Start Time 1014   PT Stop Time 1100   PT Time Calculation (min) 46 min   Activity Tolerance Patient tolerated treatment well   Behavior During Therapy [facility] for tasks assessed/performed      Past Medical History  Diagnosis Date  . GERD (gastroesophageal reflux disease)   . Chronic kidney disease   . Liddle's syndrome   . COPD (chronic obstructive pulmonary disease)   . Vitamin D deficiency   . Vitamin B12 deficiency   . OA (osteoarthritis)   . Sciatica   . Gait disorder 11/06/2014    Past Surgical History  Procedure Laterality Date  . Balloon dilation  11/03/2011    Procedure: BALLOON DILATION;  Surgeon: Arta Silence, MD;  Location: [facility];  Service: Endoscopy;  Laterality: N/A;  . Partial hysterectomy  1994  . Tonsillectomy      There were no vitals filed for this visit.  Visit Diagnosis:  Abnormality of gait  Unsteadiness      Subjective Assessment - 12/12/14 1014    Subjective Pt reports that she doesn't know what she is here and feels that it is not neccessary    Currently in Pain? Yes   Pain Score 2    Pain Location Back   Pain Orientation Lower   Pain Descriptors / Indicators Aching   Pain Onset More than a month ago   Pain Frequency Intermittent   Aggravating Factors  standing and walking   Pain Relieving Factors sitting                          [facility] Adult PT Treatment/Exercise - 12/12/14 1047    Transfers   Transfers Sit to  Stand;Stand to Sit   Sit to Stand 7: Independent   Stand to Sit 7: Independent   Ambulation/Gait   Ambulation/Gait Yes   Ambulation/Gait Assistance 6: Modified independent (Device/Increase time)   Ambulation Distance (Feet) 200 Feet   Assistive device None   Gait Pattern Decreased stride length;Decreased step length - left;Poor foot clearance - left   Ambulation Surface Level;Indoor   Stairs --   Stairs Assistance --   Stair Management Technique --   Number of Stairs --   Height of Stairs --   High Level Balance   High Level Balance Activities Other (comment)   High Level Balance Comments Performed many high level balance activies on foam complaint surface, see pt instructions for further details.  Performed all with 30 sec holds x 2 reps and provided handout.  Progressed to rocker board activity while keeping board still, working on narrowing BOS, EO>EC then progressing to weight shifting while on rocker board ant/post and note pt tends to keep weight post, therefore provided cues and tactile facilitation for light weight shift forward with use of hip strategy.  Therefore, progressed to wall bumps while on foam balance beam x 15 reps.  note initial difficulty maintaining upright position, but with  progression did demonstrate improvement.  Ended with assessment of stepping strategy with resisted forward standing with therapist loosening resistance to assess pt stepping.  Pt did well, therefore progressed to forward marching while tapping up to cone>stepping and alternating LEs.  Requires min A to prevent LOB.                  PT Education - 12/12/14 1157    Education provided Yes   Education Details Education on balance systems, balance strategies, performing HEP at home to increase overall balance function.     Person(s) Educated Patient   Methods Explanation;Demonstration;Handout;Verbal cues   Comprehension Returned demonstration;Verbalized understanding;Need further instruction           PT Short Term Goals - 12/05/14 1323    PT SHORT TERM GOAL #1   Title Pt will score 20/30 on FGA to improve functional and dynamic balance and decrease falls risk.  (Target date 01/02/15)   Baseline Scored 16/30 on 12/05/14   Time 4   Period Weeks   PT SHORT TERM GOAL #2   Title Pt will initiate HEP to improve balance and decrease falls risk at mod I level.  (Target date 01/02/15)   Time 4   Period Weeks   PT SHORT TERM GOAL #3   Title Pt will improve gait speed to 3.33 to become safer community ambulator and have more efficient ambulation.  (Target date 01/02/15)   Baseline 2.73 ft/sec on 12/05/14   PT SHORT TERM GOAL #4   Title Pt will verbalize understanding of fall prevention strategies to decrease risk of falling in home environment.  (Target date 01/02/15)   Time 4   Period Weeks           PT Long Term Goals - 12/05/14 1548    PT LONG TERM GOAL #1   Title Pt will improve FGA to 23/30 to improve functional and dynamic balance and decrease risk of falls.  (Target date: 01/30/15)   Time 8   Period Weeks   PT LONG TERM GOAL #2   Title Pt will perform HEP independently to improve balance and decrease risk of falls.  (Target date: 01/30/15)   Time 8   Period Weeks   PT LONG TERM GOAL #3   Title Pt will score 71.1% on the ABC scale to indicate increased confidence in balance.  (Target date: 01/30/15)   Time 8   Period Weeks   PT LONG TERM GOAL #4   Title Pt will demonstrate ability to negotiate obstacles including ramp, curb, grass, etc at mod I level to safely traverse community environment.  (Target date: 01/30/15)   Time 8   Period Weeks               Plan - 12/12/14 1159    Clinical Impression Statement Skilled session focused on initiation of HEP for improved balance on compliant surface with narrowing BOS and EC as well as assessment of hip and stepping strategy, functional ankle strength, and high stepping/marching tasks to address balance and hip strength.   Continue POC.    Pt will benefit from skilled therapeutic intervention in order to improve on the following deficits Decreased balance;Abnormal gait;Pain;Improper body mechanics;Decreased safety awareness;Dizziness   Rehab Potential Good   Clinical Impairments Affecting Rehab Potential Pt's motivation to participate in home program.    PT Frequency 1x / week   PT Duration 8 weeks   PT Treatment/Interventions Gait training;Stair training;Functional mobility training;Therapeutic exercise;Balance training;Therapeutic activities;Neuromuscular re-education;Patient/family  education;Manual techniques;Vestibular   PT Next Visit Plan Continue with high level balance-gait over grass and varying surfaces, head turns, SOT would be great!!   Consulted and Agree with Plan of Care Patient        Problem List Patient Active Problem List   Diagnosis Date Noted  . Gait disorder 11/06/2014  . Tobacco user 09/08/2013  . Allergic rhinitis 09/08/2013  . Lipid disorder 09/08/2013  . Dyspepsia and disorder of function of stomach 09/08/2013  . Caregiver stress 09/08/2013    Cameron Sprang, PT, MPT [facility] [address] [place], [place], [address] Phone: [phone]   Fax:  [phone] 12/12/2014, 12:03 PM

## 2014-12-16 ENCOUNTER — Ambulatory Visit (INDEPENDENT_AMBULATORY_CARE_PROVIDER_SITE_OTHER): Payer: Medicare Other | Admitting: Licensed Clinical Social Worker

## 2014-12-16 DIAGNOSIS — F332 Major depressive disorder, recurrent severe without psychotic features: Secondary | ICD-10-CM | POA: Diagnosis not present

## 2014-12-18 DIAGNOSIS — M9901 Segmental and somatic dysfunction of cervical region: Secondary | ICD-10-CM | POA: Diagnosis not present

## 2014-12-18 DIAGNOSIS — M25511 Pain in right shoulder: Secondary | ICD-10-CM | POA: Diagnosis not present

## 2014-12-18 DIAGNOSIS — M5032 Other cervical disc degeneration, mid-cervical region: Secondary | ICD-10-CM | POA: Diagnosis not present

## 2014-12-18 DIAGNOSIS — M9902 Segmental and somatic dysfunction of thoracic region: Secondary | ICD-10-CM | POA: Diagnosis not present

## 2014-12-19 DIAGNOSIS — M9902 Segmental and somatic dysfunction of thoracic region: Secondary | ICD-10-CM | POA: Diagnosis not present

## 2014-12-19 DIAGNOSIS — M9901 Segmental and somatic dysfunction of cervical region: Secondary | ICD-10-CM | POA: Diagnosis not present

## 2014-12-19 DIAGNOSIS — M5032 Other cervical disc degeneration, mid-cervical region: Secondary | ICD-10-CM | POA: Diagnosis not present

## 2014-12-19 DIAGNOSIS — M25511 Pain in right shoulder: Secondary | ICD-10-CM | POA: Diagnosis not present

## 2014-12-20 ENCOUNTER — Ambulatory Visit: Payer: Medicare Other | Admitting: Physical Therapy

## 2014-12-20 DIAGNOSIS — R269 Unspecified abnormalities of gait and mobility: Secondary | ICD-10-CM | POA: Diagnosis not present

## 2014-12-20 DIAGNOSIS — R2681 Unsteadiness on feet: Secondary | ICD-10-CM | POA: Diagnosis not present

## 2014-12-20 NOTE — Therapy (Signed)
[place] [facility] [address] [place], [place], [address] Phone: [phone]   Fax:  [phone]  Physical Therapy Treatment  Patient Details  Name: Jenny Oliver MRN: [id number] Date of Birth: 25-Apr-1945 Referring Provider:  Thressa Sheller, MD  Encounter Date: 12/20/2014      PT End of Session - 12/20/14 1956    Visit Number 3   Number of Visits 9   Date for PT Re-Evaluation 02/03/15   Authorization Type G-code due every 10th visit   PT Start Time 1107   PT Stop Time 1155   PT Time Calculation (min) 48 min   Activity Tolerance Other (comment)  Limited by disequilibrium, headache   Behavior During Therapy [facility] for tasks assessed/performed      Past Medical History  Diagnosis Date  . GERD (gastroesophageal reflux disease)   . Chronic kidney disease   . Liddle's syndrome   . COPD (chronic obstructive pulmonary disease)   . Vitamin D deficiency   . Vitamin B12 deficiency   . OA (osteoarthritis)   . Sciatica   . Gait disorder 11/06/2014    Past Surgical History  Procedure Laterality Date  . Balloon dilation  11/03/2011    Procedure: BALLOON DILATION;  Surgeon: Arta Silence, MD;  Location: [facility];  Service: Endoscopy;  Laterality: N/A;  . Partial hysterectomy  1994  . Tonsillectomy      There were no vitals filed for this visit.  Visit Diagnosis:  Abnormality of gait  Unsteadiness      Subjective Assessment - 12/20/14 1952    Subjective "I tried those exercises and I got so dizzy. I mean so, so terribly dizzy. I had to go sit down for a while. I can't do those anymore."   Limitations Walking;House hold activities   Patient Stated Goals "I would like to work on my balance" "to get stronger and correct muscle weakness"    Currently in Pain? No/denies                Vestibular Assessment - 12/20/14 0001    Vestibular Assessment   General Observation Pt reporting severe "dizziness" after  performing home exercjses which involved eyes closed.  Pt also reports tinnitis since 2011 on L > R   Symptom Behavior   Type of Dizziness Imbalance  and "dizziness"   Frequency of Dizziness When pt performs home exercises with eyes closed on foam/tandem stance   Duration of Dizziness 15-20 seconds after pt opened eyes   Relieving Factors Rest  Dizziness returned to baseline after sitting x5 min   Occulomotor Exam   Occulomotor Alignment Normal  appears grossly WNL at rest   Spontaneous Absent   Gaze-induced Absent   Smooth Pursuits Saccades  horizontal   Saccades Poor trajectory  L eye undershoots significantly with superior > R horizontal   Comment has worn glasses since 73 months old due to L eye weakness   Vestibulo-Occular Reflex   VOR 1 Head Only (x 1 viewing) Loses target occasionallly; reports onsets of headache after attempting both horizontal and vertical   VOR Cancellation Normal   Equities trader Comment Unable to complete due to increased dizziness with eyes closed, onset of 6/10 headache when standing with eyes open, surround moving.  headache decreased from 6/10 to 1/10 within minutes                 [facility] Adult PT Treatment/Exercise - 12/20/14 0001  Ambulation/Gait   Ambulation/Gait Yes   Ambulation/Gait Assistance 5: Supervision;6: Modified independent (Device/Increase time);4: Min guard   Ambulation/Gait Assistance Details After attempting SOT on Balance Master, pt with very unsteady gait, nearly colliding with stationary objects in gym.   Ambulation Distance (Feet) 250 Feet   Assistive device None   Gait Pattern Decreased stride length;Decreased step length - left;Poor foot clearance - left   Ambulation Surface Level;Indoor         Vestibular Treatment/Exercise - 12/20/14 0001    Vestibular Treatment/Exercise   Vestibular Treatment Provided Gaze   Gaze Exercises Eye/Head Exercise  Horizontal;Eye/Head Exercise Vertical   Eye/Head Exercise Horizontal   Foot Position shoulder width   Reps 10   Comments Utilized visual targets for spotting to decrease dizziness, headache   Eye/Head Exercise Vertical   Foot Position shoulder width   Reps 10            Balance Exercises - 12/20/14 1946    Balance Exercises: Standing   Standing Eyes Opened Foam/compliant surface;30 secs;Other (comment)  no dizziness on pillow with EO   Standing Eyes Closed Solid surface;Foam/compliant surface;30 secs;Other (comment)  3/10 dizziness on pillow with EC           PT Education - 12/20/14 1949    Education provided Yes   Education Details Modified HEP to increase pt tolerance/compliance. Reiterated education on balance systems and importance of challenging systems (focus on vestibular) to improve balance.   Person(s) Educated Patient   Methods Explanation;Demonstration   Comprehension Verbalized understanding          PT Short Term Goals - 12/05/14 1323    PT SHORT TERM GOAL #1   Title Pt will score 20/30 on FGA to improve functional and dynamic balance and decrease falls risk.  (Target date 01/02/15)   Baseline Scored 16/30 on 12/05/14   Time 4   Period Weeks   PT SHORT TERM GOAL #2   Title Pt will initiate HEP to improve balance and decrease falls risk at mod I level.  (Target date 01/02/15)   Time 4   Period Weeks   PT SHORT TERM GOAL #3   Title Pt will improve gait speed to 3.33 to become safer community ambulator and have more efficient ambulation.  (Target date 01/02/15)   Baseline 2.73 ft/sec on 12/05/14   PT SHORT TERM GOAL #4   Title Pt will verbalize understanding of fall prevention strategies to decrease risk of falling in home environment.  (Target date 01/02/15)   Time 4   Period Weeks           PT Long Term Goals - 12/05/14 1548    PT LONG TERM GOAL #1   Title Pt will improve FGA to 23/30 to improve functional and dynamic balance and decrease risk of  falls.  (Target date: 01/30/15)   Time 8   Period Weeks   PT LONG TERM GOAL #2   Title Pt will perform HEP independently to improve balance and decrease risk of falls.  (Target date: 01/30/15)   Time 8   Period Weeks   PT LONG TERM GOAL #3   Title Pt will score 71.1% on the ABC scale to indicate increased confidence in balance.  (Target date: 01/30/15)   Time 8   Period Weeks   PT LONG TERM GOAL #4   Title Pt will demonstrate ability to negotiate obstacles including ramp, curb, grass, etc at mod I level to safely traverse community environment.  (  Target date: 01/30/15)   Time 8   Period Weeks               Plan - 12/20/14 2000    Clinical Impression Statement Due to pt-reported inability to tolerate home exercises due to significant dizziness, planned to perform SOT to further assess possible origin of symptoms, balance impairments. However, pt unable to tolerate entire SOT due to increased dizziness and headache ("like I have a rubber band around my head that's tightening") upon attempting SOT. Brief vestibular assessment suggestive of visual impairment (pt has worn glasses since 22 months old due to L eye weakness) and increased disequilibrium when visual and somatosensory input decreased/removed. Modified HEP to pt tolerance (> 4/10 dizziness) to increase pt compliance. Continue per POC.   Pt will benefit from skilled therapeutic intervention in order to improve on the following deficits Decreased balance;Abnormal gait;Pain;Improper body mechanics;Decreased safety awareness;Dizziness   Rehab Potential Good   Clinical Impairments Affecting Rehab Potential Pt's motivation to participate in home program.    PT Frequency 1x / week   PT Duration 8 weeks   PT Treatment/Interventions Gait training;Stair training;Functional mobility training;Therapeutic exercise;Balance training;Therapeutic activities;Neuromuscular re-education;Patient/family education;Manual techniques;Vestibular   PT Next  Visit Plan Continue with high level balance-gait over grass and varying surfaces, head turns. Remove somatosensory, visual input to increase pt reliance on vestibular input.    Consulted and Agree with Plan of Care Patient        Problem List Patient Active Problem List   Diagnosis Date Noted  . Gait disorder 11/06/2014  . Tobacco user 09/08/2013  . Allergic rhinitis 09/08/2013  . Lipid disorder 09/08/2013  . Dyspepsia and disorder of function of stomach 09/08/2013  . Caregiver stress 09/08/2013    Billie Ruddy, PT, DPT [facility] [address] [place], [place], [address] Phone: [phone]   Fax:  [phone] 12/20/2014, 8:06 PM

## 2014-12-20 NOTE — Patient Instructions (Signed)
Feet Apart (Compliant Surface) Varied Arm Positions - Eyes Closed   Stand on compliant surface with stable chair in front of you, arms by your side. Close eyes and visualize upright position. Hold_30___ seconds. Repeat _3__ times per session. Do __2__ sessions per day.   Feet Apart, Head Motion - Eyes Open   With eyes open, feet apart, move head slowly: side to side x 10 times. Then up and down x 10 times. Use your "A" as a target to look at when you turn side to side. Do __2__ sessions per day.

## 2014-12-24 ENCOUNTER — Ambulatory Visit (INDEPENDENT_AMBULATORY_CARE_PROVIDER_SITE_OTHER): Payer: Medicare Other | Admitting: Licensed Clinical Social Worker

## 2014-12-24 DIAGNOSIS — F332 Major depressive disorder, recurrent severe without psychotic features: Secondary | ICD-10-CM | POA: Diagnosis not present

## 2014-12-24 DIAGNOSIS — E559 Vitamin D deficiency, unspecified: Secondary | ICD-10-CM | POA: Diagnosis not present

## 2014-12-24 DIAGNOSIS — R7301 Impaired fasting glucose: Secondary | ICD-10-CM | POA: Diagnosis not present

## 2014-12-24 DIAGNOSIS — I10 Essential (primary) hypertension: Secondary | ICD-10-CM | POA: Diagnosis not present

## 2014-12-25 DIAGNOSIS — M9901 Segmental and somatic dysfunction of cervical region: Secondary | ICD-10-CM | POA: Diagnosis not present

## 2014-12-25 DIAGNOSIS — M25511 Pain in right shoulder: Secondary | ICD-10-CM | POA: Diagnosis not present

## 2014-12-25 DIAGNOSIS — M9902 Segmental and somatic dysfunction of thoracic region: Secondary | ICD-10-CM | POA: Diagnosis not present

## 2014-12-25 DIAGNOSIS — M5032 Other cervical disc degeneration, mid-cervical region: Secondary | ICD-10-CM | POA: Diagnosis not present

## 2014-12-26 DIAGNOSIS — E2609 Other primary hyperaldosteronism: Secondary | ICD-10-CM | POA: Diagnosis not present

## 2014-12-26 DIAGNOSIS — I10 Essential (primary) hypertension: Secondary | ICD-10-CM | POA: Diagnosis not present

## 2014-12-26 DIAGNOSIS — R7301 Impaired fasting glucose: Secondary | ICD-10-CM | POA: Diagnosis not present

## 2014-12-26 DIAGNOSIS — E782 Mixed hyperlipidemia: Secondary | ICD-10-CM | POA: Diagnosis not present

## 2014-12-26 DIAGNOSIS — R7302 Impaired glucose tolerance (oral): Secondary | ICD-10-CM | POA: Diagnosis not present

## 2014-12-26 DIAGNOSIS — M8589 Other specified disorders of bone density and structure, multiple sites: Secondary | ICD-10-CM | POA: Diagnosis not present

## 2014-12-26 DIAGNOSIS — F17210 Nicotine dependence, cigarettes, uncomplicated: Secondary | ICD-10-CM | POA: Diagnosis not present

## 2014-12-26 DIAGNOSIS — R7309 Other abnormal glucose: Secondary | ICD-10-CM | POA: Diagnosis not present

## 2014-12-27 ENCOUNTER — Ambulatory Visit: Payer: Medicare Other | Admitting: Physical Therapy

## 2014-12-27 DIAGNOSIS — R2681 Unsteadiness on feet: Secondary | ICD-10-CM

## 2014-12-27 DIAGNOSIS — R269 Unspecified abnormalities of gait and mobility: Secondary | ICD-10-CM

## 2014-12-27 NOTE — Therapy (Signed)
[place] [address] [place], [place], [address] Phone: [phone]   Fax:  [phone]  Physical Therapy Treatment  Patient Details  Name: Jenny Oliver MRN: [id number] Date of Birth: 1946-02-06 Referring Provider:  Thressa Sheller, MD  Encounter Date: 12/27/2014      PT End of Session - 12/27/14 1934    Visit Number 4   Number of Visits 9   Date for PT Re-Evaluation 02/03/15   Authorization Type G-code due every 10th visit   PT Start Time 1102   PT Stop Time 1147   PT Time Calculation (min) 45 min   Activity Tolerance Other (comment)  limited by dizziness, disequilibrium   Behavior During Therapy Anxious      Past Medical History  Diagnosis Date  . GERD (gastroesophageal reflux disease)   . Chronic kidney disease   . Liddle's syndrome   . COPD (chronic obstructive pulmonary disease)   . Vitamin D deficiency   . Vitamin B12 deficiency   . OA (osteoarthritis)   . Sciatica   . Gait disorder 11/06/2014    Past Surgical History  Procedure Laterality Date  . Balloon dilation  11/03/2011    Procedure: BALLOON DILATION;  Surgeon: Arta Silence, MD;  Location: [facility];  Service: Endoscopy;  Laterality: N/A;  . Partial hysterectomy  1994  . Tonsillectomy      There were no vitals filed for this visit.  Visit Diagnosis:  Abnormality of gait  Unsteadiness      Subjective Assessment - 12/27/14 1105    Subjective Pt states, "Things have gotten real bad. Much worse." Upon further questioning, pt reporting having gotten "real dizzy when I reached up to adjust the shower head." Pt states, "I almost fell out of the shower." Pt also reports "15 out of 10" headache that started yesterday afternoon and persisted until today, When asked about home exercises, pt replied, "I can't do those. I can't turn my head. And I can't stand there with my eyes closed because it feels like the ground is shaking. Like an  earthquake."  When educated on importance of adequate hydration to prevent orthostasis, pt replied, "I literally can't make myself drink more than one cup of water per day."   Limitations Walking;House hold activities   Patient Stated Goals "I would like to work on my balance" "to get stronger and correct muscle weakness"    Currently in Pain? Yes   Pain Score 10-Worst pain ever   Pain Location Generalized   Pain Descriptors / Indicators Aching   Pain Type Chronic pain   Pain Onset More than a month ago   Pain Frequency Constant                Vestibular Assessment - 12/27/14 0001    Vestibular Assessment   General Observation Pt reporting significant dizziness when looking upward   Positional Testing   Dix-Hallpike Dix-Hallpike Right;Dix-Hallpike Left   Sidelying Test Sidelying Right;Sidelying Left   Dix-Hallpike Right   Dix-Hallpike Right Symptoms No nystagmus;Other (comment)  asymptomatic   Dix-Hallpike Left   Dix-Hallpike Left Symptoms No nystagmus;Other (comment)  asymptomatic   Sidelying Right   Sidelying Right Symptoms No nystagmus;Other (comment)  asymptomatic   Sidelying Left   Sidelying Left Symptoms No nystagmus;Other (comment)  asymptomatic   Orthostatics   BP supine (x 5 minutes) 138/82 mmHg   HR supine (x 5 minutes) 70   BP standing (after 1 minute) 110/78 mmHg  reports dizziness,"but  not the same way I've been feeling"   HR standing (after 1 minute) 82   BP standing (after 3 minutes) 130/80 mmHg  dizziness resolved   HR standing (after 3 minutes) 78                 [facility] Adult PT Treatment/Exercise - 12/27/14 0001    Ambulation/Gait   Ambulation/Gait Yes   Ambulation/Gait Assistance 6: Modified independent (Device/Increase time)   Ambulation Distance (Feet) 400 Feet   Assistive device None   Gait Pattern Decreased stride length;Decreased step length - left;Poor foot clearance - left   Ambulation Surface Level;Indoor   Gait Comments  During gait, pt able to tolerate quarter turns with visual spotting and use of compensatory strategy (turning eyes, then head, then body) during quarter turns. Pt denied dizziness when using said strategies.         Vestibular Treatment/Exercise - 12/27/14 0001    Vestibular Treatment/Exercise   Vestibular Treatment Provided Gaze   Gaze Exercises X1 Viewing Horizontal;X1 Viewing Vertical   X1 Viewing Horizontal   Foot Position wide BOS   Reps 10   Comments slow, small amplitude head movements to prevent dizziness/disequilibium   X1 Viewing Vertical   Foot Position wide BOS   Reps 10   Comments slow, small amplitude head movements; see above,   Eye/Head Exercise Horizontal   Comments Deferred due to pt anxiety   Eye/Head Exercise Vertical   Comments Deferred due to pt anxiety            Balance Exercises - 12/27/14 1935    Balance Exercises: Standing   Standing Eyes Opened Foam/compliant surface;30 secs;Other (comment)  no dizziness on pillow with EO   Standing Eyes Closed --  deferred due to pt anxiety           PT Education - 12/27/14 1923    Education provided Yes   Education Details HEP further modified to include only what pt was able to tolerate and felt safe performing during PT. Recommending pt increase fluid intake to prevent orthostatic hypotension.   Person(s) Educated Patient   Methods Explanation;Demonstration;Verbal cues;Handout   Comprehension Verbalized understanding;Need further instruction          PT Short Term Goals - 12/05/14 1323    PT SHORT TERM GOAL #1   Title Pt will score 20/30 on FGA to improve functional and dynamic balance and decrease falls risk.  (Target date 01/02/15)   Baseline Scored 16/30 on 12/05/14   Time 4   Period Weeks   PT SHORT TERM GOAL #2   Title Pt will initiate HEP to improve balance and decrease falls risk at mod I level.  (Target date 01/02/15)   Time 4   Period Weeks   PT SHORT TERM GOAL #3   Title Pt will  improve gait speed to 3.33 to become safer community ambulator and have more efficient ambulation.  (Target date 01/02/15)   Baseline 2.73 ft/sec on 12/05/14   PT SHORT TERM GOAL #4   Title Pt will verbalize understanding of fall prevention strategies to decrease risk of falling in home environment.  (Target date 01/02/15)   Time 4   Period Weeks           PT Long Term Goals - 12/05/14 1548    PT LONG TERM GOAL #1   Title Pt will improve FGA to 23/30 to improve functional and dynamic balance and decrease risk of falls.  (Target date: 01/30/15)  Time 8   Period Weeks   PT LONG TERM GOAL #2   Title Pt will perform HEP independently to improve balance and decrease risk of falls.  (Target date: 01/30/15)   Time 8   Period Weeks   PT LONG TERM GOAL #3   Title Pt will score 71.1% on the ABC scale to indicate increased confidence in balance.  (Target date: 01/30/15)   Time 8   Period Weeks   PT LONG TERM GOAL #4   Title Pt will demonstrate ability to negotiate obstacles including ramp, curb, grass, etc at mod I level to safely traverse community environment.  (Target date: 01/30/15)   Time 8   Period Weeks               Plan - 12/27/14 1941    Clinical Impression Statement Pt again reporting inability to tolerate HEP (head turns with spotting; and standing wide BOS on pillow with EC); therefore, HEP further modified to static standing on pillow with EO, and slow small amplitude head turns x10. Tests for BPPV negative. BP did decrease from 138/82 to 110/78 with transtion from  supine > stand. Provided education for importance of hydration to prevent orthostatic hypotension, as pt only drinks 1 cup water/day. Continue per POC.    Pt will benefit from skilled therapeutic intervention in order to improve on the following deficits Decreased balance;Abnormal gait;Pain;Improper body mechanics;Decreased safety awareness;Dizziness   Rehab Potential Good   Clinical Impairments Affecting Rehab  Potential Pt's motivation to participate in home program.    PT Frequency 1x / week   PT Duration 8 weeks   PT Treatment/Interventions Gait training;Stair training;Functional mobility training;Therapeutic exercise;Balance training;Therapeutic activities;Neuromuscular re-education;Patient/family education;Manual techniques;Vestibular   PT Next Visit Plan Continue with high level balance-gait over grass and varying surfaces, head turns. Remove somatosensory, visual input to increase pt reliance on vestibular input.    Consulted and Agree with Plan of Care Patient        Problem List Patient Active Problem List   Diagnosis Date Noted  . Gait disorder 11/06/2014  . Tobacco user 09/08/2013  . Allergic rhinitis 09/08/2013  . Lipid disorder 09/08/2013  . Dyspepsia and disorder of function of stomach 09/08/2013  . Caregiver stress 09/08/2013    Billie Ruddy, PT, DPT [facility] [address] [place], [place], [address] Phone: [phone]   Fax:  [phone] 12/27/2014, 7:48 PM

## 2014-12-27 NOTE — Patient Instructions (Addendum)
Gaze stabilization: Standing Feet Apart    Do this exercise with your glasses on. Stand with feet shoulder width apart, stable chair in front of you, keeping eyes on target on wall ("A") __6__ feet away, move head side to side slowly 10 times. Repeat while moving head up and down 10 times. Slow down/stop if your dizziness increases to more than 5/10, you feel very off-balance, or you start to get a headache.  Do _2_ sessions per day.   Feet Apart (Compliant Surface) Varied Arm Positions - Eyes open   Stand on compliant surface with stable chair in front of you, arms by your side. Keep eyes open. Hold_30___ seconds. Repeat _3__ times per session. Do __2__ sessions per day.

## 2014-12-30 DIAGNOSIS — M25511 Pain in right shoulder: Secondary | ICD-10-CM | POA: Diagnosis not present

## 2014-12-30 DIAGNOSIS — M9901 Segmental and somatic dysfunction of cervical region: Secondary | ICD-10-CM | POA: Diagnosis not present

## 2014-12-30 DIAGNOSIS — M9902 Segmental and somatic dysfunction of thoracic region: Secondary | ICD-10-CM | POA: Diagnosis not present

## 2014-12-30 DIAGNOSIS — M5032 Other cervical disc degeneration, mid-cervical region, unspecified level: Secondary | ICD-10-CM | POA: Diagnosis not present

## 2014-12-31 DIAGNOSIS — M5032 Other cervical disc degeneration, mid-cervical region, unspecified level: Secondary | ICD-10-CM | POA: Diagnosis not present

## 2014-12-31 DIAGNOSIS — M25511 Pain in right shoulder: Secondary | ICD-10-CM | POA: Diagnosis not present

## 2014-12-31 DIAGNOSIS — M9901 Segmental and somatic dysfunction of cervical region: Secondary | ICD-10-CM | POA: Diagnosis not present

## 2014-12-31 DIAGNOSIS — M9902 Segmental and somatic dysfunction of thoracic region: Secondary | ICD-10-CM | POA: Diagnosis not present

## 2015-01-01 ENCOUNTER — Ambulatory Visit: Payer: Medicare Other | Admitting: Licensed Clinical Social Worker

## 2015-01-03 ENCOUNTER — Ambulatory Visit: Payer: Medicare Other | Attending: Neurology | Admitting: Rehabilitation

## 2015-01-03 ENCOUNTER — Encounter: Payer: Self-pay | Admitting: Rehabilitation

## 2015-01-03 DIAGNOSIS — R269 Unspecified abnormalities of gait and mobility: Secondary | ICD-10-CM | POA: Diagnosis not present

## 2015-01-03 DIAGNOSIS — R2681 Unsteadiness on feet: Secondary | ICD-10-CM | POA: Diagnosis not present

## 2015-01-03 NOTE — Therapy (Signed)
[facility] [address] [place], [place], [address] Phone: [phone]   Fax:  [phone]  Physical Therapy Treatment  Patient Details  Name: Jenny Oliver MRN: [id number] Date of Birth: 29-Oct-1945 Referring Provider:  Thressa Sheller, MD  Encounter Date: 01/03/2015      PT End of Session - 01/03/15 1300    Visit Number 5   Number of Visits 9   Date for PT Re-Evaluation 02/03/15   Authorization Type G-code due every 10th visit   PT Start Time 1100   PT Stop Time 1145   PT Time Calculation (min) 45 min   Activity Tolerance Other (comment)  limited by dizziness, headache   Behavior During Therapy Anxious      Past Medical History  Diagnosis Date  . GERD (gastroesophageal reflux disease)   . Chronic kidney disease   . 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 COPD (chronic obstructive pulmonary disease) ([place])   . Vitamin D deficiency   . Vitamin B12 deficiency   . OA (osteoarthritis)   . Sciatica   . Gait disorder 11/06/2014    Past Surgical History  Procedure Laterality Date  . Balloon dilation  11/03/2011    Procedure: BALLOON DILATION;  Surgeon: Arta Silence, MD;  Location: [facility];  Service: Endoscopy;  Laterality: N/A;  . Partial hysterectomy  1994  . Tonsillectomy      There were no vitals filed for this visit.  Visit Diagnosis:  Abnormality of gait  Unsteadiness  Gait disorder      Subjective Assessment - 01/03/15 1107    Subjective "I am not going to come back after today."  "I'm doing much worse, the dizziness is worse, my balance is worse, and my headaches are still there."     Limitations Walking;House hold activities   Patient Stated Goals "I would like to work on my balance" "to get stronger and correct muscle weakness"    Currently in Pain? Yes   Pain Score 5    Pain Location Head   Pain Orientation Anterior   Pain Descriptors / Indicators Aching   Pain Type Chronic pain   Pain Onset  More than a month ago   Pain Frequency Intermittent   Aggravating Factors  stress    Pain Relieving Factors resting            [facility] PT Assessment - 01/03/15 0001    Functional Gait  Assessment   Gait assessed  Yes   Gait Level Surface Walks 20 ft in less than 7 sec but greater than 5.5 sec, uses assistive device, slower speed, mild gait deviations, or deviates 6-10 in outside of the 12 in walkway width.   Change in Gait Speed Makes only minor adjustments to walking speed, or accomplishes a change in speed with significant gait deviations, deviates 10-15 in outside the 12 in walkway width, or changes speed but loses balance but is able to recover and continue walking.   Gait with Horizontal Head Turns Performs head turns smoothly with slight change in gait velocity (eg, minor disruption to smooth gait path), deviates 6-10 in outside 12 in walkway width, or uses an assistive device.   Gait with Vertical Head Turns Performs task with slight change in gait velocity (eg, minor disruption to smooth gait path), deviates 6 - 10 in outside 12 in walkway width or uses assistive device   Gait and Pivot Turn Pivot turns safely in greater than 3 sec and stops with no  loss of balance, or pivot turns safely within 3 sec and stops with mild imbalance, requires small steps to catch balance.   Step Over Obstacle Is able to step over one shoe box (4.5 in total height) but must slow down and adjust steps to clear box safely. May require verbal cueing.   Gait with Narrow Base of Support Ambulates 7-9 steps.   Gait with Eyes Closed Walks 20 ft, uses assistive device, slower speed, mild gait deviations, deviates 6-10 in outside 12 in walkway width. Ambulates 20 ft in less than 9 sec but greater than 7 sec.   Ambulating Backwards Walks 20 ft, no assistive devices, good speed, no evidence for imbalance, normal gait   Steps Alternating feet, must use rail.   Total Score 19                     [facility]  Adult PT Treatment/Exercise - 01/03/15 1100    Ambulation/Gait   Ambulation/Gait Yes   Ambulation/Gait Assistance 6: Modified independent (Device/Increase time)   Ambulation Distance (Feet) 400 Feet   Assistive device None   Gait Pattern Decreased stride length;Decreased step length - left;Poor foot clearance - left   Gait velocity 2.52 ft/sec    Gait Comments Performed distance above during FGA activities, see below for details. Also went over fall prevention strategies during session to address STG.  Pt able to verbalize good understanding of things she is doing at home to prevent fall and ensure safety when needing to step up/down.     Self-Care   Self-Care Other Self-Care Comments   Other Self-Care Comments  Had long discussion with pt regarding dizziness, the fact that it could possibly be medically related or BP related.  Recommended her to see her PCP, which she does have appt with next month.  Note that BP today was 138/98, which is significantly higher than usual for her and is higher than last session (pt orthostatic during last session).  Pt feels that balance has gotten better, but dizziness is much worse.  Discussed holding therapy until she sees her PCP for further guidance medically on what may be happening.  Pt wanting to keep her visits, but continue to educate that she would need to ensure that she is doing her part at home or that therapy would not provide any longer term carryover.  Pt verblized understanding and wishes to keep her visits.                  PT Education - 01/03/15 1259    Education provided Yes   Education Details Educated on possible BP issues and to report this to PCP, long discussion on therapy potential but barriers currently limiting progress, continue to educate on fluid intake, see self care for more details.     Person(s) Educated Patient   Methods Explanation   Comprehension Verbalized understanding          PT Short Term Goals - 01/03/15  1109    PT SHORT TERM GOAL #1   Title Pt will score 20/30 on FGA to improve functional and dynamic balance and decrease falls risk.  (Target date 01/02/15)   Baseline 19/30 on 01/03/15   Status Not Met   PT SHORT TERM GOAL #2   Title Pt will initiate HEP to improve balance and decrease falls risk at mod I level.  (Target date 01/02/15)   Baseline reports trying to do them daily, however if they make her dizzy, she  discontinues them.  (01/03/15)   Status Achieved   PT SHORT TERM GOAL #3   Title Pt will improve gait speed to 3.33 to become safer community ambulator and have more efficient ambulation.  (Target date 01/02/15)   Baseline 2.52 ft/sec on 01/03/15   Status Not Met   PT SHORT TERM GOAL #4   Title Pt will verbalize understanding of fall prevention strategies to decrease risk of falling in home environment.  (Target date 01/02/15)   Baseline 01/03/15   Status Achieved           PT Long Term Goals - 12/05/14 1548    PT LONG TERM GOAL #1   Title Pt will improve FGA to 23/30 to improve functional and dynamic balance and decrease risk of falls.  (Target date: 01/30/15)   Time 8   Period Weeks   PT LONG TERM GOAL #2   Title Pt will perform HEP independently to improve balance and decrease risk of falls.  (Target date: 01/30/15)   Time 8   Period Weeks   PT LONG TERM GOAL #3   Title Pt will score 71.1% on the ABC scale to indicate increased confidence in balance.  (Target date: 01/30/15)   Time 8   Period Weeks   PT LONG TERM GOAL #4   Title Pt will demonstrate ability to negotiate obstacles including ramp, curb, grass, etc at mod I level to safely traverse community environment.  (Target date: 01/30/15)   Time 8   Period Weeks               Plan - 01/03/15 1300    Clinical Impression Statement Pt continues to discuss that dizziness is worse than when she began therapy.  Had long discussion with pt regarding current issues and possible BP issues contributing to dizziness.   Recommended that she see her PCP regarding this.  Offered to hold PT until pt sees PCP to ensure that BP or other medical issue not causing dizziness.  Pt wanting to keep current visits.  Pt as met 2/4 STG's for fall prevention strategies and HEP, however gait speed has gotten slower and did not meet FGA score by one point.  Feel that she could continue to benefit, however will be dependent upon pt compliance.     Pt will benefit from skilled therapeutic intervention in order to improve on the following deficits Decreased balance;Abnormal gait;Pain;Improper body mechanics;Decreased safety awareness;Dizziness   Rehab Potential Good   Clinical Impairments Affecting Rehab Potential Pt's motivation to participate in home program.    PT Frequency 1x / week   PT Duration 8 weeks   PT Treatment/Interventions Gait training;Stair training;Functional mobility training;Therapeutic exercise;Balance training;Therapeutic activities;Neuromuscular re-education;Patient/family education;Manual techniques;Vestibular   PT Next Visit Plan Continue with high level balance-gait over grass and varying surfaces, head turns. Remove somatosensory, visual input to increase pt reliance on vestibular input.    Consulted and Agree with Plan of Care Patient        Problem List Patient Active Problem List   Diagnosis Date Noted  . Gait disorder 11/06/2014  . Tobacco user 09/08/2013  . Allergic rhinitis 09/08/2013  . Lipid disorder 09/08/2013  . Dyspepsia and disorder of function of stomach 09/08/2013  . Caregiver stress 09/08/2013    Cameron Sprang, PT, MPT [facility] [address] [place], [place], [address] Phone: [phone]   Fax:  [phone] 01/03/2015, 1:05 PM

## 2015-01-08 ENCOUNTER — Other Ambulatory Visit: Payer: Self-pay | Admitting: Dermatology

## 2015-01-08 DIAGNOSIS — L988 Other specified disorders of the skin and subcutaneous tissue: Secondary | ICD-10-CM | POA: Diagnosis not present

## 2015-01-08 DIAGNOSIS — D485 Neoplasm of uncertain behavior of skin: Secondary | ICD-10-CM | POA: Diagnosis not present

## 2015-01-10 ENCOUNTER — Ambulatory Visit: Payer: Self-pay | Admitting: Physical Therapy

## 2015-01-14 DIAGNOSIS — M9901 Segmental and somatic dysfunction of cervical region: Secondary | ICD-10-CM | POA: Diagnosis not present

## 2015-01-14 DIAGNOSIS — M25511 Pain in right shoulder: Secondary | ICD-10-CM | POA: Diagnosis not present

## 2015-01-14 DIAGNOSIS — M9902 Segmental and somatic dysfunction of thoracic region: Secondary | ICD-10-CM | POA: Diagnosis not present

## 2015-01-14 DIAGNOSIS — M5032 Other cervical disc degeneration, mid-cervical region, unspecified level: Secondary | ICD-10-CM | POA: Diagnosis not present

## 2015-01-17 ENCOUNTER — Ambulatory Visit: Payer: Self-pay | Admitting: Physical Therapy

## 2015-01-24 ENCOUNTER — Ambulatory Visit: Payer: Self-pay | Admitting: Rehabilitation

## 2015-01-24 DIAGNOSIS — M9902 Segmental and somatic dysfunction of thoracic region: Secondary | ICD-10-CM | POA: Diagnosis not present

## 2015-01-24 DIAGNOSIS — M5032 Other cervical disc degeneration, mid-cervical region, unspecified level: Secondary | ICD-10-CM | POA: Diagnosis not present

## 2015-01-24 DIAGNOSIS — M25511 Pain in right shoulder: Secondary | ICD-10-CM | POA: Diagnosis not present

## 2015-01-24 DIAGNOSIS — M9901 Segmental and somatic dysfunction of cervical region: Secondary | ICD-10-CM | POA: Diagnosis not present

## 2015-01-31 ENCOUNTER — Ambulatory Visit: Payer: Self-pay | Admitting: Physical Therapy

## 2015-02-03 DIAGNOSIS — M25511 Pain in right shoulder: Secondary | ICD-10-CM | POA: Diagnosis not present

## 2015-02-03 DIAGNOSIS — M5032 Other cervical disc degeneration, mid-cervical region, unspecified level: Secondary | ICD-10-CM | POA: Diagnosis not present

## 2015-02-03 DIAGNOSIS — M9901 Segmental and somatic dysfunction of cervical region: Secondary | ICD-10-CM | POA: Diagnosis not present

## 2015-02-03 DIAGNOSIS — M9902 Segmental and somatic dysfunction of thoracic region: Secondary | ICD-10-CM | POA: Diagnosis not present

## 2015-02-11 DIAGNOSIS — E559 Vitamin D deficiency, unspecified: Secondary | ICD-10-CM | POA: Diagnosis not present

## 2015-02-11 DIAGNOSIS — E785 Hyperlipidemia, unspecified: Secondary | ICD-10-CM | POA: Diagnosis not present

## 2015-02-11 DIAGNOSIS — N390 Urinary tract infection, site not specified: Secondary | ICD-10-CM | POA: Diagnosis not present

## 2015-02-11 DIAGNOSIS — Z1389 Encounter for screening for other disorder: Secondary | ICD-10-CM | POA: Diagnosis not present

## 2015-02-11 DIAGNOSIS — M8580 Other specified disorders of bone density and structure, unspecified site: Secondary | ICD-10-CM | POA: Diagnosis not present

## 2015-02-11 DIAGNOSIS — E538 Deficiency of other specified B group vitamins: Secondary | ICD-10-CM | POA: Diagnosis not present

## 2015-02-11 DIAGNOSIS — M859 Disorder of bone density and structure, unspecified: Secondary | ICD-10-CM | POA: Diagnosis not present

## 2015-02-11 DIAGNOSIS — F17210 Nicotine dependence, cigarettes, uncomplicated: Secondary | ICD-10-CM | POA: Diagnosis not present

## 2015-02-11 DIAGNOSIS — Z0000 Encounter for general adult medical examination without abnormal findings: Secondary | ICD-10-CM | POA: Diagnosis not present

## 2015-02-12 DIAGNOSIS — M5032 Other cervical disc degeneration, mid-cervical region, unspecified level: Secondary | ICD-10-CM | POA: Diagnosis not present

## 2015-02-12 DIAGNOSIS — M9901 Segmental and somatic dysfunction of cervical region: Secondary | ICD-10-CM | POA: Diagnosis not present

## 2015-02-12 DIAGNOSIS — M25511 Pain in right shoulder: Secondary | ICD-10-CM | POA: Diagnosis not present

## 2015-02-12 DIAGNOSIS — M9902 Segmental and somatic dysfunction of thoracic region: Secondary | ICD-10-CM | POA: Diagnosis not present

## 2015-02-18 DIAGNOSIS — M8580 Other specified disorders of bone density and structure, unspecified site: Secondary | ICD-10-CM | POA: Diagnosis not present

## 2015-02-18 DIAGNOSIS — F17210 Nicotine dependence, cigarettes, uncomplicated: Secondary | ICD-10-CM | POA: Diagnosis not present

## 2015-02-18 DIAGNOSIS — J449 Chronic obstructive pulmonary disease, unspecified: Secondary | ICD-10-CM | POA: Diagnosis not present

## 2015-02-18 DIAGNOSIS — N182 Chronic kidney disease, stage 2 (mild): Secondary | ICD-10-CM | POA: Diagnosis not present

## 2015-02-18 DIAGNOSIS — E785 Hyperlipidemia, unspecified: Secondary | ICD-10-CM | POA: Diagnosis not present

## 2015-02-24 DIAGNOSIS — M50322 Other cervical disc degeneration at C5-C6 level: Secondary | ICD-10-CM | POA: Diagnosis not present

## 2015-02-24 DIAGNOSIS — M9901 Segmental and somatic dysfunction of cervical region: Secondary | ICD-10-CM | POA: Diagnosis not present

## 2015-02-24 DIAGNOSIS — M25511 Pain in right shoulder: Secondary | ICD-10-CM | POA: Diagnosis not present

## 2015-02-24 DIAGNOSIS — M9902 Segmental and somatic dysfunction of thoracic region: Secondary | ICD-10-CM | POA: Diagnosis not present

## 2015-03-12 DIAGNOSIS — M9902 Segmental and somatic dysfunction of thoracic region: Secondary | ICD-10-CM | POA: Diagnosis not present

## 2015-03-12 DIAGNOSIS — M25511 Pain in right shoulder: Secondary | ICD-10-CM | POA: Diagnosis not present

## 2015-03-12 DIAGNOSIS — M9901 Segmental and somatic dysfunction of cervical region: Secondary | ICD-10-CM | POA: Diagnosis not present

## 2015-03-12 DIAGNOSIS — M50322 Other cervical disc degeneration at C5-C6 level: Secondary | ICD-10-CM | POA: Diagnosis not present

## 2015-03-13 DIAGNOSIS — N182 Chronic kidney disease, stage 2 (mild): Secondary | ICD-10-CM | POA: Diagnosis not present

## 2015-03-13 DIAGNOSIS — E785 Hyperlipidemia, unspecified: Secondary | ICD-10-CM | POA: Diagnosis not present

## 2015-03-13 DIAGNOSIS — J449 Chronic obstructive pulmonary disease, unspecified: Secondary | ICD-10-CM | POA: Diagnosis not present

## 2015-03-13 DIAGNOSIS — F17210 Nicotine dependence, cigarettes, uncomplicated: Secondary | ICD-10-CM | POA: Diagnosis not present

## 2015-04-01 ENCOUNTER — Encounter: Payer: PRIVATE HEALTH INSURANCE | Admitting: Rehabilitation

## 2015-04-01 NOTE — Therapy (Signed)
[facility] [address] [place], [place], [address] Phone: [phone]   Fax:  [phone]  Patient Details  Name: Jenny Oliver MRN: [id number] Date of Birth: Oct 24, 1945 Referring Provider:  No ref. provider found  Encounter Date: 04/01/2015    PHYSICAL THERAPY DISCHARGE SUMMARY  Visits from Start of Care: 5  Current functional level related to goals / functional outcomes: PT Long Term Goals - 12/05/14 1548    PT LONG TERM GOAL #1   Title Pt will improve FGA to 23/30 to improve functional and dynamic balance and decrease risk of falls. (Target date: 01/30/15)   Time 8   Period Weeks   PT LONG TERM GOAL #2   Title Pt will perform HEP independently to improve balance and decrease risk of falls. (Target date: 01/30/15)   Time 8   Period Weeks   PT LONG TERM GOAL #3   Title Pt will score 71.1% on the ABC scale to indicate increased confidence in balance. (Target date: 01/30/15)   Time 8   Period Weeks   PT LONG TERM GOAL #4   Title Pt will demonstrate ability to negotiate obstacles including ramp, curb, grass, etc at mod I level to safely traverse community environment. (Target date: 01/30/15)   Time 8   Period Weeks            Remaining deficits: Pt continued to have high level balance and vestibular deficits, but was insistent on not returning stating that "therapy is making me worse."    Education / Equipment: HEP, education on vestibular deficits.    Plan: Patient agrees to discharge.  Patient goals were not met. Patient is being discharged due to not returning since the last visit.  ?????              Cameron Sprang, PT, MPT [facility] [address] [place], [place], [address] Phone: [phone]   Fax:  [phone] 04/01/2015, 9:15 AM

## 2015-04-09 DIAGNOSIS — M9901 Segmental and somatic dysfunction of cervical region: Secondary | ICD-10-CM | POA: Diagnosis not present

## 2015-04-09 DIAGNOSIS — M25511 Pain in right shoulder: Secondary | ICD-10-CM | POA: Diagnosis not present

## 2015-04-09 DIAGNOSIS — M50322 Other cervical disc degeneration at C5-C6 level: Secondary | ICD-10-CM | POA: Diagnosis not present

## 2015-04-09 DIAGNOSIS — M9902 Segmental and somatic dysfunction of thoracic region: Secondary | ICD-10-CM | POA: Diagnosis not present

## 2015-04-11 DIAGNOSIS — M9901 Segmental and somatic dysfunction of cervical region: Secondary | ICD-10-CM | POA: Diagnosis not present

## 2015-04-11 DIAGNOSIS — M50322 Other cervical disc degeneration at C5-C6 level: Secondary | ICD-10-CM | POA: Diagnosis not present

## 2015-04-11 DIAGNOSIS — M25511 Pain in right shoulder: Secondary | ICD-10-CM | POA: Diagnosis not present

## 2015-04-11 DIAGNOSIS — M9902 Segmental and somatic dysfunction of thoracic region: Secondary | ICD-10-CM | POA: Diagnosis not present

## 2015-04-24 ENCOUNTER — Ambulatory Visit (INDEPENDENT_AMBULATORY_CARE_PROVIDER_SITE_OTHER): Payer: Medicare Other | Admitting: Neurology

## 2015-04-24 ENCOUNTER — Encounter: Payer: Self-pay | Admitting: Neurology

## 2015-04-24 VITALS — BP 140/88 | HR 78 | Ht 63.0 in | Wt 150.0 lb

## 2015-04-24 DIAGNOSIS — G4700 Insomnia, unspecified: Secondary | ICD-10-CM

## 2015-04-24 DIAGNOSIS — R269 Unspecified abnormalities of gait and mobility: Secondary | ICD-10-CM

## 2015-04-24 DIAGNOSIS — F5104 Psychophysiologic insomnia: Secondary | ICD-10-CM

## 2015-04-24 DIAGNOSIS — H8110 Benign paroxysmal vertigo, unspecified ear: Secondary | ICD-10-CM | POA: Diagnosis not present

## 2015-04-24 HISTORY — DX: Benign paroxysmal vertigo, unspecified ear: H81.10

## 2015-04-24 HISTORY — DX: Psychophysiologic insomnia: F51.04

## 2015-04-24 MED ORDER — MECLIZINE HCL 25 MG PO TABS: 25.0000 mg | ORAL_TABLET | Freq: Three times a day (TID) | ORAL | Status: DC

## 2015-04-24 NOTE — Progress Notes (Signed)
Reason for visit: Gait DisorderKaren JENNIFERLEE Oliver is an 70 y.o. female  History of present illness:  Jenny Oliver is a 70 year old right-handed black female with a history of a gait disorder that appears to be intermittent nature. The patient has episodes where she might stumble and fall. The patient had one fall while in the grocery store in December 2016. The patient feels as if she catches her foot on something when she falls. The patient also reports episodes of what sound like vertigo that may occur with looking up or stooping over. The patient may have to close her eyes during the event, and she gets blurred vision, and she may have some nausea. The episodes will pass in one or 2 minutes. The patient is having almost daily events at this point. She does not clearly relate the episodes of dizziness to the falls. The patient underwent physical therapy for balance training, she had a lot of dizziness with the therapy, they did a vestibular assessment, should they found no nystagmus with head maneuvers. The patient also reports chronic insomnia. She comes to this office for an evaluation.  Past Medical History  Diagnosis Date  . GERD (gastroesophageal reflux disease)   . Chronic kidney disease   . 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 COPD (chronic obstructive pulmonary disease) ([place])   . Vitamin D deficiency   . Vitamin B12 deficiency   . OA (osteoarthritis)   . Sciatica   . Gait disorder 11/06/2014  . Benign paroxysmal positional vertigo 04/24/2015  . Chronic insomnia 04/24/2015    Past Surgical History  Procedure Laterality Date  . Balloon dilation  11/03/2011    Procedure: BALLOON DILATION;  Surgeon: Arta Silence, MD;  Location: [facility];  Service: Endoscopy;  Laterality: N/A;  . Partial hysterectomy  1994  . Tonsillectomy      Family History  Problem Relation Age of Onset  . Heart disease Mother   . Hyperlipidemia Mother   . Cancer Father     stomach  . Healthy Brother     Social  history:  reports that she has been smoking.  She has never used smokeless tobacco. She reports that she does not drink alcohol or use illicit drugs.    Allergies  Allergen Reactions  . Crestor [Rosuvastatin]   . Lipitor [Atorvastatin]   . Milk-Related Compounds   . Other     Bread, peanuts/tree nuts  . Zetia [Ezetimibe]     Medications:  Prior to Admission medications   Medication Sig Start Date End Date Taking? Authorizing Provider  acetaminophen (TYLENOL) 650 MG CR tablet Take 1,300 mg by mouth daily.     Historical Provider, MD  albuterol (PROVENTIL HFA;VENTOLIN HFA) 108 (90 BASE) MCG/ACT inhaler Inhale 2 puffs into the lungs every 6 (six) hours as needed for wheezing or shortness of breath. Patient not taking: Reported on 12/05/2014 09/06/13   Barton Fanny, MD  aMILoride (MIDAMOR) 5 MG tablet Take 5 mg by mouth daily.    Historical Provider, MD  Calcium Carb-Cholecalciferol (CALCIUM 1000 + D PO) Take 1 capsule by mouth 2 (two) times daily.    Historical Provider, MD  Coenzyme Q10 (CO Q 10) 100 MG CAPS Take 2 capsules by mouth daily.    Historical Provider, MD  Collagen 500 MG CAPS Take 4 capsules by mouth daily.    Historical Provider, MD  fluocinonide-emollient (LIDEX-E) 0.05 % cream Apply top to affected area up to twice per day as needed for itching.  05/31/11   Wendie Agreste, MD  Glucosamine HCl-MSM 750-750 MG TABS Take 1 tablet by mouth 2 (two) times daily.    Historical Provider, MD  Magnesium 500 MG TABS Take 1 tablet by mouth daily.    Historical Provider, MD  Potassium 99 MG TABS Take 1 tablet by mouth daily.    Historical Provider, MD  pravastatin (PRAVACHOL) 80 MG tablet Take 80 mg by mouth daily.    Historical Provider, MD  Probiotic Product (PROBIOTIC DAILY PO) Take by mouth 2 (two) times daily.    Historical Provider, MD    ROS:  Out of a complete 14 system review of symptoms, the patient complains only of the following symptoms, and all other reviewed systems  are negative.  Fatigue, decreased weight Ringing in the ears, difficulty swallowing, food allergies Chronic insomnia Dizziness, headache    Blood pressure 140/88, pulse 78, height 5\' 3"  (1.6 m), weight 150 lb (68.04 kg).  Physical Exam  General: The patient is alert and cooperative at the time of the examination.  Skin: No significant peripheral edema is noted.   Neurologic Exam  Mental status: The patient is alert and oriented x 3 at the time of the examination. The patient has apparent normal recent and remote memory, with an apparently normal attention span and concentration ability.   Cranial nerves: Facial symmetry is present. Speech is normal, no aphasia or dysarthria is noted. Extraocular movements are full. Visual fields are full.  Motor: The patient has good strength in all 4 extremities.  Sensory examination: Soft touch sensation is decreased on the right face, arm, and leg.  Coordination: The patient has good finger-nose-finger and heel-to-shin bilaterally. The Nyan-Barrany procedure was negative. No nystagmus was seen. The patient reported brief sensations of dizziness.  Gait and station: The patient has a normal gait. Tandem gait is normal. Romberg is negative. No drift is seen.  Reflexes: Deep tendon reflexes are symmetric.   Assessment/Plan:  1. Episodic gait disturbance   2. Sensation of vertigo   3. Chronic insomnia   The patient is having ongoing issues with dizziness, vertigo, and some gait issues. She does not relate the vertigo and the gait problems together. The patient will be given a trial on Antivert to see if this improves her symptoms. She last had MRI evaluation of the brain in 2013. This may be repeated in the future. She will follow-up in 4 months. The insomnia issues may be addressed in the future as well.   Jill Alexanders MD 04/24/2015 7:28 PM  [facility] [address] [place], [place]  [address]  Phone [phone] Fax [phone]

## 2015-04-24 NOTE — Patient Instructions (Signed)

## 2015-05-07 DIAGNOSIS — M25511 Pain in right shoulder: Secondary | ICD-10-CM | POA: Diagnosis not present

## 2015-05-07 DIAGNOSIS — M9901 Segmental and somatic dysfunction of cervical region: Secondary | ICD-10-CM | POA: Diagnosis not present

## 2015-05-07 DIAGNOSIS — M50322 Other cervical disc degeneration at C5-C6 level: Secondary | ICD-10-CM | POA: Diagnosis not present

## 2015-05-07 DIAGNOSIS — M9902 Segmental and somatic dysfunction of thoracic region: Secondary | ICD-10-CM | POA: Diagnosis not present

## 2015-05-12 DIAGNOSIS — M9901 Segmental and somatic dysfunction of cervical region: Secondary | ICD-10-CM | POA: Diagnosis not present

## 2015-05-12 DIAGNOSIS — M9902 Segmental and somatic dysfunction of thoracic region: Secondary | ICD-10-CM | POA: Diagnosis not present

## 2015-05-12 DIAGNOSIS — M50322 Other cervical disc degeneration at C5-C6 level: Secondary | ICD-10-CM | POA: Diagnosis not present

## 2015-05-12 DIAGNOSIS — M25511 Pain in right shoulder: Secondary | ICD-10-CM | POA: Diagnosis not present

## 2015-05-19 DIAGNOSIS — M50322 Other cervical disc degeneration at C5-C6 level: Secondary | ICD-10-CM | POA: Diagnosis not present

## 2015-05-19 DIAGNOSIS — M9902 Segmental and somatic dysfunction of thoracic region: Secondary | ICD-10-CM | POA: Diagnosis not present

## 2015-05-19 DIAGNOSIS — M9901 Segmental and somatic dysfunction of cervical region: Secondary | ICD-10-CM | POA: Diagnosis not present

## 2015-05-19 DIAGNOSIS — M25511 Pain in right shoulder: Secondary | ICD-10-CM | POA: Diagnosis not present

## 2015-05-20 DIAGNOSIS — M9902 Segmental and somatic dysfunction of thoracic region: Secondary | ICD-10-CM | POA: Diagnosis not present

## 2015-05-20 DIAGNOSIS — M25511 Pain in right shoulder: Secondary | ICD-10-CM | POA: Diagnosis not present

## 2015-05-20 DIAGNOSIS — M9901 Segmental and somatic dysfunction of cervical region: Secondary | ICD-10-CM | POA: Diagnosis not present

## 2015-05-20 DIAGNOSIS — M50322 Other cervical disc degeneration at C5-C6 level: Secondary | ICD-10-CM | POA: Diagnosis not present

## 2015-05-27 DIAGNOSIS — R630 Anorexia: Secondary | ICD-10-CM | POA: Diagnosis not present

## 2015-05-27 DIAGNOSIS — Z86010 Personal history of colonic polyps: Secondary | ICD-10-CM | POA: Diagnosis not present

## 2015-07-07 DIAGNOSIS — M9901 Segmental and somatic dysfunction of cervical region: Secondary | ICD-10-CM | POA: Diagnosis not present

## 2015-07-07 DIAGNOSIS — M9902 Segmental and somatic dysfunction of thoracic region: Secondary | ICD-10-CM | POA: Diagnosis not present

## 2015-07-07 DIAGNOSIS — M25511 Pain in right shoulder: Secondary | ICD-10-CM | POA: Diagnosis not present

## 2015-07-07 DIAGNOSIS — M50322 Other cervical disc degeneration at C5-C6 level: Secondary | ICD-10-CM | POA: Diagnosis not present

## 2015-07-31 DIAGNOSIS — M542 Cervicalgia: Secondary | ICD-10-CM | POA: Diagnosis not present

## 2015-07-31 DIAGNOSIS — M50322 Other cervical disc degeneration at C5-C6 level: Secondary | ICD-10-CM | POA: Diagnosis not present

## 2015-07-31 DIAGNOSIS — M9902 Segmental and somatic dysfunction of thoracic region: Secondary | ICD-10-CM | POA: Diagnosis not present

## 2015-07-31 DIAGNOSIS — M9901 Segmental and somatic dysfunction of cervical region: Secondary | ICD-10-CM | POA: Diagnosis not present

## 2015-08-05 DIAGNOSIS — M9901 Segmental and somatic dysfunction of cervical region: Secondary | ICD-10-CM | POA: Diagnosis not present

## 2015-08-05 DIAGNOSIS — M9902 Segmental and somatic dysfunction of thoracic region: Secondary | ICD-10-CM | POA: Diagnosis not present

## 2015-08-05 DIAGNOSIS — M50322 Other cervical disc degeneration at C5-C6 level: Secondary | ICD-10-CM | POA: Diagnosis not present

## 2015-08-05 DIAGNOSIS — M542 Cervicalgia: Secondary | ICD-10-CM | POA: Diagnosis not present

## 2015-08-12 ENCOUNTER — Ambulatory Visit (INDEPENDENT_AMBULATORY_CARE_PROVIDER_SITE_OTHER): Payer: Medicare Other | Admitting: Sports Medicine

## 2015-08-12 ENCOUNTER — Encounter: Payer: Self-pay | Admitting: Sports Medicine

## 2015-08-12 ENCOUNTER — Ambulatory Visit (INDEPENDENT_AMBULATORY_CARE_PROVIDER_SITE_OTHER): Payer: Medicare Other

## 2015-08-12 DIAGNOSIS — M79672 Pain in left foot: Secondary | ICD-10-CM

## 2015-08-12 DIAGNOSIS — S90822A Blister (nonthermal), left foot, initial encounter: Secondary | ICD-10-CM

## 2015-08-12 DIAGNOSIS — L089 Local infection of the skin and subcutaneous tissue, unspecified: Secondary | ICD-10-CM | POA: Diagnosis not present

## 2015-08-12 MED ORDER — AMOXICILLIN-POT CLAVULANATE 875-125 MG PO TABS: 1.0000 | ORAL_TABLET | Freq: Two times a day (BID) | ORAL | Status: DC

## 2015-08-12 NOTE — Progress Notes (Signed)
Patient ID: Jenny Oliver, female   DOB: Apr 25, 1945, 70 y.o.   MRN: [id number] Subjective: Jenny Oliver is a 71 y.o. female patient seen in office for evaluation of blister to left foot. Patient denies a history of diabetes. Reports that she stepped on a pop corn seed 2 weeks ago and developed a blister, since she cleansed with alcohol and applied neosporin and band-aid but hasn't gotten better. Admits swelling to left foot. Denies nausea/fever/vomiting/chills/night sweats/shortness of breath/pain. Patient has no other pedal complaints at this time.  Patient Active Problem List   Diagnosis Date Noted  . Benign paroxysmal positional vertigo 04/24/2015  . Chronic insomnia 04/24/2015  . Gait disorder 11/06/2014  . Tobacco user 09/08/2013  . Allergic rhinitis 09/08/2013  . Lipid disorder 09/08/2013  . Dyspepsia and disorder of function of stomach 09/08/2013  . Caregiver stress 09/08/2013   Current Outpatient Prescriptions on File Prior to Visit  Medication Sig Dispense Refill  . acetaminophen (TYLENOL) 650 MG CR tablet Take 1,300 mg by mouth daily.     Marland Kitchen aMILoride (MIDAMOR) 5 MG tablet Take 5 mg by mouth daily.    Marland Kitchen ascorbic acid (VITAMIN C) 1000 MG tablet Take 1,000 mg by mouth 2 (two) times daily.    . Calcium Carb-Cholecalciferol (CALCIUM 1000 + D PO) Take 1 capsule by mouth 2 (two) times daily.    . Cholecalciferol (VITAMIN D3) 2000 units TABS Take 1 tablet by mouth daily.    . Coenzyme Q10 (CO Q 10) 100 MG CAPS Take 2 capsules by mouth daily.    . Collagen 500 MG CAPS Take 2 capsules by mouth daily.     . fluocinonide-emollient (LIDEX-E) 0.05 % cream Apply top to affected area up to twice per day as needed for itching. 30 g 0  . Glucosamine HCl-MSM 750-750 MG TABS Take 1 tablet by mouth 2 (two) times daily.    . Magnesium 500 MG TABS Take 1 tablet by mouth daily.    . meclizine (ANTIVERT) 25 MG tablet Take 1 tablet (25 mg total) by mouth 3 (three) times daily. 90 tablet 1  . Potassium 99  MG TABS Take 2 tablets by mouth daily.     . pravastatin (PRAVACHOL) 80 MG tablet Take 80 mg by mouth daily.    . Probiotic Product (PROBIOTIC DAILY PO) Take by mouth 2 (two) times daily.     No current facility-administered medications on file prior to visit.   Allergies  Allergen Reactions  . Crestor [Rosuvastatin]   . Lipitor [Atorvastatin]   . Milk-Related Compounds   . Other     Bread, peanuts/tree nuts  . Zetia [Ezetimibe]     No results found for this or any previous visit (from the past 2160 hour(s)).  Objective: There were no vitals filed for this visit.  General: Patient is awake, alert, oriented x 3 and in no acute distress.  Dermatology: Skin is warm and dry bilateral with a blister at the plantar aspect of the left foot with puss, once de-roofed revealed a ulcerated base that granular measuring 3x1cm.The ulceration does not probe to bone. No foreign body. There is no malodor, no erythema,  mild edema. No other acute signs of infection.   Vascular: Dorsalis Pedis pulse = 2/4 Bilateral,  Posterior Tibial pulse = 1/4 Bilateral,  Capillary Fill Time < 5 seconds  Neurologic: Gross sensation present via light touch bilateral.  Musculosketal: No Pain with palpation to ulcerated area left foot. No pain with compression to  calves bilateral. No symptomatic bony deformities noted bilateral.  Xrays, Left foot:Normal osseous mineralization. Diffuse changes suggestive of osteoarthritis. Mild inferior calcaneal spur, No bony destruction suggestive of osteomyelitis. No gas in soft tissues. No foreign body. Mild soft tissue swelling.  Assessment and Plan:  Problem List Items Addressed This Visit    None    Visit Diagnoses    Left foot pain    -  Primary    Relevant Medications    amoxicillin-clavulanate (AUGMENTIN) 875-125 MG tablet    Other Relevant Orders    DG Foot 2 Views Left    Blister of foot with infection, left, initial encounter        Relevant Medications     amoxicillin-clavulanate (AUGMENTIN) 875-125 MG tablet    Other Relevant Orders    Wound culture      -Examined patient and discussed the progression of the blister/wound and treatment alternatives. -Xrays reviewed -Incision and drainage of blister using a sterile tissue nipper was performed and removed nonviable blistered skin. Wound culture was obtained from site; will call patient with results if change in antibiotics are warranted  -Applied Iodosorb and dry sterile dressing and instructed patient to continue with daily dressings at home consisting of betadine and bandaid/dry sterile dressing. Advised Epsom salt soaks daily before dressing area. -Rx Augmentin for preventive measures for concerning infection - Advised patient to go to the ER or return to office if the wound worsens or if constitutional symptoms are present. -Recommend good supportive shoes; may consider post op shoe next visit if wound persists  -Patient to return to office in 2 weeks for follow up care and evaluation or sooner if problems arise.  Landis Martins, DPM

## 2015-08-12 NOTE — Patient Instructions (Signed)
Epsom salt soaks once daily for 15 mins Dress with betadine and guaze daily until seen in 2 weeks

## 2015-08-18 ENCOUNTER — Telehealth: Payer: Self-pay | Admitting: *Deleted

## 2015-08-18 NOTE — Telephone Encounter (Signed)
Pt states her foot has sharp pain, and now hurts so bad she can't eat, and something she is eating is causing this.  I spoke with pt, she states the injury area is better, no redness, no drainage, the painful area is below the ball of her foot in the center, there is a sharp pain and swelling, and she's been using ice, which helped, but would like to be seen.  I told pt to continue the ice, resting and a stiff bottom shoe and transferred to schedulers.

## 2015-08-26 ENCOUNTER — Encounter: Payer: Self-pay | Admitting: Sports Medicine

## 2015-08-26 ENCOUNTER — Ambulatory Visit (INDEPENDENT_AMBULATORY_CARE_PROVIDER_SITE_OTHER): Payer: Medicare Other | Admitting: Sports Medicine

## 2015-08-26 VITALS — BP 134/82 | HR 76 | Resp 12

## 2015-08-26 DIAGNOSIS — L89891 Pressure ulcer of other site, stage 1: Secondary | ICD-10-CM | POA: Diagnosis not present

## 2015-08-26 DIAGNOSIS — M79672 Pain in left foot: Secondary | ICD-10-CM

## 2015-08-26 DIAGNOSIS — S90822D Blister (nonthermal), left foot, subsequent encounter: Secondary | ICD-10-CM

## 2015-08-26 DIAGNOSIS — L089 Local infection of the skin and subcutaneous tissue, unspecified: Secondary | ICD-10-CM

## 2015-08-26 NOTE — Progress Notes (Signed)
Patient ID: Jenny Oliver, female   DOB: 1945-05-13, 70 y.o.   MRN: [id number]   Subjective: Jenny Oliver is a 70 y.o. female patient seen in office for follow up evaluation of blister to left foot. Admits to 1 episode of pain and states that she is to follow up with diabetic doctor to check for diabetics. Denies nausea/fever/vomiting/chills/night sweats/shortness of breath/pain. Patient has no other pedal complaints at this time.  Patient Active Problem List   Diagnosis Date Noted  . Benign paroxysmal positional vertigo 04/24/2015  . Chronic insomnia 04/24/2015  . Gait disorder 11/06/2014  . Tobacco user 09/08/2013  . Allergic rhinitis 09/08/2013  . Lipid disorder 09/08/2013  . Dyspepsia and disorder of function of stomach 09/08/2013  . Caregiver stress 09/08/2013   Current Outpatient Prescriptions on File Prior to Visit  Medication Sig Dispense Refill  . acetaminophen (TYLENOL) 650 MG CR tablet Take 1,300 mg by mouth daily.     Marland Kitchen aMILoride (MIDAMOR) 5 MG tablet Take 5 mg by mouth daily.    Marland Kitchen amoxicillin-clavulanate (AUGMENTIN) 875-125 MG tablet Take 1 tablet by mouth 2 (two) times daily. 20 tablet 0  . ascorbic acid (VITAMIN C) 1000 MG tablet Take 1,000 mg by mouth 2 (two) times daily.    . Calcium Carb-Cholecalciferol (CALCIUM 1000 + D PO) Take 1 capsule by mouth 2 (two) times daily.    . Cholecalciferol (VITAMIN D3) 2000 units TABS Take 1 tablet by mouth daily.    . Coenzyme Q10 (CO Q 10) 100 MG CAPS Take 2 capsules by mouth daily.    . Collagen 500 MG CAPS Take 2 capsules by mouth daily.     . fluocinonide-emollient (LIDEX-E) 0.05 % cream Apply top to affected area up to twice per day as needed for itching. 30 g 0  . Glucosamine HCl-MSM 750-750 MG TABS Take 1 tablet by mouth 2 (two) times daily.    . Magnesium 500 MG TABS Take 1 tablet by mouth daily.    . meclizine (ANTIVERT) 25 MG tablet Take 1 tablet (25 mg total) by mouth 3 (three) times daily. 90 tablet 1  . Potassium 99 MG  TABS Take 2 tablets by mouth daily.     . pravastatin (PRAVACHOL) 80 MG tablet Take 80 mg by mouth daily.    . Probiotic Product (PROBIOTIC DAILY PO) Take by mouth 2 (two) times daily.     No current facility-administered medications on file prior to visit.   Allergies  Allergen Reactions  . Crestor [Rosuvastatin]   . Lipitor [Atorvastatin]   . Milk-Related Compounds   . Other     Bread, peanuts/tree nuts  . Zetia [Ezetimibe]     No results found for this or any previous visit (from the past 2160 hour(s)).  Objective: There were no vitals filed for this visit.  General: Patient is awake, alert, oriented x 3 and in no acute distress.  Dermatology: Skin is warm and dry bilateral with a dry healing blister at the plantar aspect of the left foot with no signs of infection. There is no malodor, no erythema,  decreased edema. No other acute signs of infection.   Vascular: Dorsalis Pedis pulse = 2/4 Bilateral,  Posterior Tibial pulse = 1/4 Bilateral,  Capillary Fill Time < 5 seconds  Neurologic: Gross sensation present via light touch bilateral.  Musculosketal: No Pain with palpation to ulcerated area left foot. No pain with compression to calves bilateral. No symptomatic bony deformities noted bilateral.  Assessment and  Plan:  Problem List Items Addressed This Visit    None    Visit Diagnoses    Blister of foot with infection, left, subsequent encounter    -  Primary    infection resolved    Left foot pain          -Examined patient and discussed the progression of the blister/wound and treatment alternatives. -Debrided dry blistered skin at left plantar foot. No opening area healing well.  -Augmentin completed -Advised betadine for 1 more week and then to use skin creams and moisturizers to area to prevent callusing  - Advised patient to go to the ER or return to office if the area worsens or if constitutional symptoms are present. -Recommend good supportive shoes  daily -Patient to return to office in 5 weeks for follow up care and evaluation or sooner if problems arise.  Landis Martins, DPM

## 2015-08-27 ENCOUNTER — Ambulatory Visit (INDEPENDENT_AMBULATORY_CARE_PROVIDER_SITE_OTHER): Payer: Medicare Other | Admitting: Neurology

## 2015-08-27 ENCOUNTER — Encounter: Payer: Self-pay | Admitting: Neurology

## 2015-08-27 VITALS — BP 132/78 | HR 86 | Ht 63.0 in | Wt 141.0 lb

## 2015-08-27 DIAGNOSIS — R269 Unspecified abnormalities of gait and mobility: Secondary | ICD-10-CM | POA: Diagnosis not present

## 2015-08-27 DIAGNOSIS — G441 Vascular headache, not elsewhere classified: Secondary | ICD-10-CM

## 2015-08-27 DIAGNOSIS — R519 Headache, unspecified: Secondary | ICD-10-CM

## 2015-08-27 DIAGNOSIS — R51 Headache: Secondary | ICD-10-CM

## 2015-08-27 DIAGNOSIS — H8110 Benign paroxysmal vertigo, unspecified ear: Secondary | ICD-10-CM | POA: Diagnosis not present

## 2015-08-27 HISTORY — DX: Headache, unspecified: R51.9

## 2015-08-27 MED ORDER — TOPIRAMATE 25 MG PO TABS: ORAL_TABLET | ORAL | Status: DC

## 2015-08-27 NOTE — Patient Instructions (Signed)
   Topamax (topiramate) is a seizure medication that has an FDA approval for seizures and for migraine headache. Potential side effects of this medication include weight loss, cognitive slowing, tingling in the fingers and toes, and carbonated drinks will taste bad. If any significant side effects are noted on this drug, please contact our office.  

## 2015-08-27 NOTE — Progress Notes (Signed)
Reason for visit: Headache, vertigo  Jenny Oliver is an 70 y.o. female  History of present illness:  Jenny Oliver is a 70 year old right-handed black female with a history of gait instability and episodic vertigo. The patient has had chronic daily headaches that are all around the head, and behind the eyes. The headache may be better some days than others. She does not clearly correlate the episodes of dizziness with the headache. She continues to have a sensation of imbalance, she has not had any falls since last seen. She will stumble on occasion, she does not feel that she is picking up her feet well enough. Physical therapy has been done previously, it is not clear that the patient has gained any long-lasting benefit with this. She has had a vestibular assessment with PT, but no nystagmus was seen. The patient feels true vertigo at times. She denies any significant neck pain or neck stiffness, she does have some pressure and ringing in the ears. She returns to this office for an evaluation. Last MRI of the brain was done was in 2013. This was done because of headache. The patient has had a cervical MRI that did not show cord compression, she does have spondylosis with neuroforaminal stenosis.  Past Medical History  Diagnosis Date  . GERD (gastroesophageal reflux disease)   . Chronic kidney disease   . 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 COPD (chronic obstructive pulmonary disease) ([place])   . Vitamin D deficiency   . Vitamin B12 deficiency   . OA (osteoarthritis)   . Sciatica   . Gait disorder 11/06/2014  . Benign paroxysmal positional vertigo 04/24/2015  . Chronic insomnia 04/24/2015  . Headache 08/27/2015    Past Surgical History  Procedure Laterality Date  . Balloon dilation  11/03/2011    Procedure: BALLOON DILATION;  Surgeon: Arta Silence, MD;  Location: [facility];  Service: Endoscopy;  Laterality: N/A;  . Partial hysterectomy  1994  . Tonsillectomy      Family History  Problem  Relation Age of Onset  . Heart disease Mother   . Hyperlipidemia Mother   . Cancer Father     stomach  . Healthy Brother     Social history:  reports that she has been smoking.  She has never used smokeless tobacco. She reports that she does not drink alcohol or use illicit drugs.    Allergies  Allergen Reactions  . Crestor [Rosuvastatin]   . Lipitor [Atorvastatin]   . Milk-Related Compounds   . Other     Bread, peanuts/tree nuts  . Zetia [Ezetimibe]     Medications:  Prior to Admission medications   Medication Sig Start Date End Date Taking? Authorizing Provider  acetaminophen (TYLENOL) 650 MG CR tablet Take 1,300 mg by mouth daily.    Yes Historical Provider, MD  aMILoride (MIDAMOR) 5 MG tablet Take 5 mg by mouth daily.   Yes Historical Provider, MD  amoxicillin-clavulanate (AUGMENTIN) 875-125 MG tablet Take 1 tablet by mouth 2 (two) times daily. 08/12/15  Yes Landis Martins, DPM  ascorbic acid (VITAMIN C) 1000 MG tablet Take 1,000 mg by mouth 2 (two) times daily.   Yes Historical Provider, MD  Calcium Carb-Cholecalciferol (CALCIUM 1000 + D PO) Take 1 capsule by mouth 2 (two) times daily.   Yes Historical Provider, MD  Cholecalciferol (VITAMIN D3) 2000 units TABS Take 1 tablet by mouth daily.   Yes Historical Provider, MD  Coenzyme Q10 (CO Q 10) 100 MG CAPS Take 2  capsules by mouth daily.   Yes Historical Provider, MD  Collagen 500 MG CAPS Take 2 capsules by mouth daily.    Yes Historical Provider, MD  fluocinonide-emollient (LIDEX-E) 0.05 % cream Apply top to affected area up to twice per day as needed for itching. 05/31/11  Yes Wendie Agreste, MD  Glucosamine HCl-MSM 750-750 MG TABS Take 1 tablet by mouth 2 (two) times daily.   Yes Historical Provider, MD  Magnesium 500 MG TABS Take 1 tablet by mouth daily.   Yes Historical Provider, MD  Potassium 99 MG TABS Take 2 tablets by mouth daily.    Yes Historical Provider, MD  pravastatin (PRAVACHOL) 80 MG tablet Take 80 mg by mouth  daily.   Yes Historical Provider, MD  Probiotic Product (PROBIOTIC DAILY PO) Take by mouth 2 (two) times daily.   Yes Historical Provider, MD  topiramate (TOPAMAX) 25 MG tablet Take one tablet at night for one week, then take 2 tablets at night 08/27/15   Kathrynn Ducking, MD    ROS:  Out of a complete 14 system review of symptoms, the patient complains only of the following symptoms, and all other reviewed systems are negative.  Decreased appetite, fatigue Ringing in the ears, difficulty swallowing Cough, chest tightness Insomnia Food allergies Back pain, achy muscles, walking difficulty Memory loss, dizziness, headache  Blood pressure 132/78, pulse 86, height 5\' 3"  (1.6 m), weight 141 lb (63.957 kg).  Physical Exam  General: The patient is alert and cooperative at the time of the examination.  Skin: No significant peripheral edema is noted.   Neurologic Exam  Mental status: The patient is alert and oriented x 3 at the time of the examination. The patient has apparent normal recent and remote memory, with an apparently normal attention span and concentration ability.   Cranial nerves: Facial symmetry is present. Speech is normal, no aphasia or dysarthria is noted. Extraocular movements are full. Visual fields are full.  Motor: The patient has good strength in all 4 extremities.  Sensory examination: Soft touch sensation is symmetric on the face, arms, and legs.  Coordination: The patient has good finger-nose-finger and heel-to-shin bilaterally.  Gait and station: The patient has a normal gait. Tandem gait is normal. Romberg is negative. No drift is seen.  Reflexes: Deep tendon reflexes are symmetric.    MRI cervical 11/18/14:  IMPRESSION: This is an abnormal MRI of the cervical spine show the following: 1. At C3-C4, there is right foraminal narrowing. There is no definite nerve root compression though there is some encroachment upon the exiting right C4 nerve root 2.  At C4-C5, there is broad disc protrusion and mild uncovertebral spurring. Both neural foramina are mildly narrowed but there does not appear to be any nerve root impingement. 3. At C5-C6, which is ridging and minimal uncovertebral spurring there is mild bilateral foraminal narrowing but there does not appear to be any nerve root impingement. 4. The spinal cord appears normal.   Assessment/Plan:  1. Gait instability  2. Episodic vertigo  3. Headache, chronic and daily  The patient will be given a trial on Topamax for the headache and hopefully to help the vertigo as well. The patient will be set up for MRI of the brain. She will follow-up in 4 months. If the patient requires a dose adjustment on the medication, she is to contact our office.  Jill Alexanders MD 08/27/2015 7:09 PM  [facility] [address] [place], [place] [address]  Phone [phone] Fax [phone]  

## 2015-09-02 DIAGNOSIS — I10 Essential (primary) hypertension: Secondary | ICD-10-CM | POA: Diagnosis not present

## 2015-09-02 DIAGNOSIS — R7301 Impaired fasting glucose: Secondary | ICD-10-CM | POA: Diagnosis not present

## 2015-09-02 DIAGNOSIS — E559 Vitamin D deficiency, unspecified: Secondary | ICD-10-CM | POA: Diagnosis not present

## 2015-09-08 DIAGNOSIS — R7309 Other abnormal glucose: Secondary | ICD-10-CM | POA: Diagnosis not present

## 2015-09-08 DIAGNOSIS — R7301 Impaired fasting glucose: Secondary | ICD-10-CM | POA: Diagnosis not present

## 2015-09-08 DIAGNOSIS — F17210 Nicotine dependence, cigarettes, uncomplicated: Secondary | ICD-10-CM | POA: Diagnosis not present

## 2015-09-08 DIAGNOSIS — E782 Mixed hyperlipidemia: Secondary | ICD-10-CM | POA: Diagnosis not present

## 2015-09-08 DIAGNOSIS — R7302 Impaired glucose tolerance (oral): Secondary | ICD-10-CM | POA: Diagnosis not present

## 2015-09-08 DIAGNOSIS — I10 Essential (primary) hypertension: Secondary | ICD-10-CM | POA: Diagnosis not present

## 2015-09-08 DIAGNOSIS — M8589 Other specified disorders of bone density and structure, multiple sites: Secondary | ICD-10-CM | POA: Diagnosis not present

## 2015-09-08 DIAGNOSIS — E2609 Other primary hyperaldosteronism: Secondary | ICD-10-CM | POA: Diagnosis not present

## 2015-09-09 DIAGNOSIS — M9901 Segmental and somatic dysfunction of cervical region: Secondary | ICD-10-CM | POA: Diagnosis not present

## 2015-09-09 DIAGNOSIS — M9902 Segmental and somatic dysfunction of thoracic region: Secondary | ICD-10-CM | POA: Diagnosis not present

## 2015-09-09 DIAGNOSIS — Z0100 Encounter for examination of eyes and vision without abnormal findings: Secondary | ICD-10-CM | POA: Diagnosis not present

## 2015-09-09 DIAGNOSIS — H25013 Cortical age-related cataract, bilateral: Secondary | ICD-10-CM | POA: Diagnosis not present

## 2015-09-09 DIAGNOSIS — H2513 Age-related nuclear cataract, bilateral: Secondary | ICD-10-CM | POA: Diagnosis not present

## 2015-09-09 DIAGNOSIS — M542 Cervicalgia: Secondary | ICD-10-CM | POA: Diagnosis not present

## 2015-09-09 DIAGNOSIS — M50322 Other cervical disc degeneration at C5-C6 level: Secondary | ICD-10-CM | POA: Diagnosis not present

## 2015-09-12 DIAGNOSIS — M50322 Other cervical disc degeneration at C5-C6 level: Secondary | ICD-10-CM | POA: Diagnosis not present

## 2015-09-12 DIAGNOSIS — M9901 Segmental and somatic dysfunction of cervical region: Secondary | ICD-10-CM | POA: Diagnosis not present

## 2015-09-12 DIAGNOSIS — M542 Cervicalgia: Secondary | ICD-10-CM | POA: Diagnosis not present

## 2015-09-12 DIAGNOSIS — M9902 Segmental and somatic dysfunction of thoracic region: Secondary | ICD-10-CM | POA: Diagnosis not present

## 2015-09-16 ENCOUNTER — Ambulatory Visit
Admission: RE | Admit: 2015-09-16 | Discharge: 2015-09-16 | Disposition: A | Discharge status: Home / Self Care | Payer: Medicare Other | Source: Ambulatory Visit | Attending: Neurology | Admitting: Neurology

## 2015-09-16 DIAGNOSIS — G441 Vascular headache, not elsewhere classified: Secondary | ICD-10-CM

## 2015-09-16 DIAGNOSIS — H8110 Benign paroxysmal vertigo, unspecified ear: Secondary | ICD-10-CM

## 2015-09-16 DIAGNOSIS — R269 Unspecified abnormalities of gait and mobility: Secondary | ICD-10-CM

## 2015-09-16 DIAGNOSIS — R51 Headache: Secondary | ICD-10-CM | POA: Diagnosis not present

## 2015-09-19 ENCOUNTER — Telehealth: Payer: Self-pay | Admitting: Neurology

## 2015-09-19 NOTE — Telephone Encounter (Signed)
I called patient. MRI the brain shows no change from August 2013. I discussed this with patient. We will continue to follow this issue over time.   MRI brain 09/18/15:  IMPRESSION:  Abnormal MRI brain (without) demonstrating: 1. Extra-axial, mass lesion along the lateral margin of left cavernous carotid artery (measures 6x58mm on axial views). This appears stable compared to prior imaging studies. Likely represents stable meningioma.  2. Mild scattered round periventricular and subcortical foci of chronic small vessel ischemic disease.  3. No acute findings. 4. No change from MRI on 11/17/11.

## 2015-09-29 ENCOUNTER — Ambulatory Visit: Payer: Medicare Other | Admitting: Sports Medicine

## 2015-10-07 ENCOUNTER — Ambulatory Visit (INDEPENDENT_AMBULATORY_CARE_PROVIDER_SITE_OTHER): Payer: Medicare Other | Admitting: Sports Medicine

## 2015-10-07 ENCOUNTER — Encounter: Payer: Self-pay | Admitting: Sports Medicine

## 2015-10-07 ENCOUNTER — Telehealth: Payer: Self-pay | Admitting: *Deleted

## 2015-10-07 DIAGNOSIS — M542 Cervicalgia: Secondary | ICD-10-CM | POA: Diagnosis not present

## 2015-10-07 DIAGNOSIS — S90822D Blister (nonthermal), left foot, subsequent encounter: Secondary | ICD-10-CM

## 2015-10-07 DIAGNOSIS — L089 Local infection of the skin and subcutaneous tissue, unspecified: Secondary | ICD-10-CM

## 2015-10-07 DIAGNOSIS — M9901 Segmental and somatic dysfunction of cervical region: Secondary | ICD-10-CM | POA: Diagnosis not present

## 2015-10-07 DIAGNOSIS — M79672 Pain in left foot: Secondary | ICD-10-CM

## 2015-10-07 DIAGNOSIS — M50322 Other cervical disc degeneration at C5-C6 level: Secondary | ICD-10-CM | POA: Diagnosis not present

## 2015-10-07 DIAGNOSIS — Q828 Other specified congenital malformations of skin: Secondary | ICD-10-CM | POA: Diagnosis not present

## 2015-10-07 DIAGNOSIS — M9902 Segmental and somatic dysfunction of thoracic region: Secondary | ICD-10-CM | POA: Diagnosis not present

## 2015-10-07 MED ORDER — NONFORMULARY OR COMPOUNDED ITEM: Status: DC

## 2015-10-07 NOTE — Progress Notes (Signed)
Patient ID: Jenny Oliver, female   DOB: [birth year], 70 y.o.   MRN: [id number]  Subjective: Jenny Oliver is a 70 y.o. female patient seen in office for follow up evaluation of blister to left foot. Admits to nightly episodes of pain in L>R foot that is shooting in nature, states that she saw her diabetic doctor to check for diabetes and states that she is still considered "borderline".  Also had gout test which are negative. Admits to history of sciatica. Denies nausea/fever/vomiting/chills/night sweats/shortness of breath/other pains. Patient has no other pedal complaints at this time.  Patient Active Problem List   Diagnosis Date Noted  . Headache 08/27/2015  . Benign paroxysmal positional vertigo 04/24/2015  . Chronic insomnia 04/24/2015  . Gait disorder 11/06/2014  . Tobacco user 09/08/2013  . Allergic rhinitis 09/08/2013  . Lipid disorder 09/08/2013  . Dyspepsia and disorder of function of stomach 09/08/2013  . Caregiver stress 09/08/2013   Current Outpatient Prescriptions on File Prior to Visit  Medication Sig Dispense Refill  . acetaminophen (TYLENOL) 650 MG CR tablet Take 1,300 mg by mouth daily.     Marland Kitchen aMILoride (MIDAMOR) 5 MG tablet Take 5 mg by mouth daily.    Marland Kitchen amoxicillin-clavulanate (AUGMENTIN) 875-125 MG tablet Take 1 tablet by mouth 2 (two) times daily. 20 tablet 0  . ascorbic acid (VITAMIN C) 1000 MG tablet Take 1,000 mg by mouth 2 (two) times daily.    . Calcium Carb-Cholecalciferol (CALCIUM 1000 + D PO) Take 1 capsule by mouth 2 (two) times daily.    . Cholecalciferol (VITAMIN D3) 2000 units TABS Take 1 tablet by mouth daily.    . Coenzyme Q10 (CO Q 10) 100 MG CAPS Take 2 capsules by mouth daily.    . Collagen 500 MG CAPS Take 2 capsules by mouth daily.     . fluocinonide-emollient (LIDEX-E) 0.05 % cream Apply top to affected area up to twice per day as needed for itching. 30 g 0  . Glucosamine HCl-MSM 750-750 MG TABS Take 1 tablet by mouth 2 (two) times daily.    .  Magnesium 500 MG TABS Take 1 tablet by mouth daily.    . Potassium 99 MG TABS Take 2 tablets by mouth daily.     . pravastatin (PRAVACHOL) 80 MG tablet Take 80 mg by mouth daily.    . Probiotic Product (PROBIOTIC DAILY PO) Take by mouth 2 (two) times daily.    Marland Kitchen topiramate (TOPAMAX) 25 MG tablet Take one tablet at night for one week, then take 2 tablets at night 60 tablet 3   No current facility-administered medications on file prior to visit.   Allergies  Allergen Reactions  . Crestor [Rosuvastatin]   . Lipitor [Atorvastatin]   . Milk-Related Compounds   . Other     Bread, peanuts/tree nuts  . Zetia [Ezetimibe]     No results found for this or any previous visit (from the past 2160 hour(s)).  Objective: There were no vitals filed for this visit.  General: Patient is awake, alert, oriented x 3 and in no acute distress.  Dermatology: Skin is warm and dry bilateral with a healed blister at the plantar aspect of the left foot with no signs of infection. There is no malodor, no erythema,  decreased edema. No other acute signs of infection. There is callus sub met 5 with a nucleated core suggestive of porokeratosis left foot.    Vascular: Dorsalis Pedis pulse = 2/4 Bilateral,  Posterior Tibial pulse =  1/4 Bilateral,  Capillary Fill Time < 5 seconds  Neurologic: Gross sensation present via light touch bilateral. Subjective shooting pain L>R foot.   Musculosketal: No Pain with palpation to previous blistered area. Mild tenderness to keratosis left foot. No pain with compression to calves bilateral. No symptomatic bony deformities noted bilateral.  Assessment and Plan:  Problem List Items Addressed This Visit    None    Visit Diagnoses    Blister of foot with infection, left, subsequent encounter    -  Primary    Resolved    Porokeratosis        Left foot pain          -Examined patient  -Previous blister is healed -Parred keratoic lesion sub met 5 on left foot using sterile  chisel blade and applied salinocaine and moleskin to area; keep intact 1 day -Recommended to try Gabapentin 336m QHS however patient declined. Rx topical pain cream from [facility] -Recommend good supportive shoes daily -Patient to return to office as needed for follow up care and evaluation or sooner if problems arise.  TLandis Martins DPM

## 2015-10-10 DIAGNOSIS — M542 Cervicalgia: Secondary | ICD-10-CM | POA: Diagnosis not present

## 2015-10-10 DIAGNOSIS — M9901 Segmental and somatic dysfunction of cervical region: Secondary | ICD-10-CM | POA: Diagnosis not present

## 2015-10-10 DIAGNOSIS — M50322 Other cervical disc degeneration at C5-C6 level: Secondary | ICD-10-CM | POA: Diagnosis not present

## 2015-10-10 DIAGNOSIS — M9902 Segmental and somatic dysfunction of thoracic region: Secondary | ICD-10-CM | POA: Diagnosis not present

## 2015-10-20 DIAGNOSIS — M542 Cervicalgia: Secondary | ICD-10-CM | POA: Diagnosis not present

## 2015-10-20 DIAGNOSIS — M50322 Other cervical disc degeneration at C5-C6 level: Secondary | ICD-10-CM | POA: Diagnosis not present

## 2015-10-20 DIAGNOSIS — M9901 Segmental and somatic dysfunction of cervical region: Secondary | ICD-10-CM | POA: Diagnosis not present

## 2015-10-20 DIAGNOSIS — M9902 Segmental and somatic dysfunction of thoracic region: Secondary | ICD-10-CM | POA: Diagnosis not present

## 2015-10-28 NOTE — Telephone Encounter (Signed)
Entered in error

## 2015-11-06 DIAGNOSIS — M9901 Segmental and somatic dysfunction of cervical region: Secondary | ICD-10-CM | POA: Diagnosis not present

## 2015-11-06 DIAGNOSIS — M9902 Segmental and somatic dysfunction of thoracic region: Secondary | ICD-10-CM | POA: Diagnosis not present

## 2015-11-06 DIAGNOSIS — M50322 Other cervical disc degeneration at C5-C6 level: Secondary | ICD-10-CM | POA: Diagnosis not present

## 2015-11-06 DIAGNOSIS — M542 Cervicalgia: Secondary | ICD-10-CM | POA: Diagnosis not present

## 2015-11-07 DIAGNOSIS — M9901 Segmental and somatic dysfunction of cervical region: Secondary | ICD-10-CM | POA: Diagnosis not present

## 2015-11-07 DIAGNOSIS — M9902 Segmental and somatic dysfunction of thoracic region: Secondary | ICD-10-CM | POA: Diagnosis not present

## 2015-11-07 DIAGNOSIS — M50322 Other cervical disc degeneration at C5-C6 level: Secondary | ICD-10-CM | POA: Diagnosis not present

## 2015-11-07 DIAGNOSIS — M542 Cervicalgia: Secondary | ICD-10-CM | POA: Diagnosis not present

## 2015-11-11 ENCOUNTER — Ambulatory Visit: Payer: Medicare Other | Admitting: Sports Medicine

## 2015-12-30 ENCOUNTER — Encounter: Payer: Self-pay | Admitting: Sports Medicine

## 2015-12-30 ENCOUNTER — Ambulatory Visit (INDEPENDENT_AMBULATORY_CARE_PROVIDER_SITE_OTHER): Payer: Medicare Other | Admitting: Sports Medicine

## 2015-12-30 DIAGNOSIS — M79672 Pain in left foot: Secondary | ICD-10-CM

## 2015-12-30 DIAGNOSIS — Q828 Other specified congenital malformations of skin: Secondary | ICD-10-CM

## 2015-12-30 NOTE — Progress Notes (Signed)
Patient ID: Jenny Oliver, female   DOB: 1945-08-06, 70 y.o.   MRN: [id number]  Subjective: Jenny Oliver is a 70 y.o. female patient seen in office for follow up evaluation of keratosis to left foot. Admits to nightly episodes of pain in L>R foot that is shooting in nature, states that topical cream does not help. Patient has no other pedal complaints at this time.  Patient Active Problem List   Diagnosis Date Noted  . Headache 08/27/2015  . Benign paroxysmal positional vertigo 04/24/2015  . Chronic insomnia 04/24/2015  . Gait disorder 11/06/2014  . Tobacco user 09/08/2013  . Allergic rhinitis 09/08/2013  . Lipid disorder 09/08/2013  . Dyspepsia and disorder of function of stomach 09/08/2013  . Caregiver stress 09/08/2013   Current Outpatient Prescriptions on File Prior to Visit  Medication Sig Dispense Refill  . acetaminophen (TYLENOL) 650 MG CR tablet Take 1,300 mg by mouth daily.     Marland Kitchen aMILoride (MIDAMOR) 5 MG tablet Take 5 mg by mouth daily.    Marland Kitchen amoxicillin-clavulanate (AUGMENTIN) 875-125 MG tablet Take 1 tablet by mouth 2 (two) times daily. 20 tablet 0  . ascorbic acid (VITAMIN C) 1000 MG tablet Take 1,000 mg by mouth 2 (two) times daily.    . Calcium Carb-Cholecalciferol (CALCIUM 1000 + D PO) Take 1 capsule by mouth 2 (two) times daily.    . Cholecalciferol (VITAMIN D3) 2000 units TABS Take 1 tablet by mouth daily.    . Coenzyme Q10 (CO Q 10) 100 MG CAPS Take 2 capsules by mouth daily.    . Collagen 500 MG CAPS Take 2 capsules by mouth daily.     . fluocinonide-emollient (LIDEX-E) 0.05 % cream Apply top to affected area up to twice per day as needed for itching. 30 g 0  . Glucosamine HCl-MSM 750-750 MG TABS Take 1 tablet by mouth 2 (two) times daily.    . Magnesium 500 MG TABS Take 1 tablet by mouth daily.    . NONFORMULARY OR COMPOUNDED [place] compound:  Antiinflammatory Cream - Diclofenac 3%, Baclofen 2%, Cyclobenzaprine 2%, Lidocaine 2%, apply 1-2 grams to  affected area 3-4 times daily. 120 each 3  . Potassium 99 MG TABS Take 2 tablets by mouth daily.     . pravastatin (PRAVACHOL) 80 MG tablet Take 80 mg by mouth daily.    . Probiotic Product (PROBIOTIC DAILY PO) Take by mouth 2 (two) times daily.    Marland Kitchen topiramate (TOPAMAX) 25 MG tablet Take one tablet at night for one week, then take 2 tablets at night 60 tablet 3   No current facility-administered medications on file prior to visit.    Allergies  Allergen Reactions  . Crestor [Rosuvastatin]   . Lipitor [Atorvastatin]   . Milk-Related Compounds   . Other     Bread, peanuts/tree nuts  . Zetia [Ezetimibe]     No results found for this or any previous visit (from the past 2160 hour(s)).  Objective: There were no vitals filed for this visit.  General: Patient is awake, alert, oriented x 3 and in no acute distress.  Dermatology: Skin is warm and dry bilateral with no other acute signs of infection. There is callus sub met 5 with a nucleated core suggestive of porokeratosis left foot.    Vascular: Dorsalis Pedis pulse = 2/4 Bilateral,  Posterior Tibial pulse = 1/4 Bilateral,  Capillary Fill Time < 5 seconds  Neurologic: Gross sensation present via light touch bilateral. Subjective shooting pain  L>R foot. History of sciatica and radiculopathy.   Musculosketal: Mild tenderness to keratosis left foot. No pain with compression to calves bilateral. No symptomatic bony deformities noted bilateral. Floppy left 5th toe from previous surgery.  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 Continue with  topical pain cream from [facility] -Recommend good supportive shoes daily -Patient to return to office as needed for follow up care and evaluation or sooner if problems arise.  TLandis Martins DPM

## 2016-01-02 DIAGNOSIS — M9904 Segmental and somatic dysfunction of sacral region: Secondary | ICD-10-CM | POA: Diagnosis not present

## 2016-01-02 DIAGNOSIS — M9903 Segmental and somatic dysfunction of lumbar region: Secondary | ICD-10-CM | POA: Diagnosis not present

## 2016-01-02 DIAGNOSIS — M9901 Segmental and somatic dysfunction of cervical region: Secondary | ICD-10-CM | POA: Diagnosis not present

## 2016-01-02 DIAGNOSIS — M5136 Other intervertebral disc degeneration, lumbar region: Secondary | ICD-10-CM | POA: Diagnosis not present

## 2016-01-07 ENCOUNTER — Encounter: Payer: Self-pay | Admitting: Neurology

## 2016-01-07 ENCOUNTER — Ambulatory Visit (INDEPENDENT_AMBULATORY_CARE_PROVIDER_SITE_OTHER): Payer: Medicare Other | Admitting: Neurology

## 2016-01-07 VITALS — BP 144/80 | HR 68 | Ht 63.0 in | Wt 146.5 lb

## 2016-01-07 DIAGNOSIS — M9904 Segmental and somatic dysfunction of sacral region: Secondary | ICD-10-CM | POA: Diagnosis not present

## 2016-01-07 DIAGNOSIS — G441 Vascular headache, not elsewhere classified: Secondary | ICD-10-CM | POA: Diagnosis not present

## 2016-01-07 DIAGNOSIS — M9903 Segmental and somatic dysfunction of lumbar region: Secondary | ICD-10-CM | POA: Diagnosis not present

## 2016-01-07 DIAGNOSIS — M5136 Other intervertebral disc degeneration, lumbar region: Secondary | ICD-10-CM | POA: Diagnosis not present

## 2016-01-07 DIAGNOSIS — M9901 Segmental and somatic dysfunction of cervical region: Secondary | ICD-10-CM | POA: Diagnosis not present

## 2016-01-07 MED ORDER — GABAPENTIN 100 MG PO CAPS: 100.0000 mg | ORAL_CAPSULE | Freq: Two times a day (BID) | ORAL | 2 refills | Status: DC

## 2016-01-07 NOTE — Progress Notes (Signed)
Reason for visit: Headache  Jenny Oliver is an 70 y.o. female  History of present illness:  Jenny Oliver is a 70 year old right-handed black female with a history of headache. The patient was placed on Topamax but this resulted in too much drowsiness and she had to stop the medication. The patient has been having some problems with low back pain, she has been seen by Dr. Teryl Lucy for chiropractic treatments which have been very helpful. With the treatments of the low back, she has noticed that her headaches have improved. She does at times have neck pain and neck stiffness, occasionally the headaches may come up from the back of the head. The headaches may also be bifrontal in nature. She is now having about 7 or 8 headache days a month. She does have some crepitus in the neck at times. When the headaches improve, she feels that the balance problems also improve. She returns to this office for an evaluation.  Past Medical History:  Diagnosis Date  . Benign paroxysmal positional vertigo 04/24/2015  . Chronic insomnia 04/24/2015  . Chronic kidney disease   . COPD (chronic obstructive pulmonary disease) ([place])   . Gait disorder 11/06/2014  . GERD (gastroesophageal reflux disease)   . Headache 08/27/2015  . 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 OA (osteoarthritis)   . Sciatica   . Vitamin B12 deficiency   . Vitamin D deficiency     Past Surgical History:  Procedure Laterality Date  . BALLOON DILATION  11/03/2011   Procedure: BALLOON DILATION;  Surgeon: Arta Silence, MD;  Location: [facility];  Service: Endoscopy;  Laterality: N/A;  . PARTIAL HYSTERECTOMY  1994  . TONSILLECTOMY      Family History  Problem Relation Age of Onset  . Heart disease Mother   . Hyperlipidemia Mother   . Cancer Father     stomach  . Healthy Brother     Social history:  reports that she has been smoking.  She has been smoking about 1.00 pack per day. She has never used smokeless tobacco. She reports that she does not  drink alcohol or use drugs.    Allergies  Allergen Reactions  . Crestor [Rosuvastatin]   . Lipitor [Atorvastatin]   . Milk-Related Compounds   . Other     Bread, peanuts/tree nuts  . Zetia [Ezetimibe]     Medications:  Prior to Admission medications   Medication Sig Start Date End Date Taking? Authorizing Provider  acetaminophen (TYLENOL) 650 MG CR tablet Take 1,300 mg by mouth daily.     Historical Provider, MD  aMILoride (MIDAMOR) 5 MG tablet Take 5 mg by mouth daily.    Historical Provider, MD  amoxicillin-clavulanate (AUGMENTIN) 875-125 MG tablet Take 1 tablet by mouth 2 (two) times daily. 08/12/15   Landis Martins, DPM  ascorbic acid (VITAMIN C) 1000 MG tablet Take 1,000 mg by mouth 2 (two) times daily.    Historical Provider, MD  Calcium Carb-Cholecalciferol (CALCIUM 1000 + D PO) Take 1 capsule by mouth 2 (two) times daily.    Historical Provider, MD  Cholecalciferol (VITAMIN D3) 2000 units TABS Take 1 tablet by mouth daily.    Historical Provider, MD  Coenzyme Q10 (CO Q 10) 100 MG CAPS Take 2 capsules by mouth daily.    Historical Provider, MD  Collagen 500 MG CAPS Take 2 capsules by mouth daily.     Historical Provider, MD  fluocinonide-emollient (LIDEX-E) 0.05 % cream Apply top to affected area up to  twice per day as needed for itching. 05/31/11   Wendie Agreste, MD  Glucosamine HCl-MSM 750-750 MG TABS Take 1 tablet by mouth 2 (two) times daily.    Historical Provider, MD  Magnesium 500 MG TABS Take 1 tablet by mouth daily.    Historical Provider, MD  NONFORMULARY OR COMPOUNDED [place] compound:  Antiinflammatory Cream - Diclofenac 3%, Baclofen 2%, Cyclobenzaprine 2%, Lidocaine 2%, apply 1-2 grams to affected area 3-4 times daily. 10/07/15   Landis Martins, DPM  Potassium 99 MG TABS Take 2 tablets by mouth daily.     Historical Provider, MD  pravastatin (PRAVACHOL) 80 MG tablet Take 80 mg by mouth daily.    Historical Provider, MD  Probiotic Product (PROBIOTIC  DAILY PO) Take by mouth 2 (two) times daily.    Historical Provider, MD  topiramate (TOPAMAX) 25 MG tablet Take one tablet at night for one week, then take 2 tablets at night 08/27/15   Kathrynn Ducking, MD    ROS:  Out of a complete 14 system review of symptoms, the patient complains only of the following symptoms, and all other reviewed systems are negative.  Decreased appetite, fatigue Ringing in the ears, trouble swallowing Frequent waking Food allergies Low back pain, muscle cramps Headache  Blood pressure (!) 144/80, pulse 68, height 5\' 3"  (1.6 m), weight 146 lb 8 oz (66.5 kg).  Physical Exam  General: The patient is alert and cooperative at the time of the examination.  Neuromuscular: Range of movement the cervical spine is relatively full.  Skin: No significant peripheral edema is noted.   Neurologic Exam  Mental status: The patient is alert and oriented x 3 at the time of the examination. The patient has apparent normal recent and remote memory, with an apparently normal attention span and concentration ability.   Cranial nerves: Facial symmetry is present. Speech is normal, no aphasia or dysarthria is noted. Extraocular movements are full. Visual fields are full.  Motor: The patient has good strength in all 4 extremities.  Sensory examination: Soft touch sensation is symmetric on the face, arms, and legs.  Coordination: The patient has good finger-nose-finger and heel-to-shin bilaterally.  Gait and station: The patient has a normal gait. Tandem gait is normal. Romberg is negative. No drift is seen.  Reflexes: Deep tendon reflexes are symmetric.   MRI brain 09/18/15:  IMPRESSION:  Abnormal MRI brain (without) demonstrating: 1. Extra-axial, mass lesion along the lateral margin of left cavernous carotid artery (measures 6x89mm on axial views). This appears stable compared to prior imaging studies. Likely represents stable meningioma.  2. Mild scattered round  periventricular and subcortical foci of chronic small vessel ischemic disease.  3. No acute findings. 4. No change from MRI on 11/17/11.  * MRI scan images were reviewed online. I agree with the written report.    Assessment/Plan:  1. Headache  The patient occasionally will have some headaches that come up from the neck with some associated neck stiffness. The patient could not tolerate the Topamax, she will be switched to gabapentin in low dose. She will follow-up in 4 months, sooner if needed. She will call if she is having problems with the medication.  Jill Alexanders MD 01/07/2016 7:31 PM  [facility] [address] [place], [place] [address]  Phone [phone] Fax [phone]

## 2016-01-15 DIAGNOSIS — M9901 Segmental and somatic dysfunction of cervical region: Secondary | ICD-10-CM | POA: Diagnosis not present

## 2016-01-15 DIAGNOSIS — M9903 Segmental and somatic dysfunction of lumbar region: Secondary | ICD-10-CM | POA: Diagnosis not present

## 2016-01-15 DIAGNOSIS — M9904 Segmental and somatic dysfunction of sacral region: Secondary | ICD-10-CM | POA: Diagnosis not present

## 2016-01-15 DIAGNOSIS — M5136 Other intervertebral disc degeneration, lumbar region: Secondary | ICD-10-CM | POA: Diagnosis not present

## 2016-01-29 DIAGNOSIS — M5136 Other intervertebral disc degeneration, lumbar region: Secondary | ICD-10-CM | POA: Diagnosis not present

## 2016-01-29 DIAGNOSIS — M9903 Segmental and somatic dysfunction of lumbar region: Secondary | ICD-10-CM | POA: Diagnosis not present

## 2016-01-29 DIAGNOSIS — M9904 Segmental and somatic dysfunction of sacral region: Secondary | ICD-10-CM | POA: Diagnosis not present

## 2016-01-29 DIAGNOSIS — M9905 Segmental and somatic dysfunction of pelvic region: Secondary | ICD-10-CM | POA: Diagnosis not present

## 2016-01-30 DIAGNOSIS — M9903 Segmental and somatic dysfunction of lumbar region: Secondary | ICD-10-CM | POA: Diagnosis not present

## 2016-01-30 DIAGNOSIS — M9904 Segmental and somatic dysfunction of sacral region: Secondary | ICD-10-CM | POA: Diagnosis not present

## 2016-01-30 DIAGNOSIS — M9905 Segmental and somatic dysfunction of pelvic region: Secondary | ICD-10-CM | POA: Diagnosis not present

## 2016-01-30 DIAGNOSIS — M5136 Other intervertebral disc degeneration, lumbar region: Secondary | ICD-10-CM | POA: Diagnosis not present

## 2016-02-02 ENCOUNTER — Telehealth: Payer: Self-pay | Admitting: Neurology

## 2016-02-02 MED ORDER — DULOXETINE HCL 20 MG PO CPEP: 20.0000 mg | ORAL_CAPSULE | Freq: Every day | ORAL | 3 refills | Status: DC

## 2016-02-02 NOTE — Telephone Encounter (Signed)
Patient called to advise gabapentin (NEURONTIN) 100 MG capsule is causing dizziness. Please call [phone].

## 2016-02-02 NOTE — Telephone Encounter (Signed)
I called patient. The patient is not able to tolerate more than 100 mg of gabapentin daily, this is not helping her headache.  I will have her stop the gabapentin, I will start low-dose Cymbalta at 20 mg daily.

## 2016-02-02 NOTE — Telephone Encounter (Signed)
Pt was seen 01/07/16 for headaches. Was prescribed gabapentin 100 mg twice a day but c/o dizziness on medication. She was unable to tolerate Topamax in the past due to drowsiness.

## 2016-02-03 ENCOUNTER — Ambulatory Visit: Payer: Medicare Other | Admitting: Sports Medicine

## 2016-02-06 DIAGNOSIS — M9903 Segmental and somatic dysfunction of lumbar region: Secondary | ICD-10-CM | POA: Diagnosis not present

## 2016-02-06 DIAGNOSIS — M5136 Other intervertebral disc degeneration, lumbar region: Secondary | ICD-10-CM | POA: Diagnosis not present

## 2016-02-06 DIAGNOSIS — M9904 Segmental and somatic dysfunction of sacral region: Secondary | ICD-10-CM | POA: Diagnosis not present

## 2016-02-06 DIAGNOSIS — M9905 Segmental and somatic dysfunction of pelvic region: Secondary | ICD-10-CM | POA: Diagnosis not present

## 2016-02-13 NOTE — Telephone Encounter (Signed)
I called the patient. She is complaining of muscle cramps on Cymbalta, I do not see this listed as a potential side effects, the patient is on pravastatin which can cause muscle cramps. I would recommend that she stop the medication for several days to see if the cramps go away, if they do, restart the medication. If the cramps do not stop, then she knows that this is not the cause of her cramps. If the patient believes that she just cannot tolerate this medication, she is to call.

## 2016-02-13 NOTE — Telephone Encounter (Signed)
Pt called to advise for the past 3 nights she has been having leg cramps. She thinks it could be from DULoxetine (CYMBALTA) 20 MG capsule, she started it on 02/03/16.  She can be reached at [phone]

## 2016-02-24 ENCOUNTER — Ambulatory Visit: Payer: Medicare Other | Admitting: Sports Medicine

## 2016-02-26 DIAGNOSIS — M9905 Segmental and somatic dysfunction of pelvic region: Secondary | ICD-10-CM | POA: Diagnosis not present

## 2016-02-26 DIAGNOSIS — M5136 Other intervertebral disc degeneration, lumbar region: Secondary | ICD-10-CM | POA: Diagnosis not present

## 2016-02-26 DIAGNOSIS — M9903 Segmental and somatic dysfunction of lumbar region: Secondary | ICD-10-CM | POA: Diagnosis not present

## 2016-02-26 DIAGNOSIS — M9904 Segmental and somatic dysfunction of sacral region: Secondary | ICD-10-CM | POA: Diagnosis not present

## 2016-03-09 ENCOUNTER — Ambulatory Visit: Payer: Medicare Other | Admitting: Podiatry

## 2016-03-09 DIAGNOSIS — E782 Mixed hyperlipidemia: Secondary | ICD-10-CM | POA: Diagnosis not present

## 2016-03-09 DIAGNOSIS — R7301 Impaired fasting glucose: Secondary | ICD-10-CM | POA: Diagnosis not present

## 2016-03-09 DIAGNOSIS — I10 Essential (primary) hypertension: Secondary | ICD-10-CM | POA: Diagnosis not present

## 2016-03-09 DIAGNOSIS — E559 Vitamin D deficiency, unspecified: Secondary | ICD-10-CM | POA: Diagnosis not present

## 2016-03-11 DIAGNOSIS — E2609 Other primary hyperaldosteronism: Secondary | ICD-10-CM | POA: Diagnosis not present

## 2016-03-11 DIAGNOSIS — R7309 Other abnormal glucose: Secondary | ICD-10-CM | POA: Diagnosis not present

## 2016-03-11 DIAGNOSIS — R7301 Impaired fasting glucose: Secondary | ICD-10-CM | POA: Diagnosis not present

## 2016-03-11 DIAGNOSIS — M8589 Other specified disorders of bone density and structure, multiple sites: Secondary | ICD-10-CM | POA: Diagnosis not present

## 2016-03-11 DIAGNOSIS — R7302 Impaired glucose tolerance (oral): Secondary | ICD-10-CM | POA: Diagnosis not present

## 2016-03-11 DIAGNOSIS — F17210 Nicotine dependence, cigarettes, uncomplicated: Secondary | ICD-10-CM | POA: Diagnosis not present

## 2016-03-11 DIAGNOSIS — E782 Mixed hyperlipidemia: Secondary | ICD-10-CM | POA: Diagnosis not present

## 2016-03-11 DIAGNOSIS — I10 Essential (primary) hypertension: Secondary | ICD-10-CM | POA: Diagnosis not present

## 2016-03-25 DIAGNOSIS — M9903 Segmental and somatic dysfunction of lumbar region: Secondary | ICD-10-CM | POA: Diagnosis not present

## 2016-03-25 DIAGNOSIS — M9904 Segmental and somatic dysfunction of sacral region: Secondary | ICD-10-CM | POA: Diagnosis not present

## 2016-03-25 DIAGNOSIS — M9905 Segmental and somatic dysfunction of pelvic region: Secondary | ICD-10-CM | POA: Diagnosis not present

## 2016-03-25 DIAGNOSIS — M5136 Other intervertebral disc degeneration, lumbar region: Secondary | ICD-10-CM | POA: Diagnosis not present

## 2016-03-30 ENCOUNTER — Encounter: Payer: Self-pay | Admitting: Sports Medicine

## 2016-03-30 ENCOUNTER — Ambulatory Visit (INDEPENDENT_AMBULATORY_CARE_PROVIDER_SITE_OTHER): Payer: Medicare Other | Admitting: Sports Medicine

## 2016-03-30 DIAGNOSIS — Q828 Other specified congenital malformations of skin: Secondary | ICD-10-CM

## 2016-03-30 DIAGNOSIS — M79672 Pain in left foot: Secondary | ICD-10-CM

## 2016-03-30 NOTE — Progress Notes (Signed)
Patient ID: Jenny Oliver, female   DOB: [birth year], 71 y.o.   MRN: [id number]  Subjective: Jenny Oliver is a 71 y.o. female patient seen in office for follow up evaluation of keratosis to left foot. States she bought pads on [employer] which help. Patient has no other pedal complaints at this time.  Patient Active Problem List   Diagnosis Date Noted  . Headache 08/27/2015  . Benign paroxysmal positional vertigo 04/24/2015  . Chronic insomnia 04/24/2015  . Gait disorder 11/06/2014  . Tobacco user 09/08/2013  . Allergic rhinitis 09/08/2013  . Lipid disorder 09/08/2013  . Dyspepsia and disorder of function of stomach 09/08/2013  . Caregiver stress 09/08/2013   Current Outpatient Prescriptions on File Prior to Visit  Medication Sig Dispense Refill  . acetaminophen (TYLENOL) 650 MG CR tablet Take 1,300 mg by mouth daily.     Marland Kitchen aMILoride (MIDAMOR) 5 MG tablet Take 5 mg by mouth daily.    Marland Kitchen ascorbic acid (VITAMIN C) 1000 MG tablet Take 1,000 mg by mouth 2 (two) times daily.    . Calcium Carb-Cholecalciferol (CALCIUM 1000 + D PO) Take 1 capsule by mouth 2 (two) times daily.    . Cholecalciferol (VITAMIN D3) 2000 units TABS Take 1 tablet by mouth daily.    . Coenzyme Q10 (CO Q 10) 100 MG CAPS Take 2 capsules by mouth daily.    . Collagen 500 MG CAPS Take 2 capsules by mouth daily.     . DULoxetine (CYMBALTA) 20 MG capsule Take 1 capsule (20 mg total) by mouth daily. 30 capsule 3  . Glucosamine HCl-MSM 750-750 MG TABS Take 1 tablet by mouth 2 (two) times daily.    . Magnesium 500 MG TABS Take 1 tablet by mouth daily.    . NONFORMULARY OR COMPOUNDED [place] compound:  Antiinflammatory Cream - Diclofenac 3%, Baclofen 2%, Cyclobenzaprine 2%, Lidocaine 2%, apply 1-2 grams to affected area 3-4 times daily. 120 each 3  . Potassium 99 MG TABS Take 2 tablets by mouth daily.     . pravastatin (PRAVACHOL) 80 MG tablet Take 80 mg by mouth daily.    . Probiotic Product (PROBIOTIC DAILY PO) Take  by mouth 2 (two) times daily.     No current facility-administered medications on file prior to visit.    Allergies  Allergen Reactions  . Crestor [Rosuvastatin]   . Lipitor [Atorvastatin]   . Milk-Related Compounds   . Other     Bread, peanuts/tree nuts  . Zetia [Ezetimibe]     No results found for this or any previous visit (from the past 2160 hour(s)).  Objective: There were no vitals filed for this visit.  General: Patient is awake, alert, oriented x 3 and in no acute distress.  Dermatology: Skin is warm and dry bilateral with no other acute signs of infection. There is callus sub met 5 with a nucleated core suggestive of porokeratosis left foot.    Vascular: Dorsalis Pedis pulse = 2/4 Bilateral,  Posterior Tibial pulse = 1/4 Bilateral,  Capillary Fill Time < 5 seconds  Neurologic: Gross sensation present via light touch bilateral. Subjective shooting pain L>R foot. History of sciatica and radiculopathy.   Musculosketal: Mild tenderness to keratosis left foot. No pain with compression to calves bilateral. No symptomatic bony deformities noted bilateral. Floppy left 5th toe from previous surgery.  Assessment and Plan:  Problem List Items Addressed This Visit    None    Visit Diagnoses    Porokeratosis    -  Primary   Left foot pain         -Examined patient  -Parred keratoic lesion sub met 5 on left foot using sterile chisel blade and applied U shaped offloading pad  -Recommend good supportive shoes daily -Patient to return to office as needed for follow up care and evaluation or sooner if problems arise.  Landis Martins, DPM

## 2016-03-31 DIAGNOSIS — J441 Chronic obstructive pulmonary disease with (acute) exacerbation: Secondary | ICD-10-CM | POA: Diagnosis not present

## 2016-03-31 DIAGNOSIS — E559 Vitamin D deficiency, unspecified: Secondary | ICD-10-CM | POA: Diagnosis not present

## 2016-03-31 DIAGNOSIS — E785 Hyperlipidemia, unspecified: Secondary | ICD-10-CM | POA: Diagnosis not present

## 2016-03-31 DIAGNOSIS — M8580 Other specified disorders of bone density and structure, unspecified site: Secondary | ICD-10-CM | POA: Diagnosis not present

## 2016-03-31 DIAGNOSIS — N182 Chronic kidney disease, stage 2 (mild): Secondary | ICD-10-CM | POA: Diagnosis not present

## 2016-04-06 ENCOUNTER — Other Ambulatory Visit (HOSPITAL_COMMUNITY): Payer: Self-pay | Admitting: Respiratory Therapy

## 2016-04-06 DIAGNOSIS — I152 Hypertension secondary to endocrine disorders: Secondary | ICD-10-CM | POA: Diagnosis not present

## 2016-04-06 DIAGNOSIS — E7800 Pure hypercholesterolemia, unspecified: Secondary | ICD-10-CM | POA: Diagnosis not present

## 2016-04-06 DIAGNOSIS — E538 Deficiency of other specified B group vitamins: Secondary | ICD-10-CM | POA: Diagnosis not present

## 2016-04-06 DIAGNOSIS — E2689 Other hyperaldosteronism: Secondary | ICD-10-CM | POA: Diagnosis not present

## 2016-04-06 DIAGNOSIS — J441 Chronic obstructive pulmonary disease with (acute) exacerbation: Secondary | ICD-10-CM

## 2016-04-06 DIAGNOSIS — R5383 Other fatigue: Secondary | ICD-10-CM | POA: Diagnosis not present

## 2016-04-16 ENCOUNTER — Encounter (HOSPITAL_COMMUNITY): Payer: Self-pay

## 2016-04-20 DIAGNOSIS — Z0000 Encounter for general adult medical examination without abnormal findings: Secondary | ICD-10-CM | POA: Diagnosis not present

## 2016-04-23 ENCOUNTER — Encounter (HOSPITAL_COMMUNITY): Payer: Self-pay

## 2016-05-11 ENCOUNTER — Ambulatory Visit (INDEPENDENT_AMBULATORY_CARE_PROVIDER_SITE_OTHER): Payer: Medicare Other | Admitting: Adult Health

## 2016-05-11 ENCOUNTER — Encounter: Payer: Self-pay | Admitting: Adult Health

## 2016-05-11 VITALS — BP 125/75 | HR 75 | Ht 63.0 in | Wt 136.2 lb

## 2016-05-11 DIAGNOSIS — R269 Unspecified abnormalities of gait and mobility: Secondary | ICD-10-CM

## 2016-05-11 DIAGNOSIS — G441 Vascular headache, not elsewhere classified: Secondary | ICD-10-CM

## 2016-05-11 MED ORDER — DULOXETINE HCL 20 MG PO CPEP: 20.0000 mg | ORAL_CAPSULE | Freq: Every day | ORAL | 11 refills | Status: DC

## 2016-05-11 NOTE — Patient Instructions (Signed)
Continue Cymbalta 20 mg at bedtime In the future we can consider increase Cymbalta to 30 mg daily If your symptoms worsen or you develop new symptoms please let us know.

## 2016-05-11 NOTE — Progress Notes (Signed)
I have read the note, and I agree with the clinical assessment and plan.  Natayla Cadenhead KEITH   

## 2016-05-11 NOTE — Progress Notes (Signed)
PATIENT: Jenny Oliver DOB: 07-23-1945  REASON FOR VISIT: follow up- headache HISTORY FROM: patient  HISTORY OF PRESENT ILLNESS: Jenny Oliver is a 71 year old female with a history of headache. She returns today for follow-up. She was placed on Cymbalta 20 mg at bedtime. She reports that this has been beneficial for her headaches. Although she does report she continues to get headaches daily. She reports that the timing of her headaches can vary. She will sometimes wake up with the headaches other days she will get them later in the day. She also reports that theseverity of the headaches vary as well. She states most of her headaches occur in the neck and back of the head. She has been seeing a chiropractor however has not been recently. She states Cymbalta has helped with her sleep. She continues to have trouble with her balance. She denies any falls but does state that she stumbles. She has been to physical therapy in the past but reports that it made her balance worse. She returns today for an evaluation.  HISTORY 01/07/16:Jenny Oliver is a 71 year old right-handed black female with a history of headache. The patient was placed on Topamax but this resulted in too much drowsiness and she had to stop the medication. The patient has been having some problems with low back pain, she has been seen by Dr. Teryl Lucy for chiropractic treatments which have been very helpful. With the treatments of the low back, she has noticed that her headaches have improved. She does at times have neck pain and neck stiffness, occasionally the headaches may come up from the back of the head. The headaches may also be bifrontal in nature. She is now having about 7 or 8 headache days a month. She does have some crepitus in the neck at times. When the headaches improve, she feels that the balance problems also improve. She returns to this office for an evaluation.    REVIEW OF SYSTEMS: Out of a complete 14 system review of  symptoms, the patient complains only of the following symptoms, and all other reviewed systems are negative.  See history of present illness  ALLERGIES: Allergies  Allergen Reactions  . Crestor [Rosuvastatin]   . Lipitor [Atorvastatin]   . Milk-Related Compounds   . Other     Bread, peanuts/tree nuts  . Zetia [Ezetimibe]     HOME MEDICATIONS: Outpatient Medications Prior to Visit  Medication Sig Dispense Refill  . acetaminophen (TYLENOL) 650 MG CR tablet Take 1,300 mg by mouth daily.     Marland Kitchen aMILoride (MIDAMOR) 5 MG tablet Take 5 mg by mouth daily.    Marland Kitchen ascorbic acid (VITAMIN C) 1000 MG tablet Take 1,000 mg by mouth 2 (two) times daily.    . Calcium Carb-Cholecalciferol (CALCIUM 1000 + D PO) Take 1 capsule by mouth 2 (two) times daily.    . Cholecalciferol (VITAMIN D3) 2000 units TABS Take 1 tablet by mouth daily.    . Coenzyme Q10 (CO Q 10) 100 MG CAPS Take 2 capsules by mouth daily.    . Collagen 500 MG CAPS Take 2 capsules by mouth daily.     . DULoxetine (CYMBALTA) 20 MG capsule Take 1 capsule (20 mg total) by mouth daily. 30 capsule 3  . Glucosamine HCl-MSM 750-750 MG TABS Take 1 tablet by mouth 2 (two) times daily.    . Magnesium 500 MG TABS Take 1 tablet by mouth daily.    . NONFORMULARY OR COMPOUNDED [place] compound:  Antiinflammatory Cream - Diclofenac 3%, Baclofen 2%, Cyclobenzaprine 2%, Lidocaine 2%, apply 1-2 grams to affected area 3-4 times daily. 120 each 3  . Potassium 99 MG TABS Take 2 tablets by mouth daily.     . pravastatin (PRAVACHOL) 80 MG tablet Take 80 mg by mouth daily.    . Probiotic Product (PROBIOTIC DAILY PO) Take by mouth 2 (two) times daily.     No facility-administered medications prior to visit.     PAST MEDICAL HISTORY: Past Medical History:  Diagnosis Date  . Benign paroxysmal positional vertigo 04/24/2015  . Chronic insomnia 04/24/2015  . Chronic kidney disease   . COPD (chronic obstructive pulmonary disease) ([place])   . Gait  disorder 11/06/2014  . GERD (gastroesophageal reflux disease)   . Headache 08/27/2015  . 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 OA (osteoarthritis)   . Sciatica   . Vitamin B12 deficiency   . Vitamin D deficiency     PAST SURGICAL HISTORY: Past Surgical History:  Procedure Laterality Date  . BALLOON DILATION  11/03/2011   Procedure: BALLOON DILATION;  Surgeon: Arta Silence, MD;  Location: [facility];  Service: Endoscopy;  Laterality: N/A;  . PARTIAL HYSTERECTOMY  1994  . TONSILLECTOMY      FAMILY HISTORY: Family History  Problem Relation Age of Onset  . Heart disease Mother   . Hyperlipidemia Mother   . Cancer Father     stomach  . Healthy Brother     SOCIAL HISTORY: Social History   Social History  . Marital status: Divorced    Spouse name: N/A  . Number of children: 1  . Years of education: graduate   Occupational History  . retired    Social History Main Topics  . Smoking status: Current Every Day Smoker    Packs/day: 1.00  . Smokeless tobacco: Never Used  . Alcohol use No  . Drug use: No  . Sexual activity: Not on file   Other Topics Concern  . Not on file   Social History Narrative   Patient drinks about 2 cups of caffeine daily.   Patient is right handed.      PHYSICAL EXAM  Vitals:   05/11/16 1259  BP: 125/75  Pulse: 75  Weight: 136 lb 3.2 oz (61.8 kg)  Height: 5\' 3"  (1.6 m)   Body mass index is 24.13 kg/m.  Generalized: Well developed, in no acute distress   Neurological examination  Mentation: Alert oriented to time, place, history taking. Follows all commands speech and language fluent Cranial nerve II-XII: Pupils were equal round reactive to light. Extraocular movements were full, visual field were full on confrontational test. Facial sensation and strength were normal. Uvula tongue midline. Head turning and shoulder shrug  were normal and symmetric. Motor: The motor testing reveals 5 over 5 strength of all 4 extremities. Good symmetric  motor tone is noted throughout.  Sensory: Sensory testing is intact to soft touch on all 4 extremities. No evidence of extinction is noted.  Coordination: Cerebellar testing reveals good finger-nose-finger and heel-to-shin bilaterally.  Gait and station: Gait is normal.  Reflexes: Deep tendon reflexes are symmetric and normal bilaterally.   DIAGNOSTIC DATA (LABS, IMAGING, TESTING) - I reviewed patient records, labs, notes, testing and imaging myself where available.     Lab Results  Component Value Date   TSH 0.766 09/06/2013      ASSESSMENT AND PLAN 71 y.o. 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 here with:  1. Headache 2. Abnormality of gait and balance  The patient is having daily headaches. I advised that we can increase Cymbalta to 30 mg at bedtime. At this time the patient does not want to increase her medication. She reports that she would rather stay on 20 mg daily. Patient advised that if her symptoms worsen or she develops new symptoms she should let us know. She will follow-up in 6 months or sooner if needed.     Ward Givens, MSN, NP-C 05/11/2016, 1:07 PM [facility] [address], [place], [place] [address] [phone]

## 2016-05-13 ENCOUNTER — Ambulatory Visit (HOSPITAL_COMMUNITY)
Admission: RE | Admit: 2016-05-13 | Discharge: 2016-05-13 | Disposition: A | Discharge status: Home / Self Care | Payer: Medicare Other | Source: Ambulatory Visit | Attending: Internal Medicine | Admitting: Internal Medicine

## 2016-05-13 DIAGNOSIS — J441 Chronic obstructive pulmonary disease with (acute) exacerbation: Secondary | ICD-10-CM | POA: Diagnosis not present

## 2016-05-13 LAB — SPIROMETRY WITH GRAPH
FEF 25-75 Post: 0.89 L/sec
FEF 25-75 Pre: 0.83 L/sec
FEF2575-%CHANGE-POST: 6 %
FEF2575-%PRED-POST: 54 %
FEF2575-%Pred-Pre: 51 %
FEV1-%CHANGE-POST: 3 %
FEV1-%Pred-Post: 84 %
FEV1-%Pred-Pre: 81 %
FEV1-PRE: 1.43 L
FEV1-Post: 1.48 L
FEV1FVC-%CHANGE-POST: 3 %
FEV1FVC-%Pred-Pre: 88 %
FEV6-%Change-Post: 0 %
FEV6-%PRED-PRE: 96 %
FEV6-%Pred-Post: 96 %
FEV6-PRE: 2.09 L
FEV6-Post: 2.09 L
FEV6FVC-%Change-Post: 0 %
FEV6FVC-%PRED-PRE: 103 %
FEV6FVC-%Pred-Post: 103 %
FVC-%CHANGE-POST: 0 %
FVC-%Pred-Post: 93 %
FVC-%Pred-Pre: 92 %
FVC-Post: 2.11 L
FVC-Pre: 2.1 L
POST FEV1/FVC RATIO: 70 %
POST FEV6/FVC RATIO: 99 %
PRE FEV6/FVC RATIO: 99 %
Pre FEV1/FVC ratio: 68 %

## 2016-05-13 MED ORDER — ALBUTEROL SULFATE (2.5 MG/3ML) 0.083% IN NEBU
2.5000 mg | INHALATION_SOLUTION | Freq: Once | RESPIRATORY_TRACT | Status: AC
  Administered 2016-05-13: 2.5 mg via RESPIRATORY_TRACT

## 2016-05-14 DIAGNOSIS — M5136 Other intervertebral disc degeneration, lumbar region: Secondary | ICD-10-CM | POA: Diagnosis not present

## 2016-05-14 DIAGNOSIS — M50322 Other cervical disc degeneration at C5-C6 level: Secondary | ICD-10-CM | POA: Diagnosis not present

## 2016-05-14 DIAGNOSIS — M9903 Segmental and somatic dysfunction of lumbar region: Secondary | ICD-10-CM | POA: Diagnosis not present

## 2016-05-14 DIAGNOSIS — M9901 Segmental and somatic dysfunction of cervical region: Secondary | ICD-10-CM | POA: Diagnosis not present

## 2016-05-25 DIAGNOSIS — M50322 Other cervical disc degeneration at C5-C6 level: Secondary | ICD-10-CM | POA: Diagnosis not present

## 2016-05-25 DIAGNOSIS — M9901 Segmental and somatic dysfunction of cervical region: Secondary | ICD-10-CM | POA: Diagnosis not present

## 2016-05-25 DIAGNOSIS — M5136 Other intervertebral disc degeneration, lumbar region: Secondary | ICD-10-CM | POA: Diagnosis not present

## 2016-05-25 DIAGNOSIS — M9903 Segmental and somatic dysfunction of lumbar region: Secondary | ICD-10-CM | POA: Diagnosis not present

## 2016-06-10 DIAGNOSIS — M50322 Other cervical disc degeneration at C5-C6 level: Secondary | ICD-10-CM | POA: Diagnosis not present

## 2016-06-10 DIAGNOSIS — M9901 Segmental and somatic dysfunction of cervical region: Secondary | ICD-10-CM | POA: Diagnosis not present

## 2016-06-10 DIAGNOSIS — M5136 Other intervertebral disc degeneration, lumbar region: Secondary | ICD-10-CM | POA: Diagnosis not present

## 2016-06-10 DIAGNOSIS — M9903 Segmental and somatic dysfunction of lumbar region: Secondary | ICD-10-CM | POA: Diagnosis not present

## 2016-07-08 DIAGNOSIS — M9901 Segmental and somatic dysfunction of cervical region: Secondary | ICD-10-CM | POA: Diagnosis not present

## 2016-07-08 DIAGNOSIS — M5136 Other intervertebral disc degeneration, lumbar region: Secondary | ICD-10-CM | POA: Diagnosis not present

## 2016-07-08 DIAGNOSIS — M9903 Segmental and somatic dysfunction of lumbar region: Secondary | ICD-10-CM | POA: Diagnosis not present

## 2016-07-08 DIAGNOSIS — M50322 Other cervical disc degeneration at C5-C6 level: Secondary | ICD-10-CM | POA: Diagnosis not present

## 2016-07-09 DIAGNOSIS — M50322 Other cervical disc degeneration at C5-C6 level: Secondary | ICD-10-CM | POA: Diagnosis not present

## 2016-07-09 DIAGNOSIS — M9903 Segmental and somatic dysfunction of lumbar region: Secondary | ICD-10-CM | POA: Diagnosis not present

## 2016-07-09 DIAGNOSIS — M5136 Other intervertebral disc degeneration, lumbar region: Secondary | ICD-10-CM | POA: Diagnosis not present

## 2016-07-09 DIAGNOSIS — M9901 Segmental and somatic dysfunction of cervical region: Secondary | ICD-10-CM | POA: Diagnosis not present

## 2016-08-13 DIAGNOSIS — M9903 Segmental and somatic dysfunction of lumbar region: Secondary | ICD-10-CM | POA: Diagnosis not present

## 2016-08-13 DIAGNOSIS — M50322 Other cervical disc degeneration at C5-C6 level: Secondary | ICD-10-CM | POA: Diagnosis not present

## 2016-08-13 DIAGNOSIS — M9901 Segmental and somatic dysfunction of cervical region: Secondary | ICD-10-CM | POA: Diagnosis not present

## 2016-08-13 DIAGNOSIS — M5000 Cervical disc disorder with myelopathy, unspecified cervical region: Secondary | ICD-10-CM | POA: Diagnosis not present

## 2016-08-20 DIAGNOSIS — M9903 Segmental and somatic dysfunction of lumbar region: Secondary | ICD-10-CM | POA: Diagnosis not present

## 2016-08-20 DIAGNOSIS — M50322 Other cervical disc degeneration at C5-C6 level: Secondary | ICD-10-CM | POA: Diagnosis not present

## 2016-08-20 DIAGNOSIS — M9901 Segmental and somatic dysfunction of cervical region: Secondary | ICD-10-CM | POA: Diagnosis not present

## 2016-08-20 DIAGNOSIS — M5136 Other intervertebral disc degeneration, lumbar region: Secondary | ICD-10-CM | POA: Diagnosis not present

## 2016-09-03 DIAGNOSIS — M5136 Other intervertebral disc degeneration, lumbar region: Secondary | ICD-10-CM | POA: Diagnosis not present

## 2016-09-03 DIAGNOSIS — M50322 Other cervical disc degeneration at C5-C6 level: Secondary | ICD-10-CM | POA: Diagnosis not present

## 2016-09-03 DIAGNOSIS — M9901 Segmental and somatic dysfunction of cervical region: Secondary | ICD-10-CM | POA: Diagnosis not present

## 2016-09-03 DIAGNOSIS — M9903 Segmental and somatic dysfunction of lumbar region: Secondary | ICD-10-CM | POA: Diagnosis not present

## 2016-11-11 ENCOUNTER — Encounter: Payer: Self-pay | Admitting: Neurology

## 2016-11-11 ENCOUNTER — Ambulatory Visit (INDEPENDENT_AMBULATORY_CARE_PROVIDER_SITE_OTHER): Payer: Medicare Other | Admitting: Neurology

## 2016-11-11 VITALS — BP 128/70 | HR 76 | Ht 63.0 in | Wt 132.5 lb

## 2016-11-11 DIAGNOSIS — G441 Vascular headache, not elsewhere classified: Secondary | ICD-10-CM

## 2016-11-11 NOTE — Progress Notes (Signed)
Reason for visit: Headache  Jenny Oliver is an 71 y.o. female  History of present illness:  Jenny Oliver is a 71 year old right-handed black female with a history of headaches. Over time, her headaches have become less severe and less frequent. She is having anywhere from 5-10 headache days a month, her headaches often times will be present in the morning and last only about 20 minutes. She takes 650 mg tablets of Tylenol, 2 tablets daily. She does not take any other medications for her headache. She has been found to have a low vitamin B12 level, this will be followed through her primary care physician. The patient is having some occasional events of feeling lightheaded, hot. She may have some tightness in the chest. She has not had any overt syncopal events. She has discussed this with her primary care physician. The patient has not been able to tolerate Topamax, and gabapentin. She is on very low-dose Cymbalta taking 20 mg daily. She indicates that this makes her sleepy, but she has not been sleeping well recently.  Past Medical History:  Diagnosis Date  . Benign paroxysmal positional vertigo 04/24/2015  . Chronic insomnia 04/24/2015  . Chronic kidney disease   . COPD (chronic obstructive pulmonary disease) ([place])   . Gait disorder 11/06/2014  . GERD (gastroesophageal reflux disease)   . Headache 08/27/2015  . 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 OA (osteoarthritis)   . Sciatica   . Vitamin B12 deficiency   . Vitamin D deficiency     Past Surgical History:  Procedure Laterality Date  . BALLOON DILATION  11/03/2011   Procedure: BALLOON DILATION;  Surgeon: Arta Silence, MD;  Location: [facility];  Service: Endoscopy;  Laterality: N/A;  . PARTIAL HYSTERECTOMY  1994  . TONSILLECTOMY      Family History  Problem Relation Age of Onset  . Heart disease Mother   . Hyperlipidemia Mother   . Cancer Father        stomach  . Healthy Brother     Social history:  reports that she has been  smoking.  She has been smoking about 1.00 pack per day. She has never used smokeless tobacco. She reports that she does not drink alcohol or use drugs.    Allergies  Allergen Reactions  . Crestor [Rosuvastatin]   . Lipitor [Atorvastatin]   . Milk-Related Compounds   . Other     Bread, peanuts/tree nuts  . Zetia [Ezetimibe]     Medications:  Prior to Admission medications   Medication Sig Start Date End Date Taking? Authorizing Provider  acetaminophen (TYLENOL) 650 MG CR tablet Take 1,300 mg by mouth daily.    Yes [provider]  aMILoride (MIDAMOR) 5 MG tablet Take 5 mg by mouth daily.   Yes [provider]  ascorbic acid (VITAMIN C) 1000 MG tablet Take 1,000 mg by mouth 2 (two) times daily.   Yes [provider]  Calcium Carb-Cholecalciferol (CALCIUM 1000 + D PO) Take 1 capsule by mouth daily.    Yes [provider]  Cholecalciferol (VITAMIN D3) 2000 units TABS Take 1 tablet by mouth daily.   Yes [provider]  Coenzyme Q10 (CO Q 10) 100 MG CAPS Take 1 capsule by mouth daily.    Yes [provider]  Collagen 500 MG CAPS Take 2 capsules by mouth daily.    Yes [provider]  DULoxetine (CYMBALTA) 20 MG capsule Take 1 capsule (20 mg total) by mouth daily. 05/11/16  Yes Ward Givens, NP  Glucosamine HCl-MSM 750-750 MG TABS Take 1 tablet by mouth 2 (two) times daily.   Yes [provider]  Magnesium 500 MG TABS Take 1 tablet by mouth daily.   Yes [provider]  NONFORMULARY OR COMPOUNDED Saxon compound:  Antiinflammatory Cream - Diclofenac 3%, Baclofen 2%, Cyclobenzaprine 2%, Lidocaine 2%, apply 1-2 grams to affected area 3-4 times daily. 10/07/15  Yes Landis Martins, DPM  Potassium 99 MG TABS Take 1 tablet by mouth daily.    Yes [provider]  pravastatin (PRAVACHOL) 80 MG tablet Take 40 mg by mouth daily.    Yes [provider]  Probiotic Product (PROBIOTIC DAILY  PO) Take by mouth 2 (two) times daily.   Yes [provider]    ROS:  Out of a complete 14 system review of symptoms, the patient complains only of the following symptoms, and all other reviewed systems are negative.  Decreased appetite, fatigue Ringing in the ears Cough, shortness of breath, chest tightness Insomnia, frequent waking Food allergies Low back pain, muscle cramps, walking difficulty, neck pain, neck stiffness Itching Memory loss, dizziness, headache  Blood pressure 128/70, pulse 76, weight 132 lb 8 oz (60.1 kg), SpO2 96 %.  Physical Exam  General: The patient is alert and cooperative at the time of the examination.  Skin: No significant peripheral edema is noted.   Neurologic Exam  Mental status: The patient is alert and oriented x 3 at the time of the examination. The patient has apparent normal recent and remote memory, with an apparently normal attention span and concentration ability.   Cranial nerves: Facial symmetry is present. Speech is normal, no aphasia or dysarthria is noted. Extraocular movements are full. Visual fields are full.  Motor: The patient has good strength in all 4 extremities.  Sensory examination: Soft touch sensation is symmetric on the face, arms, and legs.  Coordination: The patient has good finger-nose-finger and heel-to-shin bilaterally.  Gait and station: The patient has a normal gait. Tandem gait is normal. Romberg is negative. No drift is seen.  Reflexes: Deep tendon reflexes are symmetric.   Assessment/Plan:  1. Headache  The patient is doing well with Cymbalta, she does report some chronic insomnia issues but she does not wish to go on Cymbalta to help her rest better. Her headaches have done much better on the Cymbalta, we will continue the current dose. She will follow-up in one year, sooner if needed.    Jill Alexanders MD 11/11/2016 1:29 PM  [facility] [address] [place], [place] [address]  Phone [phone] Fax [phone]

## 2016-11-19 DIAGNOSIS — E7800 Pure hypercholesterolemia, unspecified: Secondary | ICD-10-CM | POA: Diagnosis not present

## 2016-11-26 DIAGNOSIS — E7800 Pure hypercholesterolemia, unspecified: Secondary | ICD-10-CM | POA: Diagnosis not present

## 2016-11-26 DIAGNOSIS — F17210 Nicotine dependence, cigarettes, uncomplicated: Secondary | ICD-10-CM | POA: Diagnosis not present

## 2016-11-26 DIAGNOSIS — Z23 Encounter for immunization: Secondary | ICD-10-CM | POA: Diagnosis not present

## 2016-11-26 DIAGNOSIS — E538 Deficiency of other specified B group vitamins: Secondary | ICD-10-CM | POA: Diagnosis not present

## 2016-11-30 DIAGNOSIS — M50322 Other cervical disc degeneration at C5-C6 level: Secondary | ICD-10-CM | POA: Diagnosis not present

## 2016-11-30 DIAGNOSIS — M9902 Segmental and somatic dysfunction of thoracic region: Secondary | ICD-10-CM | POA: Diagnosis not present

## 2016-11-30 DIAGNOSIS — M5134 Other intervertebral disc degeneration, thoracic region: Secondary | ICD-10-CM | POA: Diagnosis not present

## 2016-11-30 DIAGNOSIS — M9901 Segmental and somatic dysfunction of cervical region: Secondary | ICD-10-CM | POA: Diagnosis not present

## 2016-12-23 ENCOUNTER — Other Ambulatory Visit: Payer: Self-pay | Admitting: Acute Care

## 2016-12-23 DIAGNOSIS — Z122 Encounter for screening for malignant neoplasm of respiratory organs: Secondary | ICD-10-CM

## 2016-12-23 DIAGNOSIS — F17210 Nicotine dependence, cigarettes, uncomplicated: Principal | ICD-10-CM

## 2016-12-30 DIAGNOSIS — M5134 Other intervertebral disc degeneration, thoracic region: Secondary | ICD-10-CM | POA: Diagnosis not present

## 2016-12-30 DIAGNOSIS — M9902 Segmental and somatic dysfunction of thoracic region: Secondary | ICD-10-CM | POA: Diagnosis not present

## 2016-12-30 DIAGNOSIS — M50322 Other cervical disc degeneration at C5-C6 level: Secondary | ICD-10-CM | POA: Diagnosis not present

## 2016-12-30 DIAGNOSIS — M9901 Segmental and somatic dysfunction of cervical region: Secondary | ICD-10-CM | POA: Diagnosis not present

## 2016-12-31 ENCOUNTER — Ambulatory Visit (INDEPENDENT_AMBULATORY_CARE_PROVIDER_SITE_OTHER)
Admission: RE | Admit: 2016-12-31 | Discharge: 2016-12-31 | Disposition: A | Discharge status: Home / Self Care | Payer: Medicare Other | Source: Ambulatory Visit | Attending: Acute Care | Admitting: Acute Care

## 2016-12-31 ENCOUNTER — Ambulatory Visit (INDEPENDENT_AMBULATORY_CARE_PROVIDER_SITE_OTHER): Payer: Medicare Other | Admitting: Acute Care

## 2016-12-31 ENCOUNTER — Encounter: Payer: Self-pay | Admitting: Acute Care

## 2016-12-31 DIAGNOSIS — F17210 Nicotine dependence, cigarettes, uncomplicated: Secondary | ICD-10-CM

## 2016-12-31 DIAGNOSIS — Z87891 Personal history of nicotine dependence: Secondary | ICD-10-CM | POA: Diagnosis not present

## 2016-12-31 DIAGNOSIS — Z122 Encounter for screening for malignant neoplasm of respiratory organs: Secondary | ICD-10-CM

## 2016-12-31 NOTE — Progress Notes (Signed)
Shared Decision Making Visit Lung Cancer Screening Program [id number])   Eligibility:  Age 71 y.o.  Pack Years Smoking History Calculation 72 pack year smoking history (# packs/per year x # years smoked)  Recent History of coughing up blood  no  Unexplained weight loss? no ( >Than 15 pounds within the last 6 months )  Prior History Lung / other cancer no (Diagnosis within the last 5 years already requiring surveillance chest CT Scans).  Smoking Status Current Smoker  Former Smokers: Years since quit: NA  Quit Date: NA  Visit Components:  Discussion included one or more decision making aids. yes  Discussion included risk/benefits of screening. yes  Discussion included potential follow up diagnostic testing for abnormal scans. yes  Discussion included meaning and risk of over diagnosis. yes  Discussion included meaning and risk of False Positives. yes  Discussion included meaning of total radiation exposure. yes  Counseling Included:  Importance of adherence to annual lung cancer LDCT screening. yes  Impact of comorbidities on ability to participate in the program. yes  Ability and willingness to under diagnostic treatment. yes  Smoking Cessation Counseling:  Current Smokers:   Discussed importance of smoking cessation. yes  Information about tobacco cessation classes and interventions provided to patient. yes  Patient provided with "ticket" for LDCT Scan. yes  Symptomatic Patient. no  Counseling  Diagnosis Code: Tobacco Use Z72.0  Asymptomatic Patient yes  Counseling (Intermediate counseling: > three minutes counseling) G2694  Former Smokers:   Discussed the importance of maintaining cigarette abstinence. yes  Diagnosis Code: Personal History of Nicotine Dependence. W54.627  Information about tobacco cessation classes and interventions provided to patient. Yes  Patient provided with "ticket" for LDCT Scan. yes  Written Order for Lung Cancer  Screening with LDCT placed in Epic. Yes (CT Chest Lung Cancer Screening Low Dose W/O CM) [id number] Z12.2-Screening of respiratory organs Z87.891-Personal history of nicotine dependence  I have spent 25 minutes of face to face time with Jenny Oliver discussing the risks and benefits of lung cancer screening. We viewed a power point together that explained in detail the above noted topics. We paused at intervals to allow for questions to be asked and answered to ensure understanding.We discussed that the single most powerful action that she can take to decrease her risk of developing lung cancer is to quit smoking. We discussed whether or not she is ready to commit to setting a quit date. She is currently not ready to set a quit date. We discussed options for tools to aid in quitting smoking including nicotine replacement therapy, non-nicotine medications, support groups, Quit Smart classes, and behavior modification. We discussed that often times setting smaller, more achievable goals, such as eliminating 1 cigarette a day for a week and then 2 cigarettes a day for a week can be helpful in slowly decreasing the number of cigarettes smoked. This allows for a sense of accomplishment as well as providing a clinical benefit. I gave her the " Be Stronger Than Your Excuses" card with contact information for community resources, classes, free nicotine replacement therapy, and access to mobile apps, text messaging, and on-line smoking cessation help. I have also given her my card and contact information in the event she needs to contact me. We discussed the time and location of the scan, and that either Doroteo Glassman RN or I will call with the results within 24-48 hours of receiving them. I have offered Jenny Oliver  a copy of the power point we viewed  as a resource in the event they need reinforcement of the concepts we discussed today in the office. The patient verbalized understanding of all of  the above and had no  further questions upon leaving the office. They have my contact information in the event they have any further questions.  I spent 4 minutes counseling on smoking cessation and the health risks of continued tobacco abuse.  I explained to the patient that there has been a high incidence of coronary artery disease noted on these exams. I explained that this is a non-gated exam therefore degree or severity cannot be determined. This patient is currently on statin therapy. I have asked the patient to follow-up with their PCP regarding any incidental finding of coronary artery disease and management with diet or medication as their PCP  feels is clinically indicated. The patient verbalized understanding of the above and had no further questions upon completion of the visit.      Jenny Spatz, NP 12/31/2016

## 2017-01-10 ENCOUNTER — Other Ambulatory Visit: Payer: Self-pay | Admitting: Acute Care

## 2017-01-10 DIAGNOSIS — Z122 Encounter for screening for malignant neoplasm of respiratory organs: Secondary | ICD-10-CM

## 2017-01-10 DIAGNOSIS — F17210 Nicotine dependence, cigarettes, uncomplicated: Principal | ICD-10-CM

## 2017-01-21 DIAGNOSIS — M50322 Other cervical disc degeneration at C5-C6 level: Secondary | ICD-10-CM | POA: Diagnosis not present

## 2017-01-21 DIAGNOSIS — M9901 Segmental and somatic dysfunction of cervical region: Secondary | ICD-10-CM | POA: Diagnosis not present

## 2017-01-21 DIAGNOSIS — M5134 Other intervertebral disc degeneration, thoracic region: Secondary | ICD-10-CM | POA: Diagnosis not present

## 2017-01-21 DIAGNOSIS — M9902 Segmental and somatic dysfunction of thoracic region: Secondary | ICD-10-CM | POA: Diagnosis not present

## 2017-01-24 DIAGNOSIS — M5134 Other intervertebral disc degeneration, thoracic region: Secondary | ICD-10-CM | POA: Diagnosis not present

## 2017-01-24 DIAGNOSIS — M50322 Other cervical disc degeneration at C5-C6 level: Secondary | ICD-10-CM | POA: Diagnosis not present

## 2017-01-24 DIAGNOSIS — M9901 Segmental and somatic dysfunction of cervical region: Secondary | ICD-10-CM | POA: Diagnosis not present

## 2017-01-24 DIAGNOSIS — M9902 Segmental and somatic dysfunction of thoracic region: Secondary | ICD-10-CM | POA: Diagnosis not present

## 2017-01-26 ENCOUNTER — Ambulatory Visit (INDEPENDENT_AMBULATORY_CARE_PROVIDER_SITE_OTHER): Payer: Medicare Other | Admitting: Psychology

## 2017-01-26 DIAGNOSIS — F332 Major depressive disorder, recurrent severe without psychotic features: Secondary | ICD-10-CM | POA: Diagnosis not present

## 2017-01-27 DIAGNOSIS — M5134 Other intervertebral disc degeneration, thoracic region: Secondary | ICD-10-CM | POA: Diagnosis not present

## 2017-01-27 DIAGNOSIS — M50322 Other cervical disc degeneration at C5-C6 level: Secondary | ICD-10-CM | POA: Diagnosis not present

## 2017-01-27 DIAGNOSIS — M9902 Segmental and somatic dysfunction of thoracic region: Secondary | ICD-10-CM | POA: Diagnosis not present

## 2017-01-27 DIAGNOSIS — M9901 Segmental and somatic dysfunction of cervical region: Secondary | ICD-10-CM | POA: Diagnosis not present

## 2017-02-04 DIAGNOSIS — M9901 Segmental and somatic dysfunction of cervical region: Secondary | ICD-10-CM | POA: Diagnosis not present

## 2017-02-04 DIAGNOSIS — M9902 Segmental and somatic dysfunction of thoracic region: Secondary | ICD-10-CM | POA: Diagnosis not present

## 2017-02-04 DIAGNOSIS — M5134 Other intervertebral disc degeneration, thoracic region: Secondary | ICD-10-CM | POA: Diagnosis not present

## 2017-02-04 DIAGNOSIS — M50322 Other cervical disc degeneration at C5-C6 level: Secondary | ICD-10-CM | POA: Diagnosis not present

## 2017-02-24 DIAGNOSIS — M50322 Other cervical disc degeneration at C5-C6 level: Secondary | ICD-10-CM | POA: Diagnosis not present

## 2017-02-24 DIAGNOSIS — M5134 Other intervertebral disc degeneration, thoracic region: Secondary | ICD-10-CM | POA: Diagnosis not present

## 2017-02-24 DIAGNOSIS — M9901 Segmental and somatic dysfunction of cervical region: Secondary | ICD-10-CM | POA: Diagnosis not present

## 2017-02-24 DIAGNOSIS — M9902 Segmental and somatic dysfunction of thoracic region: Secondary | ICD-10-CM | POA: Diagnosis not present

## 2017-03-07 ENCOUNTER — Ambulatory Visit: Payer: PRIVATE HEALTH INSURANCE | Admitting: Psychology

## 2017-03-23 ENCOUNTER — Ambulatory Visit: Payer: PRIVATE HEALTH INSURANCE | Admitting: Psychology

## 2017-03-24 DIAGNOSIS — M9902 Segmental and somatic dysfunction of thoracic region: Secondary | ICD-10-CM | POA: Diagnosis not present

## 2017-03-24 DIAGNOSIS — M5134 Other intervertebral disc degeneration, thoracic region: Secondary | ICD-10-CM | POA: Diagnosis not present

## 2017-03-24 DIAGNOSIS — M50322 Other cervical disc degeneration at C5-C6 level: Secondary | ICD-10-CM | POA: Diagnosis not present

## 2017-03-24 DIAGNOSIS — M9901 Segmental and somatic dysfunction of cervical region: Secondary | ICD-10-CM | POA: Diagnosis not present

## 2017-04-21 DIAGNOSIS — M9901 Segmental and somatic dysfunction of cervical region: Secondary | ICD-10-CM | POA: Diagnosis not present

## 2017-04-21 DIAGNOSIS — M5134 Other intervertebral disc degeneration, thoracic region: Secondary | ICD-10-CM | POA: Diagnosis not present

## 2017-04-21 DIAGNOSIS — M9902 Segmental and somatic dysfunction of thoracic region: Secondary | ICD-10-CM | POA: Diagnosis not present

## 2017-04-21 DIAGNOSIS — M50322 Other cervical disc degeneration at C5-C6 level: Secondary | ICD-10-CM | POA: Diagnosis not present

## 2017-06-29 DIAGNOSIS — J4541 Moderate persistent asthma with (acute) exacerbation: Secondary | ICD-10-CM | POA: Diagnosis not present

## 2017-06-29 DIAGNOSIS — J449 Chronic obstructive pulmonary disease, unspecified: Secondary | ICD-10-CM | POA: Diagnosis not present

## 2017-09-13 DIAGNOSIS — R109 Unspecified abdominal pain: Secondary | ICD-10-CM | POA: Diagnosis not present

## 2017-09-13 DIAGNOSIS — Z86010 Personal history of colonic polyps: Secondary | ICD-10-CM | POA: Diagnosis not present

## 2017-09-13 DIAGNOSIS — R634 Abnormal weight loss: Secondary | ICD-10-CM | POA: Diagnosis not present

## 2017-09-14 DIAGNOSIS — M9904 Segmental and somatic dysfunction of sacral region: Secondary | ICD-10-CM | POA: Diagnosis not present

## 2017-09-14 DIAGNOSIS — H25013 Cortical age-related cataract, bilateral: Secondary | ICD-10-CM | POA: Diagnosis not present

## 2017-09-14 DIAGNOSIS — M9905 Segmental and somatic dysfunction of pelvic region: Secondary | ICD-10-CM | POA: Diagnosis not present

## 2017-09-14 DIAGNOSIS — H2513 Age-related nuclear cataract, bilateral: Secondary | ICD-10-CM | POA: Diagnosis not present

## 2017-09-14 DIAGNOSIS — M5136 Other intervertebral disc degeneration, lumbar region: Secondary | ICD-10-CM | POA: Diagnosis not present

## 2017-09-14 DIAGNOSIS — M9903 Segmental and somatic dysfunction of lumbar region: Secondary | ICD-10-CM | POA: Diagnosis not present

## 2017-09-14 DIAGNOSIS — H524 Presbyopia: Secondary | ICD-10-CM | POA: Diagnosis not present

## 2017-09-14 DIAGNOSIS — H52203 Unspecified astigmatism, bilateral: Secondary | ICD-10-CM | POA: Diagnosis not present

## 2017-09-27 DIAGNOSIS — M9904 Segmental and somatic dysfunction of sacral region: Secondary | ICD-10-CM | POA: Diagnosis not present

## 2017-09-27 DIAGNOSIS — M5136 Other intervertebral disc degeneration, lumbar region: Secondary | ICD-10-CM | POA: Diagnosis not present

## 2017-09-27 DIAGNOSIS — M9903 Segmental and somatic dysfunction of lumbar region: Secondary | ICD-10-CM | POA: Diagnosis not present

## 2017-09-27 DIAGNOSIS — M9905 Segmental and somatic dysfunction of pelvic region: Secondary | ICD-10-CM | POA: Diagnosis not present

## 2017-10-06 DIAGNOSIS — R1084 Generalized abdominal pain: Secondary | ICD-10-CM | POA: Diagnosis not present

## 2017-10-06 DIAGNOSIS — K2980 Duodenitis without bleeding: Secondary | ICD-10-CM | POA: Diagnosis not present

## 2017-10-06 DIAGNOSIS — K5730 Diverticulosis of large intestine without perforation or abscess without bleeding: Secondary | ICD-10-CM | POA: Diagnosis not present

## 2017-10-06 DIAGNOSIS — R634 Abnormal weight loss: Secondary | ICD-10-CM | POA: Diagnosis not present

## 2017-10-06 DIAGNOSIS — R6881 Early satiety: Secondary | ICD-10-CM | POA: Diagnosis not present

## 2017-10-06 DIAGNOSIS — K648 Other hemorrhoids: Secondary | ICD-10-CM | POA: Diagnosis not present

## 2017-10-06 DIAGNOSIS — D126 Benign neoplasm of colon, unspecified: Secondary | ICD-10-CM | POA: Diagnosis not present

## 2017-10-07 ENCOUNTER — Other Ambulatory Visit: Payer: Self-pay | Admitting: Gastroenterology

## 2017-10-07 DIAGNOSIS — R630 Anorexia: Secondary | ICD-10-CM

## 2017-10-07 DIAGNOSIS — R109 Unspecified abdominal pain: Secondary | ICD-10-CM

## 2017-10-07 DIAGNOSIS — R634 Abnormal weight loss: Secondary | ICD-10-CM

## 2017-10-12 DIAGNOSIS — D126 Benign neoplasm of colon, unspecified: Secondary | ICD-10-CM | POA: Diagnosis not present

## 2017-10-12 DIAGNOSIS — K2980 Duodenitis without bleeding: Secondary | ICD-10-CM | POA: Diagnosis not present

## 2017-10-19 ENCOUNTER — Ambulatory Visit
Admission: RE | Admit: 2017-10-19 | Discharge: 2017-10-19 | Disposition: A | Discharge status: Home / Self Care | Payer: Medicare Other | Source: Ambulatory Visit | Attending: Gastroenterology | Admitting: Gastroenterology

## 2017-10-19 DIAGNOSIS — R109 Unspecified abdominal pain: Secondary | ICD-10-CM

## 2017-10-19 DIAGNOSIS — R630 Anorexia: Secondary | ICD-10-CM

## 2017-10-19 DIAGNOSIS — K429 Umbilical hernia without obstruction or gangrene: Secondary | ICD-10-CM | POA: Diagnosis not present

## 2017-10-19 DIAGNOSIS — R634 Abnormal weight loss: Secondary | ICD-10-CM

## 2017-10-19 MED ORDER — IOPAMIDOL (ISOVUE-300) INJECTION 61%
100.0000 mL | Freq: Once | INTRAVENOUS | Status: AC | PRN
  Administered 2017-10-19: 100 mL via INTRAVENOUS

## 2017-11-15 ENCOUNTER — Encounter: Payer: Self-pay | Admitting: Adult Health

## 2017-11-15 ENCOUNTER — Ambulatory Visit (INDEPENDENT_AMBULATORY_CARE_PROVIDER_SITE_OTHER): Payer: Medicare Other | Admitting: Adult Health

## 2017-11-15 VITALS — BP 126/71 | HR 69 | Ht 63.0 in | Wt 136.2 lb

## 2017-11-15 DIAGNOSIS — G441 Vascular headache, not elsewhere classified: Secondary | ICD-10-CM | POA: Diagnosis not present

## 2017-11-15 NOTE — Patient Instructions (Signed)
Your Plan:  Continue using Cymbalta If your symptoms worsen or you develop new symptoms please let us know.   Thank you for coming to see [place] at [facility]. I hope we have been able to provide you high quality care today.  You may receive a patient satisfaction survey over the next few weeks. We would appreciate your feedback and comments so that we may continue to improve ourselves and the health of our patients.

## 2017-11-15 NOTE — Progress Notes (Signed)
PATIENT: Jenny Oliver DOB: 03/26/1946  REASON FOR VISIT: follow up HISTORY FROM: patient  HISTORY OF PRESENT ILLNESS: Today 11/15/17: A 72 year old female with a history of headaches.  She returns today for follow-up.  She states that she has not had a headache in quite some time.  She states that typically when she does get a headache it only lasts 5 to 10 minutes.  Reports that she has not had to use Tylenol for headache.  She states that she continues to see a Restaurant manager, fast food.  She did notice a while back that her headaches resolved when she began seeing a chiropractor.  Reports that she had a fall in June and injured her left foot but has not been evaluated by her primary care provider.  She states that she is planning to see a podiatrist regarding her left foot.  She returns today for evaluation.   HISTORY 11/11/16: Jenny Oliver is a 72 year old right-handed black female with a history of headaches. Over time, her headaches have become less severe and less frequent. She is having anywhere from 5-10 headache days a month, her headaches often times will be present in the morning and last only about 20 minutes. She takes 650 mg tablets of Tylenol, 2 tablets daily. She does not take any other medications for her headache. She has been found to have a low vitamin B12 level, this will be followed through her primary care physician. The patient is having some occasional events of feeling lightheaded, hot. She may have some tightness in the chest. She has not had any overt syncopal events. She has discussed this with her primary care physician. The patient has not been able to tolerate Topamax, and gabapentin. She is on very low-dose Cymbalta taking 20 mg daily. She indicates that this makes her sleepy, but she has not been sleeping well recently.   REVIEW OF SYSTEMS: Out of a complete 14 system review of symptoms, the patient complains only of the following symptoms, and all other reviewed systems are  negative.  Cough, shortness of breath, ringing in ears, fatigue, appetite change, environmental allergies, worries, headache, joint pain, back pain, walking difficulty, neck pain, itching  ALLERGIES: Allergies  Allergen Reactions  . Crestor [Rosuvastatin]   . Lipitor [Atorvastatin]   . Milk-Related Compounds   . Other     Bread, peanuts/tree nuts  . Zetia [Ezetimibe]     HOME MEDICATIONS: Outpatient Medications Prior to Visit  Medication Sig Dispense Refill  . acetaminophen (TYLENOL) 650 MG CR tablet Take 1,300 mg by mouth daily.     Marland Kitchen aMILoride (MIDAMOR) 5 MG tablet Take 5 mg by mouth daily.    Marland Kitchen ascorbic acid (VITAMIN C) 1000 MG tablet Take 1,000 mg by mouth 2 (two) times daily.    . Calcium Carb-Cholecalciferol (CALCIUM 1000 + D PO) Take 1 capsule by mouth daily.     . Cholecalciferol (VITAMIN D3) 2000 units TABS Take 1 tablet by mouth daily.    . Coenzyme Q10 (CO Q 10) 100 MG CAPS Take 1 capsule by mouth daily.     . Collagen 500 MG CAPS Take 2 capsules by mouth daily.     . DULoxetine (CYMBALTA) 20 MG capsule Take 1 capsule (20 mg total) by mouth daily. 30 capsule 11  . Glucosamine HCl-MSM 750-750 MG TABS Take 1 tablet by mouth 2 (two) times daily.    . Magnesium 500 MG TABS Take 1 tablet by mouth daily.    . NONFORMULARY OR  COMPOUNDED [place] compound:  Antiinflammatory Cream - Diclofenac 3%, Baclofen 2%, Cyclobenzaprine 2%, Lidocaine 2%, apply 1-2 grams to affected area 3-4 times daily. 120 each 3  . Potassium 99 MG TABS Take 1 tablet by mouth daily.     . pravastatin (PRAVACHOL) 80 MG tablet Take 40 mg by mouth daily.     . Probiotic Product (PROBIOTIC DAILY PO) Take by mouth 2 (two) times daily.     No facility-administered medications prior to visit.     PAST MEDICAL HISTORY: Past Medical History:  Diagnosis Date  . Benign paroxysmal positional vertigo 04/24/2015  . Chronic insomnia 04/24/2015  . Chronic kidney disease   . COPD (chronic obstructive  pulmonary disease) ([place])   . Gait disorder 11/06/2014  . GERD (gastroesophageal reflux disease)   . Headache 08/27/2015  . Liddle's syndrome   . OA (osteoarthritis)   . Sciatica   . Vitamin B12 deficiency   . Vitamin D deficiency     PAST SURGICAL HISTORY: Past Surgical History:  Procedure Laterality Date  . BALLOON DILATION  11/03/2011   Procedure: BALLOON DILATION;  Surgeon: Arta Silence, MD;  Location: [facility];  Service: Endoscopy;  Laterality: N/A;  . PARTIAL HYSTERECTOMY  1994  . TONSILLECTOMY      FAMILY HISTORY: Family History  Problem Relation Age of Onset  . Heart disease Mother   . Hyperlipidemia Mother   . Cancer Father        stomach  . Healthy Brother     SOCIAL HISTORY: Social History   Socioeconomic History  . Marital status: Divorced    Spouse name: Not on file  . Number of children: 1  . Years of education: graduate  . Highest education level: Not on file  Occupational History  . Occupation: retired  Scientific laboratory technician  . Financial resource strain: Not on file  . Food insecurity:    Worry: Not on file    Inability: Not on file  . Transportation needs:    Medical: Not on file    Non-medical: Not on file  Tobacco Use  . Smoking status: Current Every Day Smoker    Packs/day: 1.00    Years: 48.00    Pack years: 48.00  . Smokeless tobacco: Never Used  Substance and Sexual Activity  . Alcohol use: No  . Drug use: No  . Sexual activity: Not on file  Lifestyle  . Physical activity:    Days per week: Not on file    Minutes per session: Not on file  . Stress: Not on file  Relationships  . Social connections:    Talks on phone: Not on file    Gets together: Not on file    Attends religious service: Not on file    Active member of club or organization: Not on file    Attends meetings of clubs or organizations: Not on file    Relationship status: Not on file  . Intimate partner violence:    Fear of current or ex partner: Not on file     Emotionally abused: Not on file    Physically abused: Not on file    Forced sexual activity: Not on file  Other Topics Concern  . Not on file  Social History Narrative   Patient drinks about 2 cups of caffeine daily.   Patient is right handed.      PHYSICAL EXAM  Vitals:   11/15/17 1331  BP: 126/71  Pulse: 69  Weight: 136 lb  3.2 oz (61.8 kg)  Height: 5\' 3"  (1.6 m)   Body mass index is 24.13 kg/m.  Generalized: Well developed, in no acute distress   Neurological examination  Mentation: Alert oriented to time, place, history taking. Follows all commands speech and language fluent Cranial nerve II-XII: Pupils were equal round reactive to light. Extraocular movements were full, visual field were full on confrontational test. Facial sensation and strength were normal. Uvula tongue midline. Head turning and shoulder shrug  were normal and symmetric. Motor: The motor testing reveals 5 over 5 strength of all 4 extremities. Good symmetric motor tone is noted throughout.  Sensory: Sensory testing is intact to soft touch on all 4 extremities. No evidence of extinction is noted.  Coordination: Cerebellar testing reveals good finger-nose-finger and heel-to-shin bilaterally.  Gait and station: Gait is normal. Tandem gait is normal. Romberg is negative. No drift is seen.  Reflexes: Deep tendon reflexes are symmetric and normal bilaterally.   DIAGNOSTIC DATA (LABS, IMAGING, TESTING) - I reviewed patient records, labs, notes, testing and imaging myself where available.  Lab Results  Component Value Date   WBC 4.7 09/06/2013   HGB 15.0 09/06/2013   HCT 42.5 09/06/2013   MCV 95.7 09/06/2013   PLT 317 09/06/2013      Component Value Date/Time   NA 140 09/06/2013 1158   K 4.9 09/06/2013 1158   CL 103 09/06/2013 1158   CO2 28 09/06/2013 1158   GLUCOSE 96 09/06/2013 1158   BUN 14 09/06/2013 1158   CREATININE 0.86 09/06/2013 1158   CALCIUM 10.4 09/06/2013 1158   PROT 6.8 09/06/2013  1158   ALBUMIN 4.7 09/06/2013 1158   AST 18 09/06/2013 1158   ALT 21 09/06/2013 1158   ALKPHOS 67 09/06/2013 1158   BILITOT 1.0 09/06/2013 1158    Lab Results  Component Value Date   TSH 0.766 09/06/2013      ASSESSMENT AND PLAN 72 y.o. year old female  has a past medical history of Benign paroxysmal positional vertigo (04/24/2015), Chronic insomnia (04/24/2015), Chronic kidney disease, COPD (chronic obstructive pulmonary disease) ([place]), Gait disorder (11/06/2014), GERD (gastroesophageal reflux disease), Headache (08/27/2015), Liddle's syndrome, OA (osteoarthritis), Sciatica, Vitamin B12 deficiency, and Vitamin D deficiency. here with:  1.  Headache  The patient has not had a headache in quite some time.  She reports that she is only been taking Cymbalta when she gets a headache.  I advised that Cymbalta is not intended to be used that way.  She reports that she was unable to use it daily due to drowsiness.  She can discontinue this medication.  If her headache frequency increases she should let us know.  She will follow-up if needed.   I spent 15 minutes with the patient. 50% of this time was spent discussing her headache frequency   Ward Givens, MSN, NP-C 11/15/2017, 1:41 PM [facility] [address] [place],  [address] [phone]

## 2017-11-16 DIAGNOSIS — M9903 Segmental and somatic dysfunction of lumbar region: Secondary | ICD-10-CM | POA: Diagnosis not present

## 2017-11-16 DIAGNOSIS — M5136 Other intervertebral disc degeneration, lumbar region: Secondary | ICD-10-CM | POA: Diagnosis not present

## 2017-11-16 DIAGNOSIS — M9904 Segmental and somatic dysfunction of sacral region: Secondary | ICD-10-CM | POA: Diagnosis not present

## 2017-11-16 DIAGNOSIS — M9905 Segmental and somatic dysfunction of pelvic region: Secondary | ICD-10-CM | POA: Diagnosis not present

## 2017-11-24 DIAGNOSIS — M9905 Segmental and somatic dysfunction of pelvic region: Secondary | ICD-10-CM | POA: Diagnosis not present

## 2017-11-24 DIAGNOSIS — M5136 Other intervertebral disc degeneration, lumbar region: Secondary | ICD-10-CM | POA: Diagnosis not present

## 2017-11-24 DIAGNOSIS — M9904 Segmental and somatic dysfunction of sacral region: Secondary | ICD-10-CM | POA: Diagnosis not present

## 2017-11-24 DIAGNOSIS — M9903 Segmental and somatic dysfunction of lumbar region: Secondary | ICD-10-CM | POA: Diagnosis not present

## 2017-12-05 DIAGNOSIS — E559 Vitamin D deficiency, unspecified: Secondary | ICD-10-CM | POA: Diagnosis not present

## 2017-12-05 DIAGNOSIS — K219 Gastro-esophageal reflux disease without esophagitis: Secondary | ICD-10-CM | POA: Diagnosis not present

## 2017-12-05 DIAGNOSIS — N390 Urinary tract infection, site not specified: Secondary | ICD-10-CM | POA: Diagnosis not present

## 2017-12-05 DIAGNOSIS — E785 Hyperlipidemia, unspecified: Secondary | ICD-10-CM | POA: Diagnosis not present

## 2017-12-08 DIAGNOSIS — Z86010 Personal history of colonic polyps: Secondary | ICD-10-CM | POA: Diagnosis not present

## 2017-12-08 DIAGNOSIS — R1084 Generalized abdominal pain: Secondary | ICD-10-CM | POA: Diagnosis not present

## 2017-12-08 DIAGNOSIS — R634 Abnormal weight loss: Secondary | ICD-10-CM | POA: Diagnosis not present

## 2017-12-13 DIAGNOSIS — M8580 Other specified disorders of bone density and structure, unspecified site: Secondary | ICD-10-CM | POA: Diagnosis not present

## 2017-12-13 DIAGNOSIS — N182 Chronic kidney disease, stage 2 (mild): Secondary | ICD-10-CM | POA: Diagnosis not present

## 2017-12-13 DIAGNOSIS — F5104 Psychophysiologic insomnia: Secondary | ICD-10-CM | POA: Diagnosis not present

## 2017-12-13 DIAGNOSIS — F17210 Nicotine dependence, cigarettes, uncomplicated: Secondary | ICD-10-CM | POA: Diagnosis not present

## 2017-12-13 DIAGNOSIS — J309 Allergic rhinitis, unspecified: Secondary | ICD-10-CM | POA: Diagnosis not present

## 2017-12-13 DIAGNOSIS — J449 Chronic obstructive pulmonary disease, unspecified: Secondary | ICD-10-CM | POA: Diagnosis not present

## 2017-12-13 DIAGNOSIS — K219 Gastro-esophageal reflux disease without esophagitis: Secondary | ICD-10-CM | POA: Diagnosis not present

## 2017-12-13 DIAGNOSIS — E538 Deficiency of other specified B group vitamins: Secondary | ICD-10-CM | POA: Diagnosis not present

## 2017-12-13 DIAGNOSIS — E785 Hyperlipidemia, unspecified: Secondary | ICD-10-CM | POA: Diagnosis not present

## 2017-12-13 DIAGNOSIS — I152 Hypertension secondary to endocrine disorders: Secondary | ICD-10-CM | POA: Diagnosis not present

## 2017-12-13 DIAGNOSIS — J441 Chronic obstructive pulmonary disease with (acute) exacerbation: Secondary | ICD-10-CM | POA: Diagnosis not present

## 2018-01-04 DIAGNOSIS — B079 Viral wart, unspecified: Secondary | ICD-10-CM | POA: Diagnosis not present

## 2018-01-04 DIAGNOSIS — M79671 Pain in right foot: Secondary | ICD-10-CM | POA: Diagnosis not present

## 2018-01-04 DIAGNOSIS — M9904 Segmental and somatic dysfunction of sacral region: Secondary | ICD-10-CM | POA: Diagnosis not present

## 2018-01-04 DIAGNOSIS — M21621 Bunionette of right foot: Secondary | ICD-10-CM | POA: Diagnosis not present

## 2018-01-04 DIAGNOSIS — M9905 Segmental and somatic dysfunction of pelvic region: Secondary | ICD-10-CM | POA: Diagnosis not present

## 2018-01-04 DIAGNOSIS — M5136 Other intervertebral disc degeneration, lumbar region: Secondary | ICD-10-CM | POA: Diagnosis not present

## 2018-01-04 DIAGNOSIS — M79672 Pain in left foot: Secondary | ICD-10-CM | POA: Diagnosis not present

## 2018-01-04 DIAGNOSIS — M21622 Bunionette of left foot: Secondary | ICD-10-CM | POA: Diagnosis not present

## 2018-01-04 DIAGNOSIS — M9903 Segmental and somatic dysfunction of lumbar region: Secondary | ICD-10-CM | POA: Diagnosis not present

## 2018-01-11 DIAGNOSIS — R7303 Prediabetes: Secondary | ICD-10-CM | POA: Diagnosis not present

## 2018-01-12 ENCOUNTER — Ambulatory Visit (INDEPENDENT_AMBULATORY_CARE_PROVIDER_SITE_OTHER)
Admission: RE | Admit: 2018-01-12 | Discharge: 2018-01-12 | Disposition: A | Discharge status: Home / Self Care | Payer: Medicare Other | Source: Ambulatory Visit | Attending: Acute Care | Admitting: Acute Care

## 2018-01-12 DIAGNOSIS — Z122 Encounter for screening for malignant neoplasm of respiratory organs: Secondary | ICD-10-CM

## 2018-01-12 DIAGNOSIS — F17210 Nicotine dependence, cigarettes, uncomplicated: Secondary | ICD-10-CM | POA: Diagnosis not present

## 2018-01-16 ENCOUNTER — Other Ambulatory Visit: Payer: Self-pay | Admitting: Acute Care

## 2018-01-16 DIAGNOSIS — Z122 Encounter for screening for malignant neoplasm of respiratory organs: Secondary | ICD-10-CM

## 2018-01-16 DIAGNOSIS — Z87891 Personal history of nicotine dependence: Secondary | ICD-10-CM

## 2018-01-16 DIAGNOSIS — F17210 Nicotine dependence, cigarettes, uncomplicated: Principal | ICD-10-CM

## 2018-01-17 DIAGNOSIS — I151 Hypertension secondary to other renal disorders: Secondary | ICD-10-CM | POA: Diagnosis not present

## 2018-01-17 DIAGNOSIS — M810 Age-related osteoporosis without current pathological fracture: Secondary | ICD-10-CM | POA: Diagnosis not present

## 2018-01-17 DIAGNOSIS — M8000XG Age-related osteoporosis with current pathological fracture, unspecified site, subsequent encounter for fracture with delayed healing: Secondary | ICD-10-CM | POA: Diagnosis not present

## 2018-01-18 DIAGNOSIS — D239 Other benign neoplasm of skin, unspecified: Secondary | ICD-10-CM | POA: Diagnosis not present

## 2018-01-18 DIAGNOSIS — D485 Neoplasm of uncertain behavior of skin: Secondary | ICD-10-CM | POA: Diagnosis not present

## 2018-01-18 DIAGNOSIS — M25571 Pain in right ankle and joints of right foot: Secondary | ICD-10-CM | POA: Diagnosis not present

## 2018-02-02 DIAGNOSIS — M25571 Pain in right ankle and joints of right foot: Secondary | ICD-10-CM | POA: Diagnosis not present

## 2018-02-07 DIAGNOSIS — J449 Chronic obstructive pulmonary disease, unspecified: Secondary | ICD-10-CM | POA: Diagnosis not present

## 2018-02-07 DIAGNOSIS — M8580 Other specified disorders of bone density and structure, unspecified site: Secondary | ICD-10-CM | POA: Diagnosis not present

## 2018-02-07 DIAGNOSIS — I151 Hypertension secondary to other renal disorders: Secondary | ICD-10-CM | POA: Diagnosis not present

## 2018-02-07 DIAGNOSIS — E7800 Pure hypercholesterolemia, unspecified: Secondary | ICD-10-CM | POA: Diagnosis not present

## 2018-02-07 DIAGNOSIS — F17210 Nicotine dependence, cigarettes, uncomplicated: Secondary | ICD-10-CM | POA: Diagnosis not present

## 2018-03-08 DIAGNOSIS — R600 Localized edema: Secondary | ICD-10-CM | POA: Diagnosis not present

## 2018-03-13 DIAGNOSIS — F17210 Nicotine dependence, cigarettes, uncomplicated: Secondary | ICD-10-CM | POA: Diagnosis not present

## 2018-03-13 DIAGNOSIS — I151 Hypertension secondary to other renal disorders: Secondary | ICD-10-CM | POA: Diagnosis not present

## 2018-03-13 DIAGNOSIS — M8580 Other specified disorders of bone density and structure, unspecified site: Secondary | ICD-10-CM | POA: Diagnosis not present

## 2018-03-13 DIAGNOSIS — E7800 Pure hypercholesterolemia, unspecified: Secondary | ICD-10-CM | POA: Diagnosis not present

## 2018-03-13 DIAGNOSIS — J449 Chronic obstructive pulmonary disease, unspecified: Secondary | ICD-10-CM | POA: Diagnosis not present

## 2018-03-16 DIAGNOSIS — I152 Hypertension secondary to endocrine disorders: Secondary | ICD-10-CM | POA: Diagnosis not present

## 2018-03-16 DIAGNOSIS — M7989 Other specified soft tissue disorders: Secondary | ICD-10-CM | POA: Diagnosis not present

## 2018-03-16 DIAGNOSIS — R600 Localized edema: Secondary | ICD-10-CM | POA: Diagnosis not present

## 2018-04-04 DIAGNOSIS — I10 Essential (primary) hypertension: Secondary | ICD-10-CM | POA: Diagnosis not present

## 2018-04-04 DIAGNOSIS — R600 Localized edema: Secondary | ICD-10-CM | POA: Diagnosis not present

## 2018-04-04 DIAGNOSIS — I151 Hypertension secondary to other renal disorders: Secondary | ICD-10-CM | POA: Diagnosis not present

## 2018-04-05 ENCOUNTER — Other Ambulatory Visit: Payer: Self-pay | Admitting: Gastroenterology

## 2018-04-05 ENCOUNTER — Ambulatory Visit
Admission: RE | Admit: 2018-04-05 | Discharge: 2018-04-05 | Disposition: A | Discharge status: Home / Self Care | Payer: Medicare Other | Source: Ambulatory Visit | Attending: Gastroenterology | Admitting: Gastroenterology

## 2018-04-05 DIAGNOSIS — K5900 Constipation, unspecified: Secondary | ICD-10-CM | POA: Diagnosis not present

## 2018-04-05 DIAGNOSIS — R109 Unspecified abdominal pain: Secondary | ICD-10-CM

## 2018-04-05 DIAGNOSIS — R1030 Lower abdominal pain, unspecified: Secondary | ICD-10-CM | POA: Diagnosis not present

## 2018-04-05 DIAGNOSIS — K449 Diaphragmatic hernia without obstruction or gangrene: Secondary | ICD-10-CM | POA: Diagnosis not present

## 2018-04-06 DIAGNOSIS — M9904 Segmental and somatic dysfunction of sacral region: Secondary | ICD-10-CM | POA: Diagnosis not present

## 2018-04-06 DIAGNOSIS — I151 Hypertension secondary to other renal disorders: Secondary | ICD-10-CM | POA: Diagnosis not present

## 2018-04-06 DIAGNOSIS — M5136 Other intervertebral disc degeneration, lumbar region: Secondary | ICD-10-CM | POA: Diagnosis not present

## 2018-04-06 DIAGNOSIS — M9905 Segmental and somatic dysfunction of pelvic region: Secondary | ICD-10-CM | POA: Diagnosis not present

## 2018-04-06 DIAGNOSIS — M9903 Segmental and somatic dysfunction of lumbar region: Secondary | ICD-10-CM | POA: Diagnosis not present

## 2018-04-11 DIAGNOSIS — R600 Localized edema: Secondary | ICD-10-CM | POA: Diagnosis not present

## 2018-05-15 DIAGNOSIS — L0390 Cellulitis, unspecified: Secondary | ICD-10-CM | POA: Diagnosis not present

## 2018-05-15 DIAGNOSIS — J449 Chronic obstructive pulmonary disease, unspecified: Secondary | ICD-10-CM | POA: Diagnosis not present

## 2018-05-22 DIAGNOSIS — L0390 Cellulitis, unspecified: Secondary | ICD-10-CM | POA: Diagnosis not present

## 2018-05-22 DIAGNOSIS — J449 Chronic obstructive pulmonary disease, unspecified: Secondary | ICD-10-CM | POA: Diagnosis not present

## 2018-07-17 DIAGNOSIS — R319 Hematuria, unspecified: Secondary | ICD-10-CM | POA: Diagnosis not present

## 2018-07-17 DIAGNOSIS — K219 Gastro-esophageal reflux disease without esophagitis: Secondary | ICD-10-CM | POA: Diagnosis not present

## 2018-07-17 DIAGNOSIS — N182 Chronic kidney disease, stage 2 (mild): Secondary | ICD-10-CM | POA: Diagnosis not present

## 2018-07-17 DIAGNOSIS — E785 Hyperlipidemia, unspecified: Secondary | ICD-10-CM | POA: Diagnosis not present

## 2018-07-17 DIAGNOSIS — I151 Hypertension secondary to other renal disorders: Secondary | ICD-10-CM | POA: Diagnosis not present

## 2018-07-17 DIAGNOSIS — Z720 Tobacco use: Secondary | ICD-10-CM | POA: Diagnosis not present

## 2018-07-17 DIAGNOSIS — K589 Irritable bowel syndrome without diarrhea: Secondary | ICD-10-CM | POA: Diagnosis not present

## 2018-07-17 DIAGNOSIS — I129 Hypertensive chronic kidney disease with stage 1 through stage 4 chronic kidney disease, or unspecified chronic kidney disease: Secondary | ICD-10-CM | POA: Diagnosis not present

## 2018-07-17 DIAGNOSIS — J449 Chronic obstructive pulmonary disease, unspecified: Secondary | ICD-10-CM | POA: Diagnosis not present

## 2018-08-15 DIAGNOSIS — R351 Nocturia: Secondary | ICD-10-CM | POA: Diagnosis not present

## 2018-08-15 DIAGNOSIS — R350 Frequency of micturition: Secondary | ICD-10-CM | POA: Diagnosis not present

## 2018-08-15 DIAGNOSIS — R3121 Asymptomatic microscopic hematuria: Secondary | ICD-10-CM | POA: Diagnosis not present

## 2018-08-22 DIAGNOSIS — R3121 Asymptomatic microscopic hematuria: Secondary | ICD-10-CM | POA: Diagnosis not present

## 2018-08-22 DIAGNOSIS — N201 Calculus of ureter: Secondary | ICD-10-CM | POA: Diagnosis not present

## 2018-08-29 DIAGNOSIS — N201 Calculus of ureter: Secondary | ICD-10-CM | POA: Diagnosis not present

## 2018-08-29 DIAGNOSIS — R3121 Asymptomatic microscopic hematuria: Secondary | ICD-10-CM | POA: Diagnosis not present

## 2018-09-05 DIAGNOSIS — L609 Nail disorder, unspecified: Secondary | ICD-10-CM | POA: Diagnosis not present

## 2018-09-11 ENCOUNTER — Ambulatory Visit (INDEPENDENT_AMBULATORY_CARE_PROVIDER_SITE_OTHER): Payer: Medicare Other

## 2018-09-11 ENCOUNTER — Encounter: Payer: Self-pay | Admitting: Podiatry

## 2018-09-11 ENCOUNTER — Ambulatory Visit (INDEPENDENT_AMBULATORY_CARE_PROVIDER_SITE_OTHER): Payer: Medicare Other | Admitting: Podiatry

## 2018-09-11 ENCOUNTER — Other Ambulatory Visit: Payer: Self-pay

## 2018-09-11 VITALS — Temp 98.0°F

## 2018-09-11 DIAGNOSIS — M2041 Other hammer toe(s) (acquired), right foot: Secondary | ICD-10-CM

## 2018-09-11 DIAGNOSIS — B351 Tinea unguium: Secondary | ICD-10-CM | POA: Diagnosis not present

## 2018-09-11 DIAGNOSIS — M2042 Other hammer toe(s) (acquired), left foot: Secondary | ICD-10-CM

## 2018-09-11 DIAGNOSIS — M79675 Pain in left toe(s): Secondary | ICD-10-CM

## 2018-09-11 DIAGNOSIS — L84 Corns and callosities: Secondary | ICD-10-CM

## 2018-09-11 DIAGNOSIS — M79674 Pain in right toe(s): Secondary | ICD-10-CM

## 2018-09-12 NOTE — Progress Notes (Signed)
Subjective:   Patient ID: Jenny Oliver, female   DOB: 73 y.o.   MRN: [id number]   HPI Patient presents with severe keratotic lesions of both feet digital deformity fifth left with concerns about displacement and nail disease 1-5 both feet that are thickened incurvated and she cannot cut herself   ROS      Objective:  Physical Exam  Neurovascular status intact and unchanged from previous visit with thick yellow brittle nailbeds 1-5 both feet elevated fifth digit left with dislocation at the MPJ and severe keratotic lesion sub-fifth metatarsal both feet that are painful     Assessment:  Significant mycotic nail infection with pain 1-5 both feet lesion formation bilateral and digital deformity fifth left rib viewed all conditions and discussed digital deformity and at this point     Plan:  Consideration for arthroplasty or other procedure but at this point deep debridement of lesions accomplished debridement of nailbeds 1-5 accomplished no iatrogenic bleeding patient will be seen back and we will see if she can get on a regular schedule whether this will keep her pain-free  X-rays dated today did indicate dislocation fifth digit left at the MPJ with arthroplasty that had been done years and years ago by another physician in [place] with the right when showing mild osteoporosis arthritis but no other pathology

## 2018-09-13 DIAGNOSIS — N201 Calculus of ureter: Secondary | ICD-10-CM | POA: Diagnosis not present

## 2018-09-14 DIAGNOSIS — N200 Calculus of kidney: Secondary | ICD-10-CM | POA: Diagnosis not present

## 2018-09-14 DIAGNOSIS — M8580 Other specified disorders of bone density and structure, unspecified site: Secondary | ICD-10-CM | POA: Diagnosis not present

## 2018-09-14 DIAGNOSIS — I151 Hypertension secondary to other renal disorders: Secondary | ICD-10-CM | POA: Diagnosis not present

## 2018-09-14 DIAGNOSIS — F17210 Nicotine dependence, cigarettes, uncomplicated: Secondary | ICD-10-CM | POA: Diagnosis not present

## 2018-09-14 DIAGNOSIS — J449 Chronic obstructive pulmonary disease, unspecified: Secondary | ICD-10-CM | POA: Diagnosis not present

## 2018-09-14 DIAGNOSIS — E7800 Pure hypercholesterolemia, unspecified: Secondary | ICD-10-CM | POA: Diagnosis not present

## 2018-09-18 DIAGNOSIS — I151 Hypertension secondary to other renal disorders: Secondary | ICD-10-CM | POA: Diagnosis not present

## 2018-09-18 DIAGNOSIS — N200 Calculus of kidney: Secondary | ICD-10-CM | POA: Diagnosis not present

## 2018-09-18 DIAGNOSIS — Z79899 Other long term (current) drug therapy: Secondary | ICD-10-CM | POA: Diagnosis not present

## 2018-09-18 DIAGNOSIS — M8580 Other specified disorders of bone density and structure, unspecified site: Secondary | ICD-10-CM | POA: Diagnosis not present

## 2018-09-19 ENCOUNTER — Telehealth: Payer: Self-pay | Admitting: Neurology

## 2018-09-19 DIAGNOSIS — D329 Benign neoplasm of meninges, unspecified: Secondary | ICD-10-CM

## 2018-09-19 NOTE — Telephone Encounter (Signed)
I called the patient.  I have recommended a follow-up MRI to look at the growth of the meningioma, last study was done 3 years ago.  The patient is not sure she wants the MRI at this time, whenever she decides that she wants to have an MRI for follow-up, she is to contact our office.

## 2018-09-22 NOTE — Telephone Encounter (Signed)
Pt has called to say she is ready to move forward with the follow up MRI

## 2018-09-24 NOTE — Telephone Encounter (Signed)
I will order the MRI.

## 2018-09-24 NOTE — Addendum Note (Signed)
Addended by: Kathrynn Ducking on: 09/24/2018 07:28 AM   Modules accepted: Orders

## 2018-09-25 ENCOUNTER — Telehealth: Payer: Self-pay | Admitting: Neurology

## 2018-09-25 NOTE — Telephone Encounter (Signed)
Medicare/unicare order sent to GI. No auth they will reach out to the patient to schedule.  

## 2018-09-27 DIAGNOSIS — N23 Unspecified renal colic: Secondary | ICD-10-CM | POA: Diagnosis not present

## 2018-09-27 DIAGNOSIS — N201 Calculus of ureter: Secondary | ICD-10-CM | POA: Diagnosis not present

## 2018-09-28 ENCOUNTER — Other Ambulatory Visit: Payer: Self-pay | Admitting: Urology

## 2018-10-04 DIAGNOSIS — R2231 Localized swelling, mass and lump, right upper limb: Secondary | ICD-10-CM | POA: Diagnosis not present

## 2018-10-04 DIAGNOSIS — M79642 Pain in left hand: Secondary | ICD-10-CM | POA: Diagnosis not present

## 2018-10-04 DIAGNOSIS — L608 Other nail disorders: Secondary | ICD-10-CM | POA: Insufficient documentation

## 2018-10-04 DIAGNOSIS — M79641 Pain in right hand: Secondary | ICD-10-CM | POA: Insufficient documentation

## 2018-10-04 DIAGNOSIS — S66812A Strain of other specified muscles, fascia and tendons at wrist and hand level, left hand, initial encounter: Secondary | ICD-10-CM | POA: Diagnosis not present

## 2018-10-05 ENCOUNTER — Encounter (HOSPITAL_BASED_OUTPATIENT_CLINIC_OR_DEPARTMENT_OTHER): Payer: Self-pay | Admitting: *Deleted

## 2018-10-05 ENCOUNTER — Other Ambulatory Visit: Payer: Self-pay

## 2018-10-05 ENCOUNTER — Other Ambulatory Visit (HOSPITAL_COMMUNITY)
Admission: RE | Admit: 2018-10-05 | Discharge: 2018-10-05 | Disposition: A | Discharge status: Home / Self Care | Payer: Medicare Other | Source: Ambulatory Visit | Attending: Urology | Admitting: Urology

## 2018-10-05 DIAGNOSIS — Z1159 Encounter for screening for other viral diseases: Secondary | ICD-10-CM | POA: Insufficient documentation

## 2018-10-05 DIAGNOSIS — Z01812 Encounter for preprocedural laboratory examination: Secondary | ICD-10-CM | POA: Insufficient documentation

## 2018-10-05 NOTE — Progress Notes (Addendum)
Spoke w/ pt via phone for pre-op interview.  Npo after mn.  Arrive at 1015.  Need istat 8 and ekg.  Pt had covid test done today.  Current chest CT in chart and epic.

## 2018-10-05 NOTE — Progress Notes (Signed)

## 2018-10-06 LAB — SARS CORONAVIRUS 2 (TAT 6-24 HRS): SARS Coronavirus 2: NEGATIVE

## 2018-10-09 ENCOUNTER — Ambulatory Visit (HOSPITAL_BASED_OUTPATIENT_CLINIC_OR_DEPARTMENT_OTHER): Payer: Medicare Other | Admitting: Certified Registered"

## 2018-10-09 ENCOUNTER — Ambulatory Visit (HOSPITAL_BASED_OUTPATIENT_CLINIC_OR_DEPARTMENT_OTHER)
Admission: RE | Admit: 2018-10-09 | Discharge: 2018-10-09 | Disposition: A | Discharge status: Home / Self Care | Payer: Medicare Other | Attending: Urology | Admitting: Urology

## 2018-10-09 ENCOUNTER — Encounter (HOSPITAL_BASED_OUTPATIENT_CLINIC_OR_DEPARTMENT_OTHER)
Admission: RE | Disposition: A | Discharge status: Home / Self Care | Payer: Self-pay | Source: Home / Self Care | Attending: Urology

## 2018-10-09 ENCOUNTER — Encounter (HOSPITAL_BASED_OUTPATIENT_CLINIC_OR_DEPARTMENT_OTHER): Payer: Self-pay

## 2018-10-09 ENCOUNTER — Other Ambulatory Visit: Payer: Self-pay

## 2018-10-09 DIAGNOSIS — Z90710 Acquired absence of both cervix and uterus: Secondary | ICD-10-CM | POA: Insufficient documentation

## 2018-10-09 DIAGNOSIS — R3129 Other microscopic hematuria: Secondary | ICD-10-CM | POA: Diagnosis present

## 2018-10-09 DIAGNOSIS — N23 Unspecified renal colic: Secondary | ICD-10-CM | POA: Insufficient documentation

## 2018-10-09 DIAGNOSIS — Z79899 Other long term (current) drug therapy: Secondary | ICD-10-CM | POA: Insufficient documentation

## 2018-10-09 DIAGNOSIS — N201 Calculus of ureter: Secondary | ICD-10-CM | POA: Diagnosis not present

## 2018-10-09 DIAGNOSIS — K219 Gastro-esophageal reflux disease without esophagitis: Secondary | ICD-10-CM | POA: Diagnosis not present

## 2018-10-09 HISTORY — DX: Simple chronic bronchitis: J41.0

## 2018-10-09 HISTORY — DX: Personal history of other infectious and parasitic diseases: Z86.19

## 2018-10-09 HISTORY — DX: Diaphragmatic hernia without obstruction or gangrene: K44.9

## 2018-10-09 HISTORY — DX: Other intervertebral disc degeneration, lumbosacral region: M51.37

## 2018-10-09 HISTORY — PX: CYSTOSCOPY WITH URETEROSCOPY AND STENT PLACEMENT: SHX6377

## 2018-10-09 HISTORY — DX: Unspecified abnormalities of gait and mobility: R26.9

## 2018-10-09 HISTORY — DX: Other intervertebral disc degeneration, lumbosacral region without mention of lumbar back pain or lower extremity pain: M51.379

## 2018-10-09 HISTORY — DX: Complete loss of teeth, unspecified cause, unspecified class: K08.109

## 2018-10-09 HISTORY — DX: Calculus of ureter: N20.1

## 2018-10-09 HISTORY — DX: Localized edema: R60.0

## 2018-10-09 HISTORY — DX: Emphysema, unspecified: J43.9

## 2018-10-09 HISTORY — DX: Irritable bowel syndrome, unspecified: K58.9

## 2018-10-09 HISTORY — DX: Hypertensive chronic kidney disease with stage 1 through stage 4 chronic kidney disease, or unspecified chronic kidney disease: I12.9

## 2018-10-09 HISTORY — DX: Presence of dental prosthetic device (complete) (partial): Z97.2

## 2018-10-09 HISTORY — DX: Benign neoplasm of meninges, unspecified: D32.9

## 2018-10-09 HISTORY — DX: Other specified postprocedural states: Z98.890

## 2018-10-09 LAB — POCT I-STAT, CHEM 8
BUN: 9 mg/dL (ref 8–23)
Calcium, Ion: 1.3 mmol/L (ref 1.15–1.40)
Chloride: 106 mmol/L (ref 98–111)
Creatinine, Ser: 0.7 mg/dL (ref 0.44–1.00)
Glucose, Bld: 108 mg/dL — ABNORMAL HIGH (ref 70–99)
HCT: 39 % (ref 36.0–46.0)
Hemoglobin: 13.3 g/dL (ref 12.0–15.0)
Potassium: 3.2 mmol/L — ABNORMAL LOW (ref 3.5–5.1)
Sodium: 144 mmol/L (ref 135–145)
TCO2: 26 mmol/L (ref 22–32)

## 2018-10-09 SURGERY — CYSTOURETEROSCOPY, WITH STENT INSERTION
Anesthesia: General | Site: Renal | Laterality: Right

## 2018-10-09 MED ORDER — IOHEXOL 300 MG/ML  SOLN
INTRAMUSCULAR | Status: DC | PRN
  Administered 2018-10-09: 10 mL

## 2018-10-09 MED ORDER — ONDANSETRON HCL 4 MG/2ML IJ SOLN
INTRAMUSCULAR | Status: DC | PRN
  Administered 2018-10-09: 4 mg via INTRAVENOUS

## 2018-10-09 MED ORDER — PROPOFOL 10 MG/ML IV BOLUS
INTRAVENOUS | Status: DC | PRN
  Administered 2018-10-09: 100 mg via INTRAVENOUS

## 2018-10-09 MED ORDER — KETOROLAC TROMETHAMINE 30 MG/ML IJ SOLN
INTRAMUSCULAR | Status: DC | PRN
  Administered 2018-10-09: 30 mg via INTRAVENOUS

## 2018-10-09 MED ORDER — LACTATED RINGERS IV SOLN
INTRAVENOUS | Status: DC
  Administered 2018-10-09: 50 mL/h via INTRAVENOUS
  Filled 2018-10-09: qty 1000

## 2018-10-09 MED ORDER — KETOROLAC TROMETHAMINE 30 MG/ML IJ SOLN
INTRAMUSCULAR | Status: AC
  Filled 2018-10-09: qty 1

## 2018-10-09 MED ORDER — LIDOCAINE 2% (20 MG/ML) 5 ML SYRINGE
INTRAMUSCULAR | Status: DC | PRN
  Administered 2018-10-09: 40 mg via INTRAVENOUS

## 2018-10-09 MED ORDER — OXYCODONE HCL 5 MG PO TABS
5.0000 mg | ORAL_TABLET | Freq: Once | ORAL | Status: DC | PRN
  Filled 2018-10-09: qty 1

## 2018-10-09 MED ORDER — HYDROCODONE-ACETAMINOPHEN 5-325 MG PO TABS: 1.0000 | ORAL_TABLET | ORAL | 0 refills | Status: DC | PRN

## 2018-10-09 MED ORDER — DEXAMETHASONE SODIUM PHOSPHATE 10 MG/ML IJ SOLN
INTRAMUSCULAR | Status: DC | PRN
  Administered 2018-10-09: 4 mg via INTRAVENOUS

## 2018-10-09 MED ORDER — PROMETHAZINE HCL 25 MG/ML IJ SOLN
6.2500 mg | INTRAMUSCULAR | Status: DC | PRN
  Filled 2018-10-09: qty 1

## 2018-10-09 MED ORDER — EPHEDRINE 5 MG/ML INJ
INTRAVENOUS | Status: AC
  Filled 2018-10-09: qty 10

## 2018-10-09 MED ORDER — LIDOCAINE 2% (20 MG/ML) 5 ML SYRINGE
INTRAMUSCULAR | Status: AC
  Filled 2018-10-09: qty 5

## 2018-10-09 MED ORDER — OXYCODONE HCL 5 MG/5ML PO SOLN
5.0000 mg | Freq: Once | ORAL | Status: DC | PRN
  Filled 2018-10-09: qty 5

## 2018-10-09 MED ORDER — TAMSULOSIN HCL 0.4 MG PO CAPS: 0.4000 mg | ORAL_CAPSULE | Freq: Every day | ORAL | 3 refills | Status: DC

## 2018-10-09 MED ORDER — FENTANYL CITRATE (PF) 100 MCG/2ML IJ SOLN
INTRAMUSCULAR | Status: DC | PRN
  Administered 2018-10-09: 25 ug via INTRAVENOUS
  Administered 2018-10-09: 50 ug via INTRAVENOUS
  Administered 2018-10-09: 25 ug via INTRAVENOUS

## 2018-10-09 MED ORDER — CEFAZOLIN SODIUM-DEXTROSE 2-4 GM/100ML-% IV SOLN
2.0000 g | INTRAVENOUS | Status: AC
  Administered 2018-10-09: 12:00:00 2 g via INTRAVENOUS
  Filled 2018-10-09: qty 100

## 2018-10-09 MED ORDER — ONDANSETRON HCL 4 MG/2ML IJ SOLN
INTRAMUSCULAR | Status: AC
  Filled 2018-10-09: qty 2

## 2018-10-09 MED ORDER — PROPOFOL 10 MG/ML IV BOLUS
INTRAVENOUS | Status: AC
  Filled 2018-10-09: qty 20

## 2018-10-09 MED ORDER — DEXAMETHASONE SODIUM PHOSPHATE 10 MG/ML IJ SOLN
INTRAMUSCULAR | Status: AC
  Filled 2018-10-09: qty 1

## 2018-10-09 MED ORDER — FENTANYL CITRATE (PF) 100 MCG/2ML IJ SOLN
INTRAMUSCULAR | Status: AC
  Filled 2018-10-09: qty 2

## 2018-10-09 MED ORDER — HYDROMORPHONE HCL 1 MG/ML IJ SOLN
0.2500 mg | INTRAMUSCULAR | Status: DC | PRN
  Filled 2018-10-09: qty 0.5

## 2018-10-09 MED ORDER — SODIUM CHLORIDE 0.9 % IR SOLN
Status: DC | PRN
  Administered 2018-10-09: 3000 mL

## 2018-10-09 MED ORDER — CEFAZOLIN SODIUM-DEXTROSE 2-4 GM/100ML-% IV SOLN
INTRAVENOUS | Status: AC
  Filled 2018-10-09: qty 100

## 2018-10-09 SURGICAL SUPPLY — 23 items
BAG DRAIN URO-CYSTO SKYTR STRL (DRAIN) ×3 IMPLANT
BASKET LASER NITINOL 1.9FR (BASKET) ×3 IMPLANT
CATH INTERMIT  6FR 70CM (CATHETERS) IMPLANT
CLOTH BEACON ORANGE TIMEOUT ST (SAFETY) ×3 IMPLANT
FIBER LASER FLEXIVA 365 (UROLOGICAL SUPPLIES) IMPLANT
FIBER LASER TRAC TIP (UROLOGICAL SUPPLIES) ×3 IMPLANT
GLOVE BIO SURGEON STRL SZ7.5 (GLOVE) ×3 IMPLANT
GLOVE BIOGEL PI IND STRL 6.5 (GLOVE) ×2 IMPLANT
GLOVE BIOGEL PI INDICATOR 6.5 (GLOVE) ×4
GLOVE SURG SYN 6.5 ES PF (GLOVE) ×3 IMPLANT
GOWN STRL REUS W/TWL XL LVL3 (GOWN DISPOSABLE) IMPLANT
GUIDEWIRE STR DUAL SENSOR (WIRE) ×3 IMPLANT
IV NS 1000ML (IV SOLUTION) ×2
IV NS 1000ML BAXH (IV SOLUTION) ×1 IMPLANT
IV NS IRRIG 3000ML ARTHROMATIC (IV SOLUTION) ×3 IMPLANT
KIT TURNOVER CYSTO (KITS) ×3 IMPLANT
MANIFOLD NEPTUNE II (INSTRUMENTS) ×3 IMPLANT
NS IRRIG 500ML POUR BTL (IV SOLUTION) ×3 IMPLANT
PACK CYSTO (CUSTOM PROCEDURE TRAY) ×3 IMPLANT
STENT URET 6FRX26 CONTOUR (STENTS) ×3 IMPLANT
TUBE CONNECTING 12'X1/4 (SUCTIONS)
TUBE CONNECTING 12X1/4 (SUCTIONS) IMPLANT
TUBING UROLOGY SET (TUBING) IMPLANT

## 2018-10-09 NOTE — H&P (Signed)
CC/HPI: 08/15/18: I was consulted by the above provider to assess the patient's microscopic hematuria. She has a smoking history. No aspirin or blood thinner   She is continent voids every 2 hours and gets up once a night   She denies a history kidney stones. GU surgery bladder infections. No neurologic issues. She has had a hysterectomy. Bowel function normal. Presentation on medically treated   Cystoscopy normal.    08/29/18: She was initially seen on 5/19 by Dr. Matilde Sprang with history noted above. She underwent CT imaging for further evaluation of microscopic hematuria. She was noted to have an approximatly 6 mm right distal ureteral calculus without hydronephrosis noted. Full impression is noted below. 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 She returns today for follow up. She denies seeing a stone pass in the interim. She does complain of some intermittent RLQ pain. She describes the pain as dull and throbbing. She will use Tylenol when it becomes more bothersome, which does help. She denies any signficant changes in voiding symptoms. She denies gross hematuria, fevers, chills, nausea, or vomiting.    IMPRESSION:  1. 6 mm calculus in the distal third of the right ureter, without  proximal hydroureteronephrosis to indicate significant urinary tract  obstruction at this time.  2. Aortic atherosclerosis.  3. Moderate-sized hiatal hernia.  4. Mild cardiomegaly.    09/13/18: She returns today for follow up. She denies seeing a stone pass in the interim. She continues to have some intermittent, mild pain in her RLQ and groin. She typically will use Tylenol once daily, which is beneficial. She denies exacerbation of voiding symptoms. She denies dysuria, gross hematuria, fevers, nausea, or vomiting. She will have chills on occasion. She was hesitant to use alpha blocker  therapy due to her hx of Liddle syndrome.   09/27/2018  Patient was referred to me to discuss ureteroscopy versus ESWL. She continues to have occasional right-sided flank pain radiating to her groin. She has not seen a stone pass. She denies fever, chill, nausea, vomiting.     ALLERGIES: None   MEDICATIONS: Amiloride Hcl  Collagen  Furosemide  Magnesium  Tylenol  Vitamin D3     GU PSH: Cystoscopy - 08/15/2018 Locm 300-399Mg /Ml Iodine,1Ml - 08/22/2018     NON-GU PSH: None   GU PMH: Ureteral calculus - 08/29/2018 Microscopic hematuria - 08/15/2018 Nocturia - 08/15/2018 Urinary Frequency - 08/15/2018    NON-GU PMH: None   FAMILY HISTORY: None   SOCIAL HISTORY: Marital Status: Divorced    REVIEW OF SYSTEMS:    GU Review Female:   Patient reports trouble starting your stream. Patient denies frequent urination, hard to postpone urination, burning /pain with urination, get up at night to urinate, leakage of urine, stream starts and stops, have to strain to urinate, and being pregnant.  Gastrointestinal (Upper):   Patient denies nausea, vomiting, and indigestion/ heartburn.  Gastrointestinal (Lower):   Patient denies diarrhea and constipation.  Constitutional:   Patient denies fever, night sweats, weight loss, and fatigue.  Skin:   Patient denies skin rash/ lesion and itching.  Eyes:   Patient denies blurred vision and double vision.  Ears/ Nose/ Throat:   Patient denies sore throat and sinus problems.  Hematologic/Lymphatic:   Patient denies swollen glands and easy bruising.  Cardiovascular:   Patient denies leg swelling and chest pains.  Respiratory:  Patient denies cough and shortness of breath.  Endocrine:   Patient denies excessive thirst.  Musculoskeletal:   Patient denies back pain and joint pain.  Neurological:   Patient denies dizziness and headaches.  Psychologic:   Patient denies depression and anxiety.   Notes: slow stream as of TOday discolored urine    VITAL  SIGNS:      09/27/2018 02:29 PM  BP 119/74 mmHg  Heart Rate 86 /min  Temperature 98.2 F / 36.7 C   MULTI-SYSTEM PHYSICAL EXAMINATION:    Constitutional: Well-nourished. No physical deformities. Normally developed. Good grooming.  Respiratory: No labored breathing, no use of accessory muscles.   Cardiovascular: Normal temperature, adequate perfusion of extremities  Skin: No paleness, no jaundice  Neurologic / Psychiatric: Oriented to time, oriented to place, oriented to person. No depression, no anxiety, no agitation.   Gastrointestinal: No mass, no tenderness, no rigidity, non obese abdomen. No CVA tenderness bilaterally  Eyes: Normal conjunctivae. Normal eyelids.  Musculoskeletal: Normal gait and station of head and neck.     PAST DATA REVIEWED:  Source Of History:  Patient  Records Review:   Previous Patient Records  X-Ray Review: C.T. Abdomen/Pelvis: Reviewed Films. Reviewed Report. Discussed With Patient. I reviewed her CT scan from 08/22/2018 that revealed a likely right-sided distal ureteral calculus with no hydronephrosis. KUB on 09/13/2018 showed a radiopacity in the right lower pelvis consistent with possible persistent ureteral calculus.    PROCEDURES:          Urinalysis w/Scope - 81001 Dipstick Dipstick Cont'd Micro  Specimen: Voided Bilirubin: Neg WBC/hpf: 10 - 20/hpf  Color: Yellow Ketones: Neg RBC/hpf: 10 - 20/hpf  Appearance: Cloudy Blood: Trace Bacteria: Few (10-25/hpf)  Specific Gravity: 1.025 Protein: Trace Cystals: NS (Not Seen)  pH: 5.0 Urobilinogen: 0.2 Casts: NS (Not Seen)  Glucose: Neg Nitrites: Neg Trichomonas: Not Present    Leukocyte Esterase: 1+ Mucous: Not Present      Epithelial Cells: 6 - 10/hpf      Yeast: NS (Not Seen)      Sperm: Not Present         Urinalysis w/Scope - 81001 Dipstick Dipstick Cont'd Micro  Color: Yellow Bilirubin: Neg mg/dL WBC/hpf: 10 - 20/hpf  Appearance: Slightly Cloudy Ketones: Neg mg/dL RBC/hpf: 10 - 20/hpf  Specific  Gravity: 1.025 Blood: Trace ery/uL Bacteria: Few (10-25/hpf)  pH: <=5.0 Protein: Trace mg/dL Cystals: NS (Not Seen)  Glucose: Neg mg/dL Urobilinogen: 0.2 mg/dL Casts: NS (Not Seen)    Nitrites: Neg Trichomonas: Not Present    Leukocyte Esterase: 1+ leu/uL Mucous: Not Present      Epithelial Cells: 6 - 10/hpf      Yeast: NS (Not Seen)      Sperm: Not Present    ASSESSMENT:      ICD-10 Details  1 GU:   Ureteral calculus - F62.1   2   Renal colic - H08    PLAN:           Orders Labs Urine Culture          Document Letter(s):  Created for Patient: Clinical Summary         Notes:   She would like to proceed with ureteroscopy. She understands the risks including but not limited to bleeding, infection, injury to surrounding structures including possibility of ureteral transection, pain, need for additional procedures.   Cc: Dr. Deland Pretty, M.D.  Dr. Edrick Oh, M.D.  Dr. Bjorn Loser, M.D.    Signed by Link Snuffer,  III, M.D. on 09/27/18 at 4:38 PM (EDT

## 2018-10-09 NOTE — Interval H&P Note (Signed)
History and Physical Interval Note:  10/09/2018 10:19 AM  [name]  has presented today for surgery, with the diagnosis of RIGHT URETERAL STONE.  The various methods of treatment have been discussed with the patient and family. After consideration of risks, benefits and other options for treatment, the patient has consented to  Procedure(s): CYSTOSCOPY WITH RIGHT  URETEROSCOPY Mc[place] (Right) as a surgical intervention.  The patient's history has been reviewed, patient examined, no change in status, stable for surgery.  I have reviewed the patient's chart and labs.  Questions were answered to the patient's satisfaction.     Marton Redwood, III

## 2018-10-09 NOTE — Anesthesia Procedure Notes (Signed)
Procedure Name: LMA Insertion Date/Time: 10/09/2018 11:55 AM Performed by: Suan Halter, CRNA Pre-anesthesia Checklist: Patient identified, Emergency Drugs available, Suction available and Patient being monitored Patient Re-evaluated:Patient Re-evaluated prior to induction Oxygen Delivery Method: Circle system utilized Preoxygenation: Pre-oxygenation with 100% oxygen Induction Type: IV induction Ventilation: Mask ventilation without difficulty LMA: LMA inserted LMA Size: 4.0 Number of attempts: 1 Airway Equipment and Method: Bite block Placement Confirmation: positive ETCO2 Tube secured with: Tape Dental Injury: Teeth and Oropharynx as per pre-operative assessment

## 2018-10-09 NOTE — Anesthesia Preprocedure Evaluation (Signed)
Anesthesia Evaluation  Patient identified by MRN, date of birth, ID band Patient awake    Reviewed: Allergy & Precautions, H&P , NPO status , Patient's Chart, lab work & pertinent test results  Airway Mallampati: II  TM Distance: >3 FB Neck ROM: Full    Dental no notable dental hx. (+) Edentulous Upper, Edentulous Lower, Lower Dentures, Upper Dentures   Pulmonary neg pulmonary ROS, shortness of breath and with exertion, Current Smoker,    Pulmonary exam normal breath sounds clear to auscultation       Cardiovascular negative cardio ROS Normal cardiovascular exam Rhythm:Regular Rate:Normal     Neuro/Psych  Headaches, negative psych ROS   GI/Hepatic negative GI ROS, Neg liver ROS, GERD  ,  Endo/Other  negative endocrine ROS  Renal/GU negative Renal ROSLiddle's syndrome   negative genitourinary   Musculoskeletal  (+) Arthritis ,   Abdominal   Peds negative pediatric ROS (+)  Hematology negative hematology ROS (+)   Anesthesia Other Findings   Reproductive/Obstetrics negative OB ROS                             Anesthesia Physical  Anesthesia Plan  ASA: II  Anesthesia Plan: General   Post-op Pain Management:    Induction: Intravenous  PONV Risk Score and Plan: 2 and Ondansetron, Midazolam and Treatment may vary due to age or medical condition  Airway Management Planned: LMA  Additional Equipment:   Intra-op Plan:   Post-operative Plan: Extubation in OR  Informed Consent: I have reviewed the patients History and Physical, chart, labs and discussed the procedure including the risks, benefits and alternatives for the proposed anesthesia with the patient or authorized representative who has indicated his/her understanding and acceptance.     Dental advisory given  Plan Discussed with: CRNA  Anesthesia Plan Comments:         Anesthesia Quick Evaluation

## 2018-10-09 NOTE — Transfer of Care (Signed)
Immediate Anesthesia Transfer of Care Note  Patient: Jenny Oliver  Procedure(s) Performed: Procedure(s) (LRB): CYSTOSCOPY WITH RIGHT  URETEROSCOPY HOLMIUN LASER , RETROGRADE AND STENT PLACEMENT (Right)  Patient Location: PACU  Anesthesia Type: General  Level of Consciousness: awake, oriented, sedated and patient cooperative  Airway & Oxygen Therapy: Patient Spontanous Breathing and Patient connected to face mask oxygen  Post-op Assessment: Report given to PACU RN and Post -op Vital signs reviewed and stable  Post vital signs: Reviewed and stable  Complications: No apparent anesthesia complications Last Vitals:  Vitals Value Taken Time  BP 149/82 10/09/18 1245  Temp 36.8 C 10/09/18 1239  Pulse 77 10/09/18 1247  Resp 13 10/09/18 1247  SpO2 100 % 10/09/18 1247  Vitals shown include unvalidated device data.  Last Pain:  Vitals:   10/09/18 1239  TempSrc:   PainSc: 0-No pain      Patients Stated Pain Goal: 8 (10/09/18 1030)

## 2018-10-09 NOTE — Anesthesia Postprocedure Evaluation (Signed)
Anesthesia Post Note  Patient: Jenny Oliver  Procedure(s) Performed: CYSTOSCOPY WITH RIGHT  URETEROSCOPY HOLMIUN LASER , RETROGRADE AND STENT PLACEMENT (Right Renal)     Patient location during evaluation: PACU Anesthesia Type: General Level of consciousness: awake and alert Pain management: pain level controlled Vital Signs Assessment: post-procedure vital signs reviewed and stable Respiratory status: spontaneous breathing, nonlabored ventilation and respiratory function stable Cardiovascular status: blood pressure returned to baseline and stable Postop Assessment: no apparent nausea or vomiting Anesthetic complications: no    Last Vitals:  Vitals:   10/09/18 1300 10/09/18 1345  BP: 138/63 (!) 144/75  Pulse: 65 61  Resp: 14 14  Temp:  36.6 C  SpO2: 97% 98%    Last Pain:  Vitals:   10/09/18 1345  TempSrc:   PainSc: 0-No pain                 Lynda Rainwater

## 2018-10-09 NOTE — Op Note (Signed)
Operative Note  Preoperative diagnosis:  1.  Right ureteral calculus  Postoperative diagnosis: 1.  Right ureteral calculus  Procedure(s): 1.  Cystoscopy, right retrograde pyelogram, right ureteroscopy with laser lithotripsy, ureteral stent placement  Surgeon: Link Snuffer, MD  Assistants: None  Anesthesia: General  Complications: None immediate  EBL: Minimal  Specimens: 1.  None  Drains/Catheters: 1.  6 x 24 double-J ureteral stent  Intraoperative findings: 1.  Normal urethra and bladder 2.  Approximately 5 mm distal right ureteral calculus at the ureterovesicular junction.  Afterward, right retrograde pyelogram revealed a well opacified ureter with no filling defects in the ureter or the kidney.  There was no hydronephrosis.  Indication: 73 year old female found to have a right ureteral calculus presents for the previously mentioned operation  Description of procedure:  The patient was identified and consent was obtained.  The patient was taken to the operating room and placed in the supine position.  The patient was placed under general anesthesia.  Perioperative antibiotics were administered.  The patient was placed in dorsal lithotomy.  Patient was prepped and draped in a standard sterile fashion and a timeout was performed.  A 21 French rigid cystoscope was advanced into the urethra and into the bladder.  Complete cystoscopy was performed with no abnormal findings.  The right ureter was cannulated with a wire which was advanced up to the kidney under fluoroscopic guidance.  A semirigid ureteroscope was advanced up to the stone.  This was at the ureterovesicular junction.  I tried to basket it but could not get around it.  I therefore used a laser fiber to break it up into tiny pieces.  I advanced the scope up to the renal pelvis and no other calculi were seen.  I shot a retrograde pyelogram through the scope with the findings noted above.  I then carefully withdrew the scope  visualizing the entire ureter upon removal.  There was no significant trauma and there was no clinically significant ureteral calculi.  Backloaded the wire onto a rigid cystoscope and advanced that into the bladder followed by routine placement of a 6 x 24 double-J ureteral stent in a standard fashion followed by removal of the wire.  Fluoroscopy confirmed proximal placement and direct visualization confirmed a good coil within the bladder.  I drained the bladder and withdrew the scope.  Patient tolerated procedure well and stable postoperatively.  Plan: Return in about 1 week for stent removal.

## 2018-10-09 NOTE — Discharge Instructions (Addendum)
[facility] [phone] Post Ureteroscopy With or Without Stent Instructions  Definitions:  Ureter: The duct that transports urine from the kidney to the bladder. Stent:   A plastic hollow tube that is placed into the ureter, from the kidney to the                 bladder to prevent the ureter from swelling shut.  GENERAL INSTRUCTIONS:  Despite the fact that no skin incisions were used, the area around the ureter and bladder is raw and irritated. The stent is a foreign body which will further irritate the bladder wall. This irritation is manifested by increased frequency of urination, both day and night, and by an increase in the urge to urinate. In some, the urge to urinate is present almost always. Sometimes the urge is strong enough that you may not be able to stop yourself from urinating. The only real cure is to remove the stent and then give time for the bladder wall to heal which can't be done until the danger of the ureter swelling shut has passed, which varies.  You may see some blood in your urine while the stent is in place and a few days afterwards. Do not be alarmed, even if the urine was clear for a while. Get off your feet and drink lots of fluids until clearing occurs. If you start to pass clots or don't improve, call us.  DIET: You may return to your normal diet immediately. Because of the raw surface of your bladder, alcohol, spicy foods, acid type foods and drinks with caffeine may cause irritation or frequency and should be used in moderation. To keep your urine flowing freely and to avoid constipation, drink plenty of fluids during the day ( 8-10 glasses ). Tip: Avoid cranberry juice because it is very acidic.  ACTIVITY: Your physical activity doesn't need to be restricted. However, if you are very active, you may see some blood in your urine. We suggest that you reduce your activity under these circumstances until the bleeding has stopped.  BOWELS: It is  important to keep your bowels regular during the postoperative period. Straining with bowel movements can cause bleeding. A bowel movement every other day is reasonable. Use a mild laxative if needed, such as Milk of Magnesia 2-3 tablespoons, or 2 Dulcolax tablets. Call if you continue to have problems. If you have been taking narcotics for pain, before, during or after your surgery, you may be constipated. Take a laxative if necessary.   MEDICATION: You should resume your pre-surgery medications unless told not to. You may take oxybutynin or flomax if prescribed for bladder spasms or discomfort from the stent Take pain medication as directed for pain refractory to conservative management  PROBLEMS YOU SHOULD REPORT TO US:  Fevers over 100.5 Fahrenheit.  Heavy bleeding, or clots ( See above notes about blood in urine ).  Inability to urinate.  Drug reactions ( hives, rash, nausea, vomiting, diarrhea ).  Severe burning or pain with urination that is not improving.  Post Anesthesia Home Care Instructions  Activity: Get plenty of rest for the remainder of the day. A responsible adult should stay with you for 24 hours following the procedure.  For the next 24 hours, DO NOT: -Drive a car -Operate machinery -Drink alcoholic beverages -Take any medication unless instructed by your physician -Make any legal decisions or sign important papers.  Meals: Start with liquid foods such as gelatin or soup. Progress to regular foods as   tolerated. Avoid greasy, spicy, heavy foods. If nausea and/or vomiting occur, drink only clear liquids until the nausea and/or vomiting subsides. Call your physician if vomiting continues.  Special Instructions/Symptoms: Your throat may feel dry or sore from the anesthesia or the breathing tube placed in your throat during surgery. If this causes discomfort, gargle with warm salt water. The discomfort should disappear within 24 hours.  If you had a scopolamine  patch placed behind your ear for the management of post- operative nausea and/or vomiting:  1. The medication in the patch is effective for 72 hours, after which it should be removed.  Wrap patch in a tissue and discard in the trash. Wash hands thoroughly with soap and water. 2. You may remove the patch earlier than 72 hours if you experience unpleasant side effects which may include dry mouth, dizziness or visual disturbances. 3. Avoid touching the patch. Wash your hands with soap and water after contact with the patch.    

## 2018-10-10 ENCOUNTER — Other Ambulatory Visit: Payer: Self-pay | Admitting: Orthopedic Surgery

## 2018-10-10 ENCOUNTER — Encounter (HOSPITAL_BASED_OUTPATIENT_CLINIC_OR_DEPARTMENT_OTHER): Payer: Self-pay | Admitting: Urology

## 2018-10-10 DIAGNOSIS — R2231 Localized swelling, mass and lump, right upper limb: Secondary | ICD-10-CM

## 2018-10-10 DIAGNOSIS — L608 Other nail disorders: Secondary | ICD-10-CM

## 2018-10-12 DIAGNOSIS — N200 Calculus of kidney: Secondary | ICD-10-CM | POA: Diagnosis not present

## 2018-10-12 DIAGNOSIS — F17210 Nicotine dependence, cigarettes, uncomplicated: Secondary | ICD-10-CM | POA: Diagnosis not present

## 2018-10-12 DIAGNOSIS — E7800 Pure hypercholesterolemia, unspecified: Secondary | ICD-10-CM | POA: Diagnosis not present

## 2018-10-12 DIAGNOSIS — J449 Chronic obstructive pulmonary disease, unspecified: Secondary | ICD-10-CM | POA: Diagnosis not present

## 2018-10-12 DIAGNOSIS — I151 Hypertension secondary to other renal disorders: Secondary | ICD-10-CM | POA: Diagnosis not present

## 2018-10-12 DIAGNOSIS — M8580 Other specified disorders of bone density and structure, unspecified site: Secondary | ICD-10-CM | POA: Diagnosis not present

## 2018-10-23 DIAGNOSIS — R319 Hematuria, unspecified: Secondary | ICD-10-CM | POA: Diagnosis not present

## 2018-10-23 DIAGNOSIS — E785 Hyperlipidemia, unspecified: Secondary | ICD-10-CM | POA: Diagnosis not present

## 2018-10-23 DIAGNOSIS — K589 Irritable bowel syndrome without diarrhea: Secondary | ICD-10-CM | POA: Diagnosis not present

## 2018-10-23 DIAGNOSIS — Z720 Tobacco use: Secondary | ICD-10-CM | POA: Diagnosis not present

## 2018-10-23 DIAGNOSIS — I129 Hypertensive chronic kidney disease with stage 1 through stage 4 chronic kidney disease, or unspecified chronic kidney disease: Secondary | ICD-10-CM | POA: Diagnosis not present

## 2018-10-23 DIAGNOSIS — K219 Gastro-esophageal reflux disease without esophagitis: Secondary | ICD-10-CM | POA: Diagnosis not present

## 2018-10-23 DIAGNOSIS — I151 Hypertension secondary to other renal disorders: Secondary | ICD-10-CM | POA: Diagnosis not present

## 2018-10-23 DIAGNOSIS — J449 Chronic obstructive pulmonary disease, unspecified: Secondary | ICD-10-CM | POA: Diagnosis not present

## 2018-10-24 ENCOUNTER — Other Ambulatory Visit: Payer: Self-pay

## 2018-10-24 ENCOUNTER — Ambulatory Visit
Admission: RE | Admit: 2018-10-24 | Discharge: 2018-10-24 | Disposition: A | Discharge status: Home / Self Care | Payer: Medicare Other | Source: Ambulatory Visit | Attending: Neurology | Admitting: Neurology

## 2018-10-24 DIAGNOSIS — N201 Calculus of ureter: Secondary | ICD-10-CM | POA: Diagnosis not present

## 2018-10-24 DIAGNOSIS — D329 Benign neoplasm of meninges, unspecified: Secondary | ICD-10-CM | POA: Diagnosis not present

## 2018-10-26 ENCOUNTER — Telehealth: Payer: Self-pay | Admitting: Neurology

## 2018-10-26 NOTE — Telephone Encounter (Signed)
I called the patient.  MRI of the brain is stable, no enlargement of the meningioma seen on the study as compared to 2017 evaluation.   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 No change from 09/23/15.  - Mild chronic small vessel ischemic disease.  - No acute findings.

## 2018-11-09 ENCOUNTER — Inpatient Hospital Stay: Admission: RE | Admit: 2018-11-09 | Payer: Medicare Other | Source: Ambulatory Visit

## 2018-11-14 DIAGNOSIS — M79632 Pain in left forearm: Secondary | ICD-10-CM | POA: Diagnosis not present

## 2018-11-14 DIAGNOSIS — M79622 Pain in left upper arm: Secondary | ICD-10-CM | POA: Diagnosis not present

## 2018-11-14 DIAGNOSIS — R51 Headache: Secondary | ICD-10-CM | POA: Diagnosis not present

## 2018-11-14 DIAGNOSIS — M79644 Pain in right finger(s): Secondary | ICD-10-CM | POA: Diagnosis not present

## 2018-11-14 DIAGNOSIS — M79641 Pain in right hand: Secondary | ICD-10-CM | POA: Diagnosis not present

## 2018-11-14 DIAGNOSIS — S62632A Displaced fracture of distal phalanx of right middle finger, initial encounter for closed fracture: Secondary | ICD-10-CM | POA: Diagnosis not present

## 2018-11-16 DIAGNOSIS — R6884 Jaw pain: Secondary | ICD-10-CM | POA: Diagnosis not present

## 2018-12-13 ENCOUNTER — Ambulatory Visit: Payer: Medicare Other | Admitting: Podiatry

## 2019-01-03 DIAGNOSIS — N23 Unspecified renal colic: Secondary | ICD-10-CM | POA: Diagnosis not present

## 2019-01-09 ENCOUNTER — Ambulatory Visit: Payer: Medicare Other | Admitting: Podiatry

## 2019-01-29 ENCOUNTER — Other Ambulatory Visit: Payer: Self-pay | Admitting: *Deleted

## 2019-01-29 DIAGNOSIS — Z87891 Personal history of nicotine dependence: Secondary | ICD-10-CM

## 2019-01-29 DIAGNOSIS — F17210 Nicotine dependence, cigarettes, uncomplicated: Secondary | ICD-10-CM

## 2019-01-29 DIAGNOSIS — Z122 Encounter for screening for malignant neoplasm of respiratory organs: Secondary | ICD-10-CM

## 2019-01-30 ENCOUNTER — Ambulatory Visit: Payer: Medicare Other

## 2019-02-06 ENCOUNTER — Ambulatory Visit: Payer: Medicare Other

## 2019-02-06 ENCOUNTER — Other Ambulatory Visit: Payer: Self-pay

## 2019-02-06 ENCOUNTER — Ambulatory Visit
Admission: RE | Admit: 2019-02-06 | Discharge: 2019-02-06 | Disposition: A | Discharge status: Home / Self Care | Payer: Medicare Other | Source: Ambulatory Visit | Attending: Acute Care | Admitting: Acute Care

## 2019-02-06 DIAGNOSIS — F17210 Nicotine dependence, cigarettes, uncomplicated: Secondary | ICD-10-CM

## 2019-02-06 DIAGNOSIS — Z122 Encounter for screening for malignant neoplasm of respiratory organs: Secondary | ICD-10-CM

## 2019-02-06 DIAGNOSIS — Z87891 Personal history of nicotine dependence: Secondary | ICD-10-CM

## 2019-02-20 ENCOUNTER — Other Ambulatory Visit: Payer: Self-pay | Admitting: *Deleted

## 2019-02-20 DIAGNOSIS — F17210 Nicotine dependence, cigarettes, uncomplicated: Secondary | ICD-10-CM

## 2019-02-20 DIAGNOSIS — Z87891 Personal history of nicotine dependence: Secondary | ICD-10-CM

## 2019-02-20 DIAGNOSIS — Z122 Encounter for screening for malignant neoplasm of respiratory organs: Secondary | ICD-10-CM

## 2019-03-07 DIAGNOSIS — L0390 Cellulitis, unspecified: Secondary | ICD-10-CM | POA: Diagnosis not present

## 2019-03-07 DIAGNOSIS — I739 Peripheral vascular disease, unspecified: Secondary | ICD-10-CM | POA: Diagnosis not present

## 2019-03-16 ENCOUNTER — Encounter (HOSPITAL_COMMUNITY): Payer: Medicare Other

## 2019-03-20 ENCOUNTER — Encounter (HOSPITAL_COMMUNITY): Payer: Medicare Other

## 2019-03-21 ENCOUNTER — Encounter (HOSPITAL_COMMUNITY): Payer: Medicare Other

## 2019-03-21 ENCOUNTER — Other Ambulatory Visit (HOSPITAL_COMMUNITY): Payer: Self-pay | Admitting: Internal Medicine

## 2019-03-21 ENCOUNTER — Other Ambulatory Visit: Payer: Self-pay

## 2019-03-21 ENCOUNTER — Ambulatory Visit (HOSPITAL_COMMUNITY)
Admission: RE | Admit: 2019-03-21 | Discharge: 2019-03-21 | Disposition: A | Discharge status: Home / Self Care | Payer: Medicare Other | Source: Ambulatory Visit | Attending: Internal Medicine | Admitting: Internal Medicine

## 2019-03-21 DIAGNOSIS — R52 Pain, unspecified: Secondary | ICD-10-CM | POA: Insufficient documentation

## 2019-03-27 ENCOUNTER — Other Ambulatory Visit: Payer: Self-pay | Admitting: Internal Medicine

## 2019-03-27 DIAGNOSIS — R634 Abnormal weight loss: Secondary | ICD-10-CM

## 2019-04-03 ENCOUNTER — Ambulatory Visit
Admission: RE | Admit: 2019-04-03 | Discharge: 2019-04-03 | Disposition: A | Discharge status: Home / Self Care | Payer: Medicare Other | Source: Ambulatory Visit | Attending: Internal Medicine | Admitting: Internal Medicine

## 2019-04-03 DIAGNOSIS — R634 Abnormal weight loss: Secondary | ICD-10-CM

## 2019-04-03 MED ORDER — IOPAMIDOL (ISOVUE-300) INJECTION 61%
100.0000 mL | Freq: Once | INTRAVENOUS | Status: AC | PRN
  Administered 2019-04-03: 100 mL via INTRAVENOUS

## 2019-04-10 DIAGNOSIS — R7301 Impaired fasting glucose: Secondary | ICD-10-CM | POA: Diagnosis not present

## 2019-04-10 DIAGNOSIS — R634 Abnormal weight loss: Secondary | ICD-10-CM | POA: Diagnosis not present

## 2019-04-10 DIAGNOSIS — F17210 Nicotine dependence, cigarettes, uncomplicated: Secondary | ICD-10-CM | POA: Diagnosis not present

## 2019-04-11 ENCOUNTER — Ambulatory Visit (INDEPENDENT_AMBULATORY_CARE_PROVIDER_SITE_OTHER): Payer: Medicare Other | Admitting: Internal Medicine

## 2019-04-11 ENCOUNTER — Encounter: Payer: Self-pay | Admitting: Internal Medicine

## 2019-04-11 ENCOUNTER — Other Ambulatory Visit: Payer: Self-pay

## 2019-04-11 DIAGNOSIS — I878 Other specified disorders of veins: Secondary | ICD-10-CM | POA: Insufficient documentation

## 2019-04-11 NOTE — Progress Notes (Signed)
Patient ID: Jenny Oliver, female   DOB: 1945-10-24, 74 y.o.   MRN: [id number]     [facility]      Reason for Consult: cellulitis    Referring Physician: Dr Shelia Media    Patient ID: Jenny Oliver, female    DOB: [birth year], 74 y.o.   MRN: [id number]  HPI:   Here for evaluation of lower leg erythema.   She describes a history of 2 years of symptoms she equates to an injury to her toe.  She then developed swelling of both of her legs at that time which has been a chronic issue.  She was seen last month by Dr. Shelia Media with concern for infection with increased redness and some warmth.  She describes it as being mildly warmer than her left leg at the time.  She does show me pictures of past times with red legs.  She feels the same occurs in her left leg.  She was initially put on clindamcycin but did not tolerate it and more recenlty Keflex.  She feels it is improved. No fever or chills. She also describes some leg pain with activity that improves with rest.  Also some nerve like leg pain with swelling at rest occasionaly that is relieved with leg movement.   Previous record reviewed from Dr Shelia Media and partially summarized above.   Past Medical History:  Diagnosis Date  . Abnormality of gait    due to balance instability,  episodic  . Benign paroxysmal positional vertigo   . Bilateral lower extremity edema   . Chronic insomnia 04/24/2015  . COPD with emphysema ([place])    last chest CT 01-12-2018  . DDD (degenerative disc disease), lumbosacral   . Full dentures   . GERD (gastroesophageal reflux disease)    no meds  . Headache   . Hiatal hernia   . History of esophageal dilatation    2013 balloon dilatation  . History of Helicobacter pylori infection   . Hypertensive chronic kidney disease with stage 1 through stage 4 chronic kidney disease, or unspecified chronic kidney disease    per pt pcp note in epic  . IBS (irritable bowel syndrome)   . IBS (irritable bowel  syndrome)   . Liddle's syndrome    hypertension treated with potassium sparing diuretic, amiloride  . Meningioma ([place])    left cavernous sinus, MRI 09-16-2015 in epic;  followed by neurologist, dr Jannifer Franklin  . OA (osteoarthritis)   . Right ureteral stone   . Sciatica   . Smokers' cough ([place])    occasionally productive  . Vitamin B12 deficiency   . Vitamin D deficiency     Prior to Admission medications   Medication Sig Start Date End Date Taking? Authorizing Provider  acetaminophen (TYLENOL) 650 MG CR tablet Take 1,300 mg by mouth daily.    Yes [provider]  aMILoride (MIDAMOR) 5 MG tablet Take 5 mg by mouth 2 (two) times daily.    Yes [provider]  ascorbic acid (VITAMIN C) 1000 MG tablet Take 1,000 mg by mouth 2 (two) times daily.   Yes [provider]  Cholecalciferol (VITAMIN D3) 2000 units TABS Take 1 tablet by mouth daily.   Yes [provider]  Coenzyme Q10 (CO Q 10) 100 MG CAPS Take 1 capsule by mouth daily.    Yes [provider]  Collagen 500 MG CAPS Take 2 capsules by mouth daily.    Yes [provider]  furosemide (  LASIX) 20 MG tablet Take 20 mg by mouth at bedtime.  08/14/18  Yes [provider]  Glucosamine HCl-MSM 750-750 MG TABS Take 1 tablet by mouth 2 (two) times daily.   Yes [provider]  HYDROcodone-acetaminophen (NORCO) 5-325 MG tablet Take 1 tablet by mouth every 4 (four) hours as needed for moderate pain. 10/09/18  Yes Marton Redwood III, MD  Magnesium 500 MG TABS Take 1 tablet by mouth daily.   Yes [provider]  Multiple Vitamins-Minerals (EMERGEN-C IMMUNE PO) Take by mouth. Takes daily in winter.   Yes [provider]  NONFORMULARY OR COMPOUNDED [place] compound:  Antiinflammatory Cream - Diclofenac 3%, Baclofen 2%, Cyclobenzaprine 2%, Lidocaine 2%, apply 1-2 grams to affected area 3-4 times daily. 10/07/15  Yes Stover, Titorya, DPM  pravastatin  (PRAVACHOL) 40 MG tablet Take 40 mg by mouth at bedtime.   Yes [provider]  Probiotic Product (PROBIOTIC DAILY PO) Take by mouth 2 (two) times daily.   Yes [provider]  tamsulosin (FLOMAX) 0.4 MG CAPS capsule Take 1 capsule (0.4 mg total) by mouth daily. 10/09/18  Yes Lucas Mallow, MD    Allergies  Allergen Reactions  . Crestor [Rosuvastatin]   . Lipitor [Atorvastatin]   . Other     Per pt avoids Bread, peanuts/tree nuts due to bloating  opioids causes nausea and vomiting Lactose intolerance  . Zetia [Ezetimibe]     Social History   Tobacco Use  . Smoking status: Current Every Day Smoker    Packs/day: 1.50    Years: 48.00    Pack years: 72.00    Types: Cigarettes  . Smokeless tobacco: Never Used  . Tobacco comment: 10-04-2018  per pt down to 2-3 cig per day from 1-2 ppd  Substance Use Topics  . Alcohol use: No  . Drug use: Never    Family History  Problem Relation Age of Onset  . Heart disease Mother   . Hyperlipidemia Mother   . Cancer Father        stomach  . Healthy Brother      Review of Systems  Constitutional: negative for fevers, chills and malaise Gastrointestinal: negative for nausea and diarrhea Musculoskeletal: negative for myalgias and arthralgias All other systems reviewed and are negative    Constitutional: in no apparent distress  Vitals:   04/11/19 1510  BP: 129/77  Pulse: 77  Temp: 98 F (36.7 C)   EYES: anicteric ENMT: + mask Musculoskeletal: right leg with some mild erythema, no warmth, some edema, thin legs.   Skin: negatives: no rash Neuro: non-focal  Labs: Lab Results  Component Value Date   WBC 4.7 09/06/2013   HGB 13.3 10/09/2018   HCT 39.0 10/09/2018   MCV 95.7 09/06/2013   PLT 317 09/06/2013    Lab Results  Component Value Date   CREATININE 0.70 10/09/2018   BUN 9 10/09/2018   NA 144 10/09/2018   K 3.2 (L) 10/09/2018   CL 106 10/09/2018   CO2 28 09/06/2013    Lab Results  Component  Value Date   ALT 21 09/06/2013   AST 18 09/06/2013   ALKPHOS 67 09/06/2013   BILITOT 1.0 09/06/2013     Assessment: venous insufficiency.  Skin changes at this time c/w venous insufficiency skin discoloration.  I discussed with her that her leg skin changes will remain with some intermittent redness with the swelling.  At this time there is no sign of infection.  I discussed with her signs of infection including significant warmth and tenderness of her leg associated with much more erythema.  I discussed using compression stockings, leg elevation and smoking cessation.    Plan: 1) compression stockings as needed 2) no further antibiotics indicated. Follow up as needed.  Thanks for referral.

## 2019-04-13 DIAGNOSIS — K3189 Other diseases of stomach and duodenum: Secondary | ICD-10-CM | POA: Diagnosis not present

## 2019-04-13 DIAGNOSIS — J449 Chronic obstructive pulmonary disease, unspecified: Secondary | ICD-10-CM | POA: Diagnosis not present

## 2019-04-13 DIAGNOSIS — R630 Anorexia: Secondary | ICD-10-CM | POA: Diagnosis not present

## 2019-04-13 DIAGNOSIS — R634 Abnormal weight loss: Secondary | ICD-10-CM | POA: Diagnosis not present

## 2019-04-24 DIAGNOSIS — R634 Abnormal weight loss: Secondary | ICD-10-CM | POA: Diagnosis not present

## 2019-04-24 DIAGNOSIS — F17210 Nicotine dependence, cigarettes, uncomplicated: Secondary | ICD-10-CM | POA: Diagnosis not present

## 2019-05-10 DIAGNOSIS — Z86010 Personal history of colonic polyps: Secondary | ICD-10-CM | POA: Diagnosis not present

## 2019-05-10 DIAGNOSIS — R634 Abnormal weight loss: Secondary | ICD-10-CM | POA: Diagnosis not present

## 2019-09-17 ENCOUNTER — Encounter: Payer: Self-pay | Admitting: Podiatrist

## 2019-09-17 ENCOUNTER — Other Ambulatory Visit: Payer: Self-pay

## 2019-09-17 ENCOUNTER — Ambulatory Visit (INDEPENDENT_AMBULATORY_CARE_PROVIDER_SITE_OTHER): Payer: Medicare Other | Admitting: Podiatrist

## 2019-09-17 DIAGNOSIS — M21621 Bunionette of right foot: Secondary | ICD-10-CM | POA: Diagnosis not present

## 2019-09-17 DIAGNOSIS — M21622 Bunionette of left foot: Secondary | ICD-10-CM | POA: Diagnosis not present

## 2019-09-17 DIAGNOSIS — L84 Corns and callosities: Secondary | ICD-10-CM | POA: Diagnosis not present

## 2019-09-17 NOTE — Patient Instructions (Signed)

## 2019-09-18 NOTE — Progress Notes (Signed)
  Chief Complaint  Patient presents with  . Foot Pain    pt is here for a bil 5th toe pain/ possible arthritis which has been going on for about 4 years, pt states that it painful to the touch, and states that she is looking to have her feet looked at,     HPI: Patient is 74 y.o. female who presents today for the concerns as listed above.  She relates pain on both fifth toes and at the fifth metatarsal region leftt.  She states she is able to trim down the skin which helps her with the discomfort. She presents for further suggestions on how to keep her feet comfortable.    Review of Systems No fevers, chills, nausea, muscle aches, no difficulty breathing, no calf pain, no chest pain or shortness of breath.   Physical Exam  GENERAL APPEARANCE: Alert, conversant. Appropriately groomed. No acute distress.   VASCULAR: Pedal pulses palpable DP and PT bilateral.  Capillary refill time is immediate to all digits,  Proximal to distal cooling it warm to warm. Digital perfusion adequate.   NEUROLOGIC: sensation is intact epicritically and protectively to 5.07 monofilament at 5/5 sites bilateral.  Light touch is intact bilateral, vibratory sensation intact bilateral, achilles tendon reflex is intact bilateral.   MUSCULOSKELETAL: acceptable muscle strength, tone and stability bilateral.  Mild tailors bunions bilateral noted.    DERMATOLOGIC: skin is warm, supple, and dry. Hyperkeratotic lesions noted submet 5 left with a enucleated core and ground glass appearance noted.  Fat pad atrophy also noted in this region.  Digital nails are asymptomatic.      Assessment     ICD-10-CM   1. Tailor's bunion of both feet  M21.621    M21.622   2. Corns and callosities  L84      Plan  Discussed her pain and the contributing factors. I pared down the lesions today with a 15 blade without complication.  Recommended padding and routine debridement of the callused skin.  Recommended a pain cream to help with  the general discomfort at the fifth mpj's she is experiencing- rx will be called into [facility].  She will be seen back as needed for follow up and will call if any concerns arise.

## 2019-09-20 ENCOUNTER — Telehealth: Payer: Self-pay | Admitting: *Deleted

## 2019-09-20 MED ORDER — NONFORMULARY OR COMPOUNDED ITEM: 5 refills | Status: DC

## 2019-09-20 NOTE — Telephone Encounter (Signed)
Faxed orders and demographics to [facility].

## 2019-09-20 NOTE — Telephone Encounter (Signed)
-----   Message from Bronson Ing, [place] sent at 09/18/2019 12:42 PM EDT ----- Regarding: Katina Degree,  Would you mind faxing in a prescription for pain cream from [facility] for Julie-Ann Vanmaanen- the one for arthritis would be great.  Thanks!!  Dr. Johnette Abraham

## 2020-02-11 ENCOUNTER — Other Ambulatory Visit: Payer: Self-pay | Admitting: Acute Care

## 2020-02-13 ENCOUNTER — Other Ambulatory Visit: Payer: Self-pay

## 2020-02-13 ENCOUNTER — Ambulatory Visit
Admission: RE | Admit: 2020-02-13 | Discharge: 2020-02-13 | Disposition: A | Discharge status: Home / Self Care | Payer: Medicare Other | Source: Ambulatory Visit | Attending: Acute Care | Admitting: Acute Care

## 2020-02-13 DIAGNOSIS — F17210 Nicotine dependence, cigarettes, uncomplicated: Secondary | ICD-10-CM | POA: Diagnosis not present

## 2020-02-13 DIAGNOSIS — J432 Centrilobular emphysema: Secondary | ICD-10-CM | POA: Diagnosis not present

## 2020-02-13 DIAGNOSIS — Z122 Encounter for screening for malignant neoplasm of respiratory organs: Secondary | ICD-10-CM

## 2020-02-13 DIAGNOSIS — Z87891 Personal history of nicotine dependence: Secondary | ICD-10-CM

## 2020-02-13 DIAGNOSIS — K449 Diaphragmatic hernia without obstruction or gangrene: Secondary | ICD-10-CM | POA: Diagnosis not present

## 2020-02-13 DIAGNOSIS — I2510 Atherosclerotic heart disease of native coronary artery without angina pectoris: Secondary | ICD-10-CM | POA: Diagnosis not present

## 2020-02-15 ENCOUNTER — Other Ambulatory Visit: Payer: Self-pay | Admitting: *Deleted

## 2020-02-15 DIAGNOSIS — Z87891 Personal history of nicotine dependence: Secondary | ICD-10-CM

## 2020-02-15 DIAGNOSIS — F17210 Nicotine dependence, cigarettes, uncomplicated: Secondary | ICD-10-CM

## 2020-02-15 NOTE — Progress Notes (Signed)
Please call patient and let them  know their  low dose Ct was read as a  Lung RADS 2: nodules that are benign in appearance and behavior with a very low likelihood of becoming a clinically active cancer due to size or lack of growth. Recommendation per radiology is for a repeat LDCT in 12 months. Please let them  know we will order and schedule their  annual screening scan for 01/2021. Please let them  know there was notation of CAD on their  scan.  Please remind the patient  that this is a non-gated exam therefore degree or severity of disease  cannot be determined. Please have them  follow up with their PCP regarding potential risk factor modification, dietary therapy or pharmacologic therapy if clinically indicated. Pt.  is currently  currently on statin therapy. Please place order for annual  screening scan for  01/2021 and fax results to PCP. Thanks so much.

## 2020-02-28 DIAGNOSIS — I2510 Atherosclerotic heart disease of native coronary artery without angina pectoris: Secondary | ICD-10-CM | POA: Diagnosis not present

## 2020-02-28 DIAGNOSIS — Z79899 Other long term (current) drug therapy: Secondary | ICD-10-CM | POA: Diagnosis not present

## 2020-02-28 DIAGNOSIS — Z8719 Personal history of other diseases of the digestive system: Secondary | ICD-10-CM | POA: Diagnosis not present

## 2020-02-28 DIAGNOSIS — I152 Hypertension secondary to endocrine disorders: Secondary | ICD-10-CM | POA: Diagnosis not present

## 2020-02-28 DIAGNOSIS — E870 Hyperosmolality and hypernatremia: Secondary | ICD-10-CM | POA: Diagnosis not present

## 2020-02-28 DIAGNOSIS — F17210 Nicotine dependence, cigarettes, uncomplicated: Secondary | ICD-10-CM | POA: Diagnosis not present

## 2020-02-28 DIAGNOSIS — E538 Deficiency of other specified B group vitamins: Secondary | ICD-10-CM | POA: Diagnosis not present

## 2020-02-28 DIAGNOSIS — R7303 Prediabetes: Secondary | ICD-10-CM | POA: Diagnosis not present

## 2020-02-28 DIAGNOSIS — I151 Hypertension secondary to other renal disorders: Secondary | ICD-10-CM | POA: Diagnosis not present

## 2020-02-28 DIAGNOSIS — E785 Hyperlipidemia, unspecified: Secondary | ICD-10-CM | POA: Diagnosis not present

## 2020-02-29 ENCOUNTER — Telehealth: Payer: Self-pay

## 2020-02-29 NOTE — Telephone Encounter (Signed)
NOTES ON FILE FROM [facility] [phone], SENT REFERRAL TO SCHEDULING 

## 2020-04-02 ENCOUNTER — Ambulatory Visit: Payer: Medicare Other | Admitting: Internal Medicine

## 2020-04-11 ENCOUNTER — Ambulatory Visit: Payer: Medicare Other | Admitting: Interventional Cardiology

## 2020-04-16 DIAGNOSIS — L03115 Cellulitis of right lower limb: Secondary | ICD-10-CM | POA: Diagnosis not present

## 2020-04-16 DIAGNOSIS — L03116 Cellulitis of left lower limb: Secondary | ICD-10-CM | POA: Diagnosis not present

## 2020-04-28 NOTE — Progress Notes (Signed)
Cardiology Office Note:    Date:  04/29/2020   ID:  Jenny Oliver, DOB Jun 18, 1945, MRN [id number]  PCP:  Deland Pretty, MD  Cardiologist:  No primary care provider on file.   Referring MD: Deland Pretty, MD   No chief complaint on file.   History of Present Illness:    Jenny Oliver is a 75 y.o. female with a hx of h/o CAD without angina referred for Cardiology consultation from Dr. Audie Pinto. Recent chest CT with LM and LAD/Cfx calcification.  [name] is a daughter of a former patient, [name].  Her mother had coronary disease and had stents.  [name] is a cigarette smoker for many years.  She is referred because of the finding of multivessel coronary calcification by non coronary CT.  She complains of occasional sensations as if there is being pumped in her chest.  If she stops and rests she feels better.  She is assumed that this is related to COPD.  Secondly, she has Littles disease.  She has been on amiloride for years.  Off of amiloride she develops hypokalemia.  She also has hypertension.  COPD has been significant.  She still smokes but less than a pack per week.  She has noticed significant lower extremity swelling progressive over the years.  She has no orthopnea.  She has significant dyspnea on exertion.  This with both her blood pressure and lipid therapy.  Past Medical History:  Diagnosis Date  . Abnormality of gait    due to balance instability,  episodic  . Benign paroxysmal positional vertigo   . Bilateral lower extremity edema   . Chronic insomnia 04/24/2015  . COPD with emphysema ([place])    last chest CT 01-12-2018  . DDD (degenerative disc disease), lumbosacral   . Full dentures   . GERD (gastroesophageal reflux disease)    no meds  . Headache   . Hiatal hernia   . History of esophageal dilatation    2013 balloon dilatation  . History of Helicobacter pylori infection   . Hypertensive chronic kidney disease with stage 1 through stage 4 chronic kidney  disease, or unspecified chronic kidney disease    per pt pcp note in epic  . IBS (irritable bowel syndrome)   . IBS (irritable bowel syndrome)   . Liddle's syndrome    hypertension treated with potassium sparing diuretic, amiloride  . Meningioma ([place])    left cavernous sinus, MRI 09-16-2015 in epic;  followed by neurologist, dr Jannifer Franklin  . OA (osteoarthritis)   . Right ureteral stone   . Sciatica   . Smokers' cough ([place])    occasionally productive  . Vitamin B12 deficiency   . Vitamin D deficiency     Past Surgical History:  Procedure Laterality Date  . BALLOON DILATION  11/03/2011   Procedure: BALLOON DILATION;  Surgeon: Arta Silence, MD;  Location: [facility];  Service: Endoscopy;  Laterality: N/A;  . CYSTOSCOPY WITH URETEROSCOPY AND STENT PLACEMENT Right 10/09/2018   Procedure: CYSTOSCOPY WITH RIGHT  URETEROSCOPY HOLMIUN LASER , RETROGRADE AND STENT PLACEMENT;  Surgeon: Lucas Mallow, MD;  Location[facility];  Service: Urology;  Laterality: Right;  . TONSILLECTOMY  child  . VAGINAL HYSTERECTOMY  1994    Current Medications: Current Meds  Medication Sig  . acetaminophen (TYLENOL) 650 MG CR tablet Take 1,300 mg by mouth daily.   Marland Kitchen aMILoride (MIDAMOR) 5 MG tablet Take 5 mg by mouth 2 (two) times daily.   Marland Kitchen  ascorbic acid (VITAMIN C) 1000 MG tablet Take 1,000 mg by mouth 2 (two) times daily.  Marland Kitchen aspirin EC 81 MG tablet Take 1 tablet (81 mg total) by mouth daily. Swallow whole.  . Cholecalciferol (VITAMIN D3) 2000 units TABS Take 1 tablet by mouth daily.  . Coenzyme Q10 (CO Q 10) 100 MG CAPS Take 1 capsule by mouth daily.   . Collagen 500 MG CAPS Take 2 capsules by mouth daily.   . Glucosamine HCl-MSM 750-750 MG TABS Take 1 tablet by mouth 2 (two) times daily.  . pravastatin (PRAVACHOL) 40 MG tablet Take 40 mg by mouth at bedtime.  . Probiotic Product (PROBIOTIC DAILY PO) Take by mouth 2 (two) times daily.     Allergies:   Crestor [rosuvastatin],  Fluticasone, Lipitor [atorvastatin], Other, and Zetia [ezetimibe]   Social History   Socioeconomic History  . Marital status: Divorced    Spouse name: Not on file  . Number of children: 1  . Years of education: graduate  . Highest education level: Not on file  Occupational History  . Occupation: retired  Tobacco Use  . Smoking status: Current Every Day Smoker    Packs/day: 1.50    Years: 48.00    Pack years: 72.00    Types: Cigarettes  . Smokeless tobacco: Never Used  . Tobacco comment: 10-04-2018  per pt down to 2-3 cig per day from 1-2 ppd  Vaping Use  . Vaping Use: Never used  Substance and Sexual Activity  . Alcohol use: No  . Drug use: Never  . Sexual activity: Not on file  Other Topics Concern  . Not on file  Social History Narrative   Patient drinks about 2 cups of caffeine daily.   Patient is right handed.   Social Determinants of Health   Financial Resource Strain: Not on file  Food Insecurity: Not on file  Transportation Needs: Not on file  Physical Activity: Not on file  Stress: Not on file  Social Connections: Not on file     Family History: The patient's family history includes Cancer in her father; Healthy in her brother; Heart disease in her mother; Hyperlipidemia in her mother.  ROS:   Please see the history of present illness.    She continues to smoke a few cigarettes daily.  She is concerned about swelling and redness in both of her lower extremities.  Lower extremity swelling has been going on for 2 to 3 years.  It is worse now than previous.  She has not had chills or fever.  She does not have symptoms compatible with claudication.  She does not take her medication with any reliability, missing doses of statin therapy as well as amiloride.  All other systems reviewed and are negative.  EKGs/Labs/Other Studies Reviewed:    The following studies were reviewed today:  NON-CORONARY CHEST CT 01/2020: IMPRESSION: 1. Lung-RADS 2S, benign appearance  or behavior. Continue annual screening with low-dose chest CT without contrast in 12 months. 2. The "S" modifier above refers to potentially clinically significant non lung cancer related findings. Specifically, there is aortic atherosclerosis, in addition to left main and 2 vessel coronary artery disease. Please note that although the presence of coronary artery calcium documents the presence of coronary artery disease, the severity of this disease and any potential stenosis cannot be assessed on this non-gated CT examination. Assessment for potential risk factor modification, dietary therapy or pharmacologic therapy may be warranted, if clinically indicated. 3. Mild diffuse bronchial wall thickening  with mild centrilobular and paraseptal emphysema; imaging findings suggestive of underlying COPD.  Aortic Atherosclerosis (ICD10-I70.0) and Emphysema (ICD10-J43.9).  EKG:  EKG normal sinus rhythm, right atrial abnormality, no evidence of prior infarction.  Without obvious evidence of right ventricular hypertrophy or strain.  Recent Labs: No results found for requested labs within last 8760 hours.  Recent Lipid Panel No results found for: CHOL, TRIG, HDL, CHOLHDL, VLDL, LDLCALC, LDLDIRECT  Physical Exam:    VS:  BP 138/78   Pulse 81   Ht 5\' 3"  (1.6 m)   Wt 125 lb (56.7 kg)   SpO2 95%   BMI 22.14 kg/m     Wt Readings from Last 3 Encounters:  04/29/20 125 lb (56.7 kg)  04/11/19 120 lb (54.4 kg)  10/09/18 125 lb (56.7 kg)     GEN: Appears older than her stated age. No acute distress HEENT: Normal NECK: No JVD. LYMPHATICS: No lymphadenopathy CARDIAC: No murmur. RRR no gallop, but there is bilateral ankle to knee tense edema with superimposed lichenification and erythema and eschar formation in the right greater than left leg anterior space. VASCULAR:  Normal Pulses. No bruits. RESPIRATORY:  Clear to auscultation without rales, wheezing or rhonchi  ABDOMEN: Soft, non-tender,  non-distended, No pulsatile mass, MUSCULOSKELETAL: No deformity  SKIN: Warm and dry NEUROLOGIC:  Alert and oriented x 3 PSYCHIATRIC:  Normal affect   ASSESSMENT:    1. Coronary artery disease involving native coronary artery of native heart without angina pectoris   2. Other secondary pulmonary hypertension ([place])   3. Tobacco user   4. Hyperlipidemia LDL goal <70   5. Centrilobular emphysema ([place])   6. Educated about COVID-19 virus infection   7. Heart failure, type unknown ([place])    PLAN:    In order of problems listed above:  1. Myocardial perfusion imaging with pharmacologic stress to determine significance of coronary disease.  She has coronary disease identified by noncardiac CT scan that demonstrated three-vessel calcification. 2. Suspect significant pulmonary hypertension secondary to COPD.  A BNP and 2D Doppler echocardiogram will be done.  Her neck veins are distended.  EKG does not demonstrate evidence of right ventricular hypertrophy or strain. 3. Still using cigarettes but smoking less than a pack per week. 4. Significant hyperlipidemia with LDL greater than 140 on pravastatin 40 mg/day which is taken intermittently.  No side effects. 5. Severity is not known 21. Educated concerning COVID-19, she is vaccinated, boosted, and practicing mitigation. 7. Diastolic heart failure versus pulmonary hypertension from COPD or some combination of the 2.  BNP will be helpful.  Echo will help to assess LV function.  Plan 1 month follow-up.  Additional therapy and work-up will be dependent upon the accumulating database from the Bmet/BNP/echo/stress nuclear.   Medication Adjustments/Labs and Tests Ordered: Current medicines are reviewed at length with the patient today.  Concerns regarding medicines are outlined above.  Orders Placed This Encounter  Procedures  . Pro b natriuretic peptide  . Basic metabolic panel  . MYOCARDIAL PERFUSION IMAGING  . EKG 12-Lead  . ECHOCARDIOGRAM  COMPLETE   Meds ordered this encounter  Medications  . aspirin EC 81 MG tablet    Sig: Take 1 tablet (81 mg total) by mouth daily. Swallow whole.    Dispense:  90 tablet    Refill:  3    Patient Instructions  Medication Instructions:  1) START Aspirin 81mg  once daily  *If you need a refill on your cardiac medications before your next appointment, please  call your pharmacy*   Lab Work: BMET and Pro BNP today  If you have labs (blood work) drawn today and your tests are completely normal, you will receive your results only by: Marland Kitchen MyChart Message (if you have MyChart) OR . A paper copy in the mail If you have any lab test that is abnormal or we need to change your treatment, we will call you to review the results.   Testing/Procedures: Your physician has requested that you have an echocardiogram. Echocardiography is a painless test that uses sound waves to create images of your heart. It provides your doctor with information about the size and shape of your heart and how well your heart's chambers and valves are working. This procedure takes approximately one hour. There are no restrictions for this procedure.  Your physician has requested that you have a lexiscan myoview. For further information please visit [email or web address]. Please follow instruction sheet, as given.    Follow-Up: At [facility], you and your health needs are our priority.  As part of our continuing mission to provide you with exceptional heart care, we have created designated Provider Care Teams.  These Care Teams include your primary Cardiologist (physician) and Advanced Practice Providers (APPs -  Physician Assistants and Nurse Practitioners) who all work together to provide you with the care you need, when you need it.  We recommend signing up for the patient portal called "MyChart".  Sign up information is provided on this After Visit Summary.  MyChart is used to connect with patients for Virtual Visits  (Telemedicine).  Patients are able to view lab/test results, encounter notes, upcoming appointments, etc.  Non-urgent messages can be sent to your provider as well.   To learn more about what you can do with MyChart, go to [email or web address].    Your next appointment:   1 month(s)  The format for your next appointment:   In Person  Provider:   You may see Dr. Daneen Schick or one of the following Advanced Practice Providers on your designated Care Team:    Kathyrn Drown, NP    Other Instructions      Signed, Sinclair Grooms, MD  04/29/2020 3:16 PM    [place]

## 2020-04-29 ENCOUNTER — Ambulatory Visit (INDEPENDENT_AMBULATORY_CARE_PROVIDER_SITE_OTHER): Payer: Medicare Other | Admitting: Interventional Cardiology

## 2020-04-29 ENCOUNTER — Encounter: Payer: Self-pay | Admitting: Interventional Cardiology

## 2020-04-29 ENCOUNTER — Other Ambulatory Visit: Payer: Self-pay

## 2020-04-29 ENCOUNTER — Encounter: Payer: Self-pay | Admitting: *Deleted

## 2020-04-29 VITALS — BP 138/78 | HR 81 | Ht 63.0 in | Wt 125.0 lb

## 2020-04-29 DIAGNOSIS — Z720 Tobacco use: Secondary | ICD-10-CM | POA: Diagnosis not present

## 2020-04-29 DIAGNOSIS — I509 Heart failure, unspecified: Secondary | ICD-10-CM

## 2020-04-29 DIAGNOSIS — J432 Centrilobular emphysema: Secondary | ICD-10-CM

## 2020-04-29 DIAGNOSIS — I2510 Atherosclerotic heart disease of native coronary artery without angina pectoris: Secondary | ICD-10-CM

## 2020-04-29 DIAGNOSIS — I2729 Other secondary pulmonary hypertension: Secondary | ICD-10-CM | POA: Diagnosis not present

## 2020-04-29 DIAGNOSIS — Z7189 Other specified counseling: Secondary | ICD-10-CM

## 2020-04-29 DIAGNOSIS — E785 Hyperlipidemia, unspecified: Secondary | ICD-10-CM | POA: Diagnosis not present

## 2020-04-29 MED ORDER — ASPIRIN EC 81 MG PO TBEC: 81.0000 mg | DELAYED_RELEASE_TABLET | Freq: Every day | ORAL | 3 refills | Status: AC

## 2020-04-29 NOTE — Patient Instructions (Addendum)
Medication Instructions:  1) START Aspirin 81mg  once daily  *If you need a refill on your cardiac medications before your next appointment, please call your pharmacy*   Lab Work: BMET and Pro BNP today  If you have labs (blood work) drawn today and your tests are completely normal, you will receive your results only by: Marland Kitchen MyChart Message (if you have MyChart) OR . A paper copy in the mail If you have any lab test that is abnormal or we need to change your treatment, we will call you to review the results.   Testing/Procedures: Your physician has requested that you have an echocardiogram. Echocardiography is a painless test that uses sound waves to create images of your heart. It provides your doctor with information about the size and shape of your heart and how well your heart's chambers and valves are working. This procedure takes approximately one hour. There are no restrictions for this procedure.  Your physician has requested that you have a lexiscan myoview. For further information please visit [email or web address]. Please follow instruction sheet, as given.    Follow-Up: At [facility], you and your health needs are our priority.  As part of our continuing mission to provide you with exceptional heart care, we have created designated Provider Care Teams.  These Care Teams include your primary Cardiologist (physician) and Advanced Practice Providers (APPs -  Physician Assistants and Nurse Practitioners) who all work together to provide you with the care you need, when you need it.  We recommend signing up for the patient portal called "MyChart".  Sign up information is provided on this After Visit Summary.  MyChart is used to connect with patients for Virtual Visits (Telemedicine).  Patients are able to view lab/test results, encounter notes, upcoming appointments, etc.  Non-urgent messages can be sent to your provider as well.   To learn more about what you can do with MyChart, go  to [email or web address].    Your next appointment:   1 month(s)- can have 3/7 at 11:40a or 3/8 at 10:20a  The format for your next appointment:   In Person  Provider:   You may see Dr. Daneen Schick or one of the following Advanced Practice Providers on your designated Care Team:    Kathyrn Drown, NP    Other Instructions

## 2020-04-30 LAB — BASIC METABOLIC PANEL
BUN/Creatinine Ratio: 13 (ref 12–28)
BUN: 9 mg/dL (ref 8–27)
CO2: 26 mmol/L (ref 20–29)
Calcium: 10.2 mg/dL (ref 8.7–10.3)
Chloride: 104 mmol/L (ref 96–106)
Creatinine, Ser: 0.7 mg/dL (ref 0.57–1.00)
GFR calc Af Amer: 99 mL/min/{1.73_m2} (ref >59)
GFR calc non Af Amer: 86 mL/min/{1.73_m2} (ref >59)
Glucose: 75 mg/dL (ref 65–99)
Potassium: 4.7 mmol/L (ref 3.5–5.2)
Sodium: 143 mmol/L (ref 134–144)

## 2020-04-30 LAB — PRO B NATRIURETIC PEPTIDE: NT-Pro BNP: 255 pg/mL (ref 0–301)

## 2020-05-01 ENCOUNTER — Encounter (HOSPITAL_COMMUNITY): Payer: Self-pay | Admitting: Interventional Cardiology

## 2020-05-01 DIAGNOSIS — Z9119 Patient's noncompliance with other medical treatment and regimen: Secondary | ICD-10-CM | POA: Diagnosis not present

## 2020-05-01 DIAGNOSIS — I10 Essential (primary) hypertension: Secondary | ICD-10-CM | POA: Diagnosis not present

## 2020-05-14 DIAGNOSIS — J449 Chronic obstructive pulmonary disease, unspecified: Secondary | ICD-10-CM | POA: Diagnosis not present

## 2020-05-14 DIAGNOSIS — I151 Hypertension secondary to other renal disorders: Secondary | ICD-10-CM | POA: Diagnosis not present

## 2020-05-14 DIAGNOSIS — N200 Calculus of kidney: Secondary | ICD-10-CM | POA: Diagnosis not present

## 2020-05-14 DIAGNOSIS — F17210 Nicotine dependence, cigarettes, uncomplicated: Secondary | ICD-10-CM | POA: Diagnosis not present

## 2020-05-14 DIAGNOSIS — M8580 Other specified disorders of bone density and structure, unspecified site: Secondary | ICD-10-CM | POA: Diagnosis not present

## 2020-05-14 DIAGNOSIS — Z6822 Body mass index (BMI) 22.0-22.9, adult: Secondary | ICD-10-CM | POA: Diagnosis not present

## 2020-05-14 DIAGNOSIS — E7800 Pure hypercholesterolemia, unspecified: Secondary | ICD-10-CM | POA: Diagnosis not present

## 2020-05-20 DIAGNOSIS — M8589 Other specified disorders of bone density and structure, multiple sites: Secondary | ICD-10-CM | POA: Diagnosis not present

## 2020-05-22 DIAGNOSIS — R634 Abnormal weight loss: Secondary | ICD-10-CM | POA: Diagnosis not present

## 2020-05-22 DIAGNOSIS — Z713 Dietary counseling and surveillance: Secondary | ICD-10-CM | POA: Diagnosis not present

## 2020-05-22 DIAGNOSIS — Z86010 Personal history of colonic polyps: Secondary | ICD-10-CM | POA: Diagnosis not present

## 2020-05-28 ENCOUNTER — Telehealth (HOSPITAL_COMMUNITY): Payer: Self-pay | Admitting: *Deleted

## 2020-05-28 NOTE — Telephone Encounter (Signed)
Patient given detailed instructions per Myocardial Perfusion Study Information Sheet for the test on 05/30/20 at 1015. Patient notified to arrive 15 minutes early and that it is imperative to arrive on time for appointment to keep from having the test rescheduled.  If you need to cancel or reschedule your appointment, please call the office within 24 hours of your appointment. . Patient verbalized understanding.Saathvik Every, Ranae Palms

## 2020-05-30 ENCOUNTER — Other Ambulatory Visit: Payer: Self-pay

## 2020-05-30 ENCOUNTER — Ambulatory Visit (HOSPITAL_COMMUNITY): Payer: Medicare Other | Attending: Cardiovascular Disease

## 2020-05-30 ENCOUNTER — Ambulatory Visit (HOSPITAL_BASED_OUTPATIENT_CLINIC_OR_DEPARTMENT_OTHER): Payer: Medicare Other

## 2020-05-30 DIAGNOSIS — I509 Heart failure, unspecified: Secondary | ICD-10-CM

## 2020-05-30 DIAGNOSIS — I2510 Atherosclerotic heart disease of native coronary artery without angina pectoris: Secondary | ICD-10-CM | POA: Insufficient documentation

## 2020-05-30 LAB — MYOCARDIAL PERFUSION IMAGING
LV dias vol: 50 mL (ref 46–106)
LV sys vol: 18 mL
Peak HR: 98 {beats}/min
Rest HR: 73 {beats}/min
SDS: 2
SRS: 0
SSS: 2
TID: 0.93

## 2020-05-30 LAB — ECHOCARDIOGRAM COMPLETE
Area-P 1/2: 4.68 cm2
S' Lateral: 2 cm

## 2020-05-30 MED ORDER — REGADENOSON 0.4 MG/5ML IV SOLN
0.4000 mg | Freq: Once | INTRAVENOUS | Status: AC
  Administered 2020-05-30: 0.4 mg via INTRAVENOUS

## 2020-05-30 MED ORDER — TECHNETIUM TC 99M TETROFOSMIN IV KIT
30.9000 | PACK | Freq: Once | INTRAVENOUS | Status: AC | PRN
  Administered 2020-05-30: 30.9 via INTRAVENOUS
  Filled 2020-05-30: qty 31

## 2020-05-30 MED ORDER — TECHNETIUM TC 99M TETROFOSMIN IV KIT
10.1000 | PACK | Freq: Once | INTRAVENOUS | Status: AC | PRN
  Administered 2020-05-30: 10.1 via INTRAVENOUS
  Filled 2020-05-30: qty 11

## 2020-06-05 DIAGNOSIS — R634 Abnormal weight loss: Secondary | ICD-10-CM | POA: Diagnosis not present

## 2020-06-07 NOTE — Progress Notes (Unsigned)
Cardiology Office Note:    Date:  06/12/2020   ID:  Jenny Oliver, DOB [birth year], MRN [id number]  PCP:  Deland Pretty, MD  Cardiologist:  Sinclair Grooms, MD   Referring MD: Deland Pretty, MD   Chief Complaint  Patient presents with  . Coronary Artery Disease  . Hyperlipidemia    History of Present Illness:    Jenny Oliver is a 75 y.o. female with a hx of CAD without angina referred for Cardiology consultation from Dr. Audie Pinto. Recent chest CT with LM and LAD/Cfx calcification.   The recent nuclear study was negative for ischemia.  She has dense calcification on a 9 coronary CTA involving the left main, LAD, and circumflex.  She is not having angina.  She continues to smoke cigarettes.  We went through a detailed conversation concerning risk factor modifications.  Her needed risk modifications are better cholesterol control with LDL less than 70; 150 minutes of moderate activity per week; and smoking cessation (likely the most important).  She is not diabetic, has family history of CAD, is 75 years of age, does not sleep 6 hours per night, and has a terrible diet.  Past Medical History:  Diagnosis Date  . Abnormality of gait    due to balance instability,  episodic  . Benign paroxysmal positional vertigo   . Bilateral lower extremity edema   . Chronic insomnia 04/24/2015  . COPD with emphysema ([place])    last chest CT 01-12-2018  . DDD (degenerative disc disease), lumbosacral   . Full dentures   . GERD (gastroesophageal reflux disease)    no meds  . Headache   . Hiatal hernia   . History of esophageal dilatation    2013 balloon dilatation  . History of Helicobacter pylori infection   . Hypertensive chronic kidney disease with stage 1 through stage 4 chronic kidney disease, or unspecified chronic kidney disease    per pt pcp note in epic  . IBS (irritable bowel syndrome)   . IBS (irritable bowel syndrome)   . Liddle's syndrome    hypertension treated with potassium  sparing diuretic, amiloride  . Meningioma ([place])    left cavernous sinus, MRI 09-16-2015 in epic;  followed by neurologist, dr Jannifer Franklin  . OA (osteoarthritis)   . Right ureteral stone   . Sciatica   . Smokers' cough ([place])    occasionally productive  . Vitamin B12 deficiency   . Vitamin D deficiency     Past Surgical History:  Procedure Laterality Date  . BALLOON DILATION  11/03/2011   Procedure: BALLOON DILATION;  Surgeon: Arta Silence, MD;  Location: [facility];  Service: Endoscopy;  Laterality: N/A;  . CYSTOSCOPY WITH URETEROSCOPY AND STENT PLACEMENT Right 10/09/2018   Procedure: CYSTOSCOPY WITH RIGHT  URETEROSCOPY HOLMIUN LASER , RETROGRADE AND STENT PLACEMENT;  Surgeon: Lucas Mallow, MD;  Location: [facility];  Service: Urology;  Laterality: Right;  . TONSILLECTOMY  child  . VAGINAL HYSTERECTOMY  1994    Current Medications: Current Meds  Medication Sig  . acetaminophen (TYLENOL) 650 MG CR tablet Take 1,300 mg by mouth daily.   Marland Kitchen aMILoride (MIDAMOR) 5 MG tablet Take 5 mg by mouth 2 (two) times daily.   Marland Kitchen aspirin EC 81 MG tablet Take 1 tablet (81 mg total) by mouth daily. Swallow whole.  . Probiotic Product (PROBIOTIC DAILY PO) Take by mouth 2 (two) times daily.  . [DISCONTINUED] pravastatin (PRAVACHOL) 40 MG tablet Take 40 mg  by mouth at bedtime.     Allergies:   Crestor [rosuvastatin], Fluticasone, Lipitor [atorvastatin], Other, and Zetia [ezetimibe]   Social History   Socioeconomic History  . Marital status: Divorced    Spouse name: Not on file  . Number of children: 1  . Years of education: graduate  . Highest education level: Not on file  Occupational History  . Occupation: retired  Tobacco Use  . Smoking status: Current Every Day Smoker    Packs/day: 1.50    Years: 48.00    Pack years: 72.00    Types: Cigarettes  . Smokeless tobacco: Never Used  . Tobacco comment: 10-04-2018  per pt down to 2-3 cig per day from 1-2 ppd  Vaping Use  .  Vaping Use: Never used  Substance and Sexual Activity  . Alcohol use: No  . Drug use: Never  . Sexual activity: Not on file  Other Topics Concern  . Not on file  Social History Narrative   Patient drinks about 2 cups of caffeine daily.   Patient is right handed.   Social Determinants of Health   Financial Resource Strain: Not on file  Food Insecurity: Not on file  Transportation Needs: Not on file  Physical Activity: Not on file  Stress: Not on file  Social Connections: Not on file     Family History: The patient's family history includes Cancer in her father; Healthy in her brother; Heart disease in her mother; Hyperlipidemia in her mother.  ROS:   Please see the history of present illness.    She is charged with caring for her mother who is now nearly 97 years old.  She has difficulty ambulating.  All other systems reviewed and are negative.  EKGs/Labs/Other Studies Reviewed:    The following studies were reviewed today:   Recent LDL was 134.  #[id number]  MYOCARDIAL PERFUSION IMAGING 2021: Study Highlights    The left ventricular ejection fraction is normal (55-65%).  Nuclear stress EF: 65%.  There was no ST segment deviation noted during stress.  The study is normal.  This is a low risk study.   Low risk stress nuclear study with normal perfusion and normal left ventricular regional and global systolic function.  ECHOCARDIOGRAM 2022: IMPRESSIONS  1. Left ventricular ejection fraction, by estimation, is 60 to 65%. The  left ventricle has normal function. The left ventricle has no regional  wall motion abnormalities. There is mild left ventricular hypertrophy.  Left ventricular diastolic parameters  were normal.  2. Right ventricular systolic function is normal. The right ventricular  size is mildly enlarged. There is mildly elevated pulmonary artery  systolic pressure. The estimated right ventricular systolic pressure is  38.1 mmHg.  3. The mitral  valve is normal in structure. No evidence of mitral valve  regurgitation. No evidence of mitral stenosis.  4. The aortic valve is tricuspid. Aortic valve regurgitation is not  visualized. Mild aortic valve sclerosis is present, with no evidence of  aortic valve stenosis.  5. The inferior vena cava is normal in size with greater than 50%  respiratory variability, suggesting right atrial pressure of 3 mmHg.    EKG:  EKG not repeated  Recent Labs: 04/29/2020: BUN 9; Creatinine, Ser 0.70; NT-Pro BNP 255; Potassium 4.7; Sodium 143  Recent Lipid Panel No results found for: CHOL, TRIG, HDL, CHOLHDL, VLDL, LDLCALC, LDLDIRECT  Physical Exam:    VS:  BP 110/60   Pulse 83   Ht 5\' 3"  (1.6 m)  Wt 118 lb 3.2 oz (53.6 kg)   SpO2 92%   BMI 20.94 kg/m     Wt Readings from Last 3 Encounters:  06/12/20 118 lb 3.2 oz (53.6 kg)  04/29/20 125 lb (56.7 kg)  04/11/19 120 lb (54.4 kg)     GEN: Slender and age compatible.. No acute distress HEENT: Normal NECK: No JVD. LYMPHATICS: No lymphadenopathy CARDIAC: No murmur. RRR no gallop, or edema. VASCULAR:  Normal Pulses. No bruits. RESPIRATORY:  Clear to auscultation without rales, wheezing or rhonchi  ABDOMEN: Soft, non-tender, non-distended, No pulsatile mass, MUSCULOSKELETAL: No deformity  SKIN: Warm and dry NEUROLOGIC:  Alert and oriented x 3 PSYCHIATRIC:  Normal affect   ASSESSMENT:    1. Coronary artery disease involving native coronary artery of native heart without angina pectoris   2. Other secondary pulmonary hypertension ([place])   3. Tobacco user   4. Hyperlipidemia LDL goal <70   5. Centrilobular emphysema ([place])    PLAN:    In order of problems listed above:  1. Needs aggressive preventive therapy. No ischemia detected . No further eval unless angina or equivalent.  Preventive measures discussed as listed below. 2. COPD likely cause.  She also has dyspnea on exertion.  She understands this to be a significant problem. 3. She  is unable to declare a quit date for smoking.  She will get with her primary physician, Dr. Shelia Media to develop a game plan. 4. Continue management of lipids but switch from pravastatin which she takes sparingly to rosuvastatin 20 mg every day.  Check liver and lipid panel in 6 to 8 weeks. 5. Smoking cessation encouraged.  Overall education and awareness concerning primary risk prevention was discussed in detail: LDL less than 70, hemoglobin A1c less than 7, blood pressure target less than 130/80 mmHg, >150 minutes of moderate aerobic activity per week, avoidance of smoking, weight control (via diet and exercise), and continued surveillance/management of/for obstructive sleep apnea.  She is not diabetic, she is not exercising, she continues to smoke, lipids elevated.  All were discussed in detail.  Medication Adjustments/Labs and Tests Ordered: Current medicines are reviewed at length with the patient today.  Concerns regarding medicines are outlined above.  No orders of the defined types were placed in this encounter.  No orders of the defined types were placed in this encounter.   There are no Patient Instructions on file for this visit.   Signed, Sinclair Grooms, MD  06/12/2020 2:04 PM    [facility]

## 2020-06-12 ENCOUNTER — Other Ambulatory Visit: Payer: Self-pay

## 2020-06-12 ENCOUNTER — Ambulatory Visit (INDEPENDENT_AMBULATORY_CARE_PROVIDER_SITE_OTHER): Payer: Medicare Other | Admitting: Interventional Cardiology

## 2020-06-12 ENCOUNTER — Encounter: Payer: Self-pay | Admitting: Interventional Cardiology

## 2020-06-12 VITALS — BP 110/60 | HR 83 | Ht 63.0 in | Wt 118.2 lb

## 2020-06-12 DIAGNOSIS — Z720 Tobacco use: Secondary | ICD-10-CM | POA: Diagnosis not present

## 2020-06-12 DIAGNOSIS — J432 Centrilobular emphysema: Secondary | ICD-10-CM

## 2020-06-12 DIAGNOSIS — E785 Hyperlipidemia, unspecified: Secondary | ICD-10-CM

## 2020-06-12 DIAGNOSIS — I2729 Other secondary pulmonary hypertension: Secondary | ICD-10-CM | POA: Diagnosis not present

## 2020-06-12 DIAGNOSIS — I2510 Atherosclerotic heart disease of native coronary artery without angina pectoris: Secondary | ICD-10-CM | POA: Diagnosis not present

## 2020-06-12 MED ORDER — ROSUVASTATIN CALCIUM 20 MG PO TABS: 20.0000 mg | ORAL_TABLET | Freq: Every day | ORAL | 3 refills | Status: DC

## 2020-06-12 NOTE — Patient Instructions (Signed)
Medication Instructions:  1) DISCONTINUE Pravastatin 2) START Rosuvastatin 20mg  once daily  *If you need a refill on your cardiac medications before your next appointment, please call your pharmacy*   Lab Work: Lipid and Liver in 6-8 weeks.  You will need to be fasting for these labs (nothing to eat or drink after midnight except water and black coffee).  If you have labs (blood work) drawn today and your tests are completely normal, you will receive your results only by: Marland Kitchen MyChart Message (if you have MyChart) OR . A paper copy in the mail If you have any lab test that is abnormal or we need to change your treatment, we will call you to review the results.   Testing/Procedures: None   Follow-Up: At [facility], you and your health needs are our priority.  As part of our continuing mission to provide you with exceptional heart care, we have created designated Provider Care Teams.  These Care Teams include your primary Cardiologist (physician) and Advanced Practice Providers (APPs -  Physician Assistants and Nurse Practitioners) who all work together to provide you with the care you need, when you need it.  We recommend signing up for the patient portal called "MyChart".  Sign up information is provided on this After Visit Summary.  MyChart is used to connect with patients for Virtual Visits (Telemedicine).  Patients are able to view lab/test results, encounter notes, upcoming appointments, etc.  Non-urgent messages can be sent to your provider as well.   To learn more about what you can do with MyChart, go to [email or web address].    Your next appointment:   1 year(s)  The format for your next appointment:   In Person  Provider:   You may see Sinclair Grooms, MD or one of the following Advanced Practice Providers on your designated Care Team:    Kathyrn Drown, NP    Other Instructions

## 2020-06-18 DIAGNOSIS — M9901 Segmental and somatic dysfunction of cervical region: Secondary | ICD-10-CM | POA: Diagnosis not present

## 2020-06-18 DIAGNOSIS — M50322 Other cervical disc degeneration at C5-C6 level: Secondary | ICD-10-CM | POA: Diagnosis not present

## 2020-06-18 DIAGNOSIS — M9903 Segmental and somatic dysfunction of lumbar region: Secondary | ICD-10-CM | POA: Diagnosis not present

## 2020-06-18 DIAGNOSIS — M5136 Other intervertebral disc degeneration, lumbar region: Secondary | ICD-10-CM | POA: Diagnosis not present

## 2020-06-23 ENCOUNTER — Telehealth: Payer: Self-pay | Admitting: Interventional Cardiology

## 2020-06-23 DIAGNOSIS — E785 Hyperlipidemia, unspecified: Secondary | ICD-10-CM

## 2020-06-23 DIAGNOSIS — H02834 Dermatochalasis of left upper eyelid: Secondary | ICD-10-CM | POA: Diagnosis not present

## 2020-06-23 DIAGNOSIS — H02831 Dermatochalasis of right upper eyelid: Secondary | ICD-10-CM | POA: Diagnosis not present

## 2020-06-23 DIAGNOSIS — H25813 Combined forms of age-related cataract, bilateral: Secondary | ICD-10-CM | POA: Diagnosis not present

## 2020-06-23 DIAGNOSIS — H5203 Hypermetropia, bilateral: Secondary | ICD-10-CM | POA: Diagnosis not present

## 2020-06-23 NOTE — Telephone Encounter (Signed)
Spoke with patient in regards to inability to take Crestor.  She expressed that a previous provider from [place] prescribed med and she became really sick and could not eat.   She had an OV on 06/12/20 with Dr. Tamala Julian at which time she was switched from pravastatin to Crestor.   I advised her that Dr. Tamala Julian is not in office today and that I would send him and RN a message.  I advised her not to take medication d/t allergy.  She is agreeable to plan.

## 2020-06-23 NOTE — Telephone Encounter (Signed)
Pt c/o medication issue: 1. Name of Medication: crestoro 2. How are you currently taking this medication (dosage and times per day)? 1 time aday 3. Are you having a reaction (difficulty breathing--STAT)?  no 4. What is your medication issue? Patient states she can not this medication.

## 2020-06-23 NOTE — Telephone Encounter (Signed)
Spoke with patient told her of MD referral to lipid clinic.  Made an appointment for 07/10/20 at 3 PM.

## 2020-06-23 NOTE — Telephone Encounter (Signed)
Refer to the [facility] for help and titration of therapy.

## 2020-06-24 DIAGNOSIS — M9901 Segmental and somatic dysfunction of cervical region: Secondary | ICD-10-CM | POA: Diagnosis not present

## 2020-06-24 DIAGNOSIS — M5136 Other intervertebral disc degeneration, lumbar region: Secondary | ICD-10-CM | POA: Diagnosis not present

## 2020-06-24 DIAGNOSIS — M50322 Other cervical disc degeneration at C5-C6 level: Secondary | ICD-10-CM | POA: Diagnosis not present

## 2020-06-24 DIAGNOSIS — M9903 Segmental and somatic dysfunction of lumbar region: Secondary | ICD-10-CM | POA: Diagnosis not present

## 2020-06-25 DIAGNOSIS — M5136 Other intervertebral disc degeneration, lumbar region: Secondary | ICD-10-CM | POA: Diagnosis not present

## 2020-06-25 DIAGNOSIS — M9903 Segmental and somatic dysfunction of lumbar region: Secondary | ICD-10-CM | POA: Diagnosis not present

## 2020-06-25 DIAGNOSIS — M9901 Segmental and somatic dysfunction of cervical region: Secondary | ICD-10-CM | POA: Diagnosis not present

## 2020-06-25 DIAGNOSIS — M50322 Other cervical disc degeneration at C5-C6 level: Secondary | ICD-10-CM | POA: Diagnosis not present

## 2020-06-26 DIAGNOSIS — I10 Essential (primary) hypertension: Secondary | ICD-10-CM | POA: Diagnosis not present

## 2020-06-26 DIAGNOSIS — R7303 Prediabetes: Secondary | ICD-10-CM | POA: Diagnosis not present

## 2020-07-01 DIAGNOSIS — M5136 Other intervertebral disc degeneration, lumbar region: Secondary | ICD-10-CM | POA: Diagnosis not present

## 2020-07-01 DIAGNOSIS — M50322 Other cervical disc degeneration at C5-C6 level: Secondary | ICD-10-CM | POA: Diagnosis not present

## 2020-07-01 DIAGNOSIS — M9901 Segmental and somatic dysfunction of cervical region: Secondary | ICD-10-CM | POA: Diagnosis not present

## 2020-07-01 DIAGNOSIS — M9903 Segmental and somatic dysfunction of lumbar region: Secondary | ICD-10-CM | POA: Diagnosis not present

## 2020-07-10 ENCOUNTER — Ambulatory Visit (INDEPENDENT_AMBULATORY_CARE_PROVIDER_SITE_OTHER): Payer: Medicare Other | Admitting: Pharmacist

## 2020-07-10 ENCOUNTER — Other Ambulatory Visit: Payer: Self-pay

## 2020-07-10 DIAGNOSIS — I2510 Atherosclerotic heart disease of native coronary artery without angina pectoris: Secondary | ICD-10-CM | POA: Diagnosis not present

## 2020-07-10 DIAGNOSIS — I150 Renovascular hypertension: Secondary | ICD-10-CM | POA: Insufficient documentation

## 2020-07-10 DIAGNOSIS — Z91199 Patient's noncompliance with other medical treatment and regimen due to unspecified reason: Secondary | ICD-10-CM | POA: Insufficient documentation

## 2020-07-10 DIAGNOSIS — F17210 Nicotine dependence, cigarettes, uncomplicated: Secondary | ICD-10-CM | POA: Diagnosis not present

## 2020-07-10 DIAGNOSIS — E7800 Pure hypercholesterolemia, unspecified: Secondary | ICD-10-CM | POA: Insufficient documentation

## 2020-07-10 DIAGNOSIS — M791 Myalgia, unspecified site: Secondary | ICD-10-CM

## 2020-07-10 DIAGNOSIS — T466X5A Adverse effect of antihyperlipidemic and antiarteriosclerotic drugs, initial encounter: Secondary | ICD-10-CM | POA: Diagnosis not present

## 2020-07-10 DIAGNOSIS — R600 Localized edema: Secondary | ICD-10-CM | POA: Insufficient documentation

## 2020-07-10 DIAGNOSIS — K449 Diaphragmatic hernia without obstruction or gangrene: Secondary | ICD-10-CM | POA: Insufficient documentation

## 2020-07-10 DIAGNOSIS — E538 Deficiency of other specified B group vitamins: Secondary | ICD-10-CM | POA: Insufficient documentation

## 2020-07-10 DIAGNOSIS — N200 Calculus of kidney: Secondary | ICD-10-CM | POA: Insufficient documentation

## 2020-07-10 DIAGNOSIS — E785 Hyperlipidemia, unspecified: Secondary | ICD-10-CM | POA: Insufficient documentation

## 2020-07-10 DIAGNOSIS — I10 Essential (primary) hypertension: Secondary | ICD-10-CM | POA: Insufficient documentation

## 2020-07-10 DIAGNOSIS — N182 Chronic kidney disease, stage 2 (mild): Secondary | ICD-10-CM | POA: Insufficient documentation

## 2020-07-10 DIAGNOSIS — Z9119 Patient's noncompliance with other medical treatment and regimen: Secondary | ICD-10-CM | POA: Insufficient documentation

## 2020-07-10 DIAGNOSIS — J439 Emphysema, unspecified: Secondary | ICD-10-CM | POA: Insufficient documentation

## 2020-07-10 DIAGNOSIS — R7303 Prediabetes: Secondary | ICD-10-CM | POA: Insufficient documentation

## 2020-07-10 DIAGNOSIS — M8000XA Age-related osteoporosis with current pathological fracture, unspecified site, initial encounter for fracture: Secondary | ICD-10-CM | POA: Insufficient documentation

## 2020-07-10 DIAGNOSIS — M8580 Other specified disorders of bone density and structure, unspecified site: Secondary | ICD-10-CM | POA: Insufficient documentation

## 2020-07-10 DIAGNOSIS — I152 Hypertension secondary to endocrine disorders: Secondary | ICD-10-CM | POA: Insufficient documentation

## 2020-07-10 DIAGNOSIS — Z8719 Personal history of other diseases of the digestive system: Secondary | ICD-10-CM | POA: Insufficient documentation

## 2020-07-10 NOTE — Patient Instructions (Addendum)
It was nice meeting you today!  We would like your LDL (bad cholesterol) to be less than 70  Continue your diet changes.  Keep eating your salads and avoiding chips and sodas  Try to increase your exercise to 30 minutes a day at least 5 days a week  We will start a new medication that you will inject once every 2 weeks  We will check your cholesterol in about 2 -3 months  Please call with any questions!  Karren Cobble, PharmD, BCACP, [place], [place] [address] [place], [place] [address] Phone: [phone]; Fax: [phone] 07/10/2020 3:38 PM

## 2020-07-10 NOTE — Progress Notes (Signed)
Patient ID: Jenny Oliver                 DOB: May 24, 1945                    MRN: [id number]     HPI: Jenny Oliver is a 75 y.o. female patient referred to lipid clinic by Dr Tamala Julian. PMH is significant for CAD, smoking, and significant family history of CAD.  Seen by Dr Tamala Julian on 3/17 and switched to Crestor which she then reported she could not tolerate.  Restarted pravastatin 80mg  but decided to take 40mg  BID for less side effects.  Patient presents today in good spirits.  Reports a strong family history of CAD and a long smoking history.  She reports she has a few puffs from a cigarette and then throws the rest out. Unclear how much she is actually smoking.  Reports multiple GI issues such as acid reflux and IBS which she believes is one of the reasons she has multiple medication intolerances.  Does not follow an exercise routine however she cares for her bedridden mother and has to walk up the stairs frequently.    Since last visit with Dr Tamala Julian has been working on diet changes.  Was previously drinking 4 pepsis a day and eating a large bag of potato chips as a meal.  Has now cut down to one soda and is trying to drink more water and eat more salads.  Snacks on grapes and peanut butter.  Current Medications: pravastatin 40mg  BID  Intolerances:  rosuvastatin, atorvastatain Risk Factors: CAD, family history, smoking LDL goal: <70  Social History: 10 cigas every 2-3 days, no EtOH  Labs:  TC 205, Trigs 96, HDL 52, LDL 134 (12/21) on pravastatin  Past Medical History:  Diagnosis Date  . Abnormality of gait    due to balance instability,  episodic  . Benign paroxysmal positional vertigo   . Bilateral lower extremity edema   . Chronic insomnia 04/24/2015  . COPD with emphysema ([place])    last chest CT 01-12-2018  . DDD (degenerative disc disease), lumbosacral   . Full dentures   . GERD (gastroesophageal reflux disease)    no meds  . Headache   . Hiatal hernia   . History of esophageal  dilatation    2013 balloon dilatation  . History of Helicobacter pylori infection   . Hypertensive chronic kidney disease with stage 1 through stage 4 chronic kidney disease, or unspecified chronic kidney disease    per pt pcp note in epic  . IBS (irritable bowel syndrome)   . IBS (irritable bowel syndrome)   . Liddle's syndrome    hypertension treated with potassium sparing diuretic, amiloride  . Meningioma ([place])    left cavernous sinus, MRI 09-16-2015 in epic;  followed by neurologist, dr Jannifer Franklin  . OA (osteoarthritis)   . Right ureteral stone   . Sciatica   . Smokers' cough ([place])    occasionally productive  . Vitamin B12 deficiency   . Vitamin D deficiency     Current Outpatient Medications on File Prior to Visit  Medication Sig Dispense Refill  . acetaminophen (TYLENOL) 650 MG CR tablet Take 1,300 mg by mouth daily.     Marland Kitchen aMILoride (MIDAMOR) 5 MG tablet Take 5 mg by mouth 2 (two) times daily.     Marland Kitchen aspirin EC 81 MG tablet Take 1 tablet (81 mg total) by mouth daily. Swallow whole. 90 tablet 3  .  Probiotic Product (PROBIOTIC DAILY PO) Take by mouth 2 (two) times daily.    . rosuvastatin (CRESTOR) 20 MG tablet Take 1 tablet (20 mg total) by mouth daily. 90 tablet 3   No current facility-administered medications on file prior to visit.    Allergies  Allergen Reactions  . Crestor [Rosuvastatin]   . Fluticasone Dermatitis  . Lipitor [Atorvastatin]   . Other     Per pt avoids Bread, peanuts/tree nuts due to bloating  opioids causes nausea and vomiting Lactose intolerance  . Zetia [Ezetimibe]     Assessment/Plan:  1. Hyperlipidemia - Patient LDL 134 which is above goal of <70 while on pravastatin 80mg .  Intolerant to high intensity statins.    Commended patient on positive diet changes such as reducing sugary beverages and snack foods and increasing water intake and salads.    Recommended patient increase her physical activity. Explained that waling up the stairs and  caring for her mother was not exercise and and she should increase physical activity to at least 30 minutes a day at least 5 days a week.    Patient will need 50% reduction to reach LDL goal.  Recommended PCSK9i.  Using demo pen, counseled patient on storage, site selection, and administration.  Patient was able to demopnstrate in room.  Will complete PA and contact patient.  Will likely need grant assistance.  Karren Cobble, PharmD, BCACP, [place], [place] [address], [place], [place] [address] Phone: [phone]; Fax: [phone] 07/10/2020 6:00 PM

## 2020-07-14 ENCOUNTER — Telehealth: Payer: Self-pay

## 2020-07-14 MED ORDER — PRALUENT 150 MG/ML ~~LOC~~ SOAJ: 150.0000 mg | SUBCUTANEOUS | 11 refills | Status: DC

## 2020-07-14 NOTE — Telephone Encounter (Signed)
Called and spoke w/pt stated [praluent approved and rx sent pt already scheduled for labs and got the pt approved for [facility] and emailed the information to the pt. Pt voiced understanding

## 2020-07-29 ENCOUNTER — Other Ambulatory Visit: Payer: Medicare Other

## 2020-08-05 DIAGNOSIS — R634 Abnormal weight loss: Secondary | ICD-10-CM | POA: Diagnosis not present

## 2020-08-05 DIAGNOSIS — F17210 Nicotine dependence, cigarettes, uncomplicated: Secondary | ICD-10-CM | POA: Diagnosis not present

## 2020-09-02 ENCOUNTER — Other Ambulatory Visit: Payer: Medicare Other

## 2020-09-02 ENCOUNTER — Other Ambulatory Visit: Payer: Self-pay

## 2020-09-02 DIAGNOSIS — I2510 Atherosclerotic heart disease of native coronary artery without angina pectoris: Secondary | ICD-10-CM | POA: Diagnosis not present

## 2020-09-02 DIAGNOSIS — E785 Hyperlipidemia, unspecified: Secondary | ICD-10-CM

## 2020-09-03 DIAGNOSIS — R0781 Pleurodynia: Secondary | ICD-10-CM | POA: Diagnosis not present

## 2020-09-03 DIAGNOSIS — R109 Unspecified abdominal pain: Secondary | ICD-10-CM | POA: Diagnosis not present

## 2020-09-03 DIAGNOSIS — M791 Myalgia, unspecified site: Secondary | ICD-10-CM | POA: Diagnosis not present

## 2020-09-03 LAB — LIPID PANEL
Chol/HDL Ratio: 2.4 ratio (ref 0.0–4.4)
Cholesterol, Total: 133 mg/dL (ref 100–199)
HDL: 55 mg/dL (ref >39)
LDL Chol Calc (NIH): 63 mg/dL (ref 0–99)
Triglycerides: 75 mg/dL (ref 0–149)
VLDL Cholesterol Cal: 15 mg/dL (ref 5–40)

## 2020-09-03 LAB — HEPATIC FUNCTION PANEL
ALT: 13 IU/L (ref 0–32)
AST: 18 IU/L (ref 0–40)
Albumin: 4.2 g/dL (ref 3.7–4.7)
Alkaline Phosphatase: 103 IU/L (ref 44–121)
Bilirubin Total: 0.6 mg/dL (ref 0.0–1.2)
Bilirubin, Direct: 0.19 mg/dL (ref 0.00–0.40)
Total Protein: 6.7 g/dL (ref 6.0–8.5)

## 2020-09-11 DIAGNOSIS — R7303 Prediabetes: Secondary | ICD-10-CM | POA: Diagnosis not present

## 2020-09-11 DIAGNOSIS — E7800 Pure hypercholesterolemia, unspecified: Secondary | ICD-10-CM | POA: Diagnosis not present

## 2020-09-18 DIAGNOSIS — J349 Unspecified disorder of nose and nasal sinuses: Secondary | ICD-10-CM | POA: Diagnosis not present

## 2020-09-18 DIAGNOSIS — D72819 Decreased white blood cell count, unspecified: Secondary | ICD-10-CM | POA: Diagnosis not present

## 2020-09-18 DIAGNOSIS — E538 Deficiency of other specified B group vitamins: Secondary | ICD-10-CM | POA: Diagnosis not present

## 2020-09-18 DIAGNOSIS — K219 Gastro-esophageal reflux disease without esophagitis: Secondary | ICD-10-CM | POA: Diagnosis not present

## 2020-09-18 DIAGNOSIS — F17210 Nicotine dependence, cigarettes, uncomplicated: Secondary | ICD-10-CM | POA: Diagnosis not present

## 2020-09-18 DIAGNOSIS — I10 Essential (primary) hypertension: Secondary | ICD-10-CM | POA: Diagnosis not present

## 2020-09-18 DIAGNOSIS — I151 Hypertension secondary to other renal disorders: Secondary | ICD-10-CM | POA: Diagnosis not present

## 2020-09-18 DIAGNOSIS — F5104 Psychophysiologic insomnia: Secondary | ICD-10-CM | POA: Diagnosis not present

## 2020-09-18 DIAGNOSIS — I2510 Atherosclerotic heart disease of native coronary artery without angina pectoris: Secondary | ICD-10-CM | POA: Diagnosis not present

## 2020-09-18 DIAGNOSIS — Z0001 Encounter for general adult medical examination with abnormal findings: Secondary | ICD-10-CM | POA: Diagnosis not present

## 2020-09-18 DIAGNOSIS — E785 Hyperlipidemia, unspecified: Secondary | ICD-10-CM | POA: Diagnosis not present

## 2020-11-13 ENCOUNTER — Other Ambulatory Visit: Payer: Self-pay

## 2020-11-13 ENCOUNTER — Encounter: Payer: Self-pay | Admitting: Podiatrist

## 2020-11-13 ENCOUNTER — Ambulatory Visit (INDEPENDENT_AMBULATORY_CARE_PROVIDER_SITE_OTHER): Payer: Medicare Other | Admitting: Podiatrist

## 2020-11-13 DIAGNOSIS — L851 Acquired keratosis [keratoderma] palmaris et plantaris: Secondary | ICD-10-CM | POA: Diagnosis not present

## 2020-11-13 DIAGNOSIS — M2042 Other hammer toe(s) (acquired), left foot: Secondary | ICD-10-CM | POA: Diagnosis not present

## 2020-11-13 DIAGNOSIS — M2041 Other hammer toe(s) (acquired), right foot: Secondary | ICD-10-CM | POA: Diagnosis not present

## 2020-11-13 DIAGNOSIS — M216X9 Other acquired deformities of unspecified foot: Secondary | ICD-10-CM

## 2020-11-13 DIAGNOSIS — L84 Corns and callosities: Secondary | ICD-10-CM | POA: Diagnosis not present

## 2020-11-13 DIAGNOSIS — I2510 Atherosclerotic heart disease of native coronary artery without angina pectoris: Secondary | ICD-10-CM

## 2020-11-13 DIAGNOSIS — D72819 Decreased white blood cell count, unspecified: Secondary | ICD-10-CM | POA: Insufficient documentation

## 2020-11-13 DIAGNOSIS — Z0001 Encounter for general adult medical examination with abnormal findings: Secondary | ICD-10-CM | POA: Insufficient documentation

## 2020-11-13 NOTE — Progress Notes (Signed)
Chief Complaint  Patient presents with   Callouses    F/U BL callus care at BL feet Tx: trimming with no relief     HPI: Patient is 75 y.o. female who presents today for painful calluses of bilateral feet. She relates pain with shoes and with ambulation.  She soaks her feet and tries to trim them with a pedicure tool with no relief. She relates pain with walking due to the calluses.     Current Outpatient Medications on File Prior to Visit  Medication Sig Dispense Refill   acetaminophen (TYLENOL) 650 MG CR tablet Take 1,300 mg by mouth daily.      Alirocumab (PRALUENT) 150 MG/ML SOAJ Inject 150 mg into the skin every 14 (fourteen) days. 2 mL 11   aMILoride (MIDAMOR) 5 MG tablet Take 5 mg by mouth 2 (two) times daily.      aspirin EC 81 MG tablet Take 1 tablet (81 mg total) by mouth daily. Swallow whole. 90 tablet 3   GLUCOSAMINE-CHONDROITIN DS PO Take 1 tablet by mouth in the morning and at bedtime.     nirmatrelvir & ritonavir (PAXLOVID, 300/100,) 20 x 150 MG & 10 x '100MG'$  TBPK See admin instructions.     pravastatin (PRAVACHOL) 40 MG tablet Take 40 mg by mouth in the morning and at bedtime.     Probiotic Product (PROBIOTIC DAILY PO) Take by mouth 2 (two) times daily. (Patient not taking: Reported on 07/10/2020)     varenicline (CHANTIX PAK) 0.5 MG X 11 & 1 MG X 42 tablet See admin instructions.     No current facility-administered medications on file prior to visit.    Allergies  Allergen Reactions   Alpha Blocker Quinazolines Other (See Comments)   Clindamycin Other (See Comments)   Fluticasone Dermatitis and Other (See Comments)   Guaifenesin Er Other (See Comments)   Panama Bread [Wolfiporia Cocos] Other (See Comments)   Lipitor [Atorvastatin] Nausea And Vomiting   Loratadine Other (See Comments)   Milk-Related Compounds Other (See Comments)   Other     Per pt avoids Bread, peanuts/tree nuts due to bloating  opioids causes nausea and vomiting Lactose intolerance    Peanut-Containing Drug Products Other (See Comments)   Rosuvastatin Nausea Only and Other (See Comments)   Zetia [Ezetimibe]     Review of Systems No fevers, chills, nausea, muscle aches, no difficulty breathing, no calf pain, no chest pain or shortness of breath.   Physical Exam  GENERAL APPEARANCE: Alert, conversant. Appropriately groomed. No acute distress.   VASCULAR: Pedal pulses palpable DP and PT bilateral.  Capillary refill time is immediate to all digits,  Proximal to distal cooling it warm to warm.  Digital perfusion adequate.   NEUROLOGIC: sensation is intact to 5.07 monofilament at 5/5 sites bilateral.  Light touch is intact bilateral, vibratory sensation intact bilateral  MUSCULOSKELETAL: acceptable muscle strength, tone and stability bilateral.  Left fifth toe is contracting causing prominence of the fifth metatarsal head right.  Prominent fifth metatarsal head right is also noted.    DERMATOLOGIC: skin is warm, supple, and dry.  Multiple large intractable keratosis are noted on both feet:  right submet 1 and 5 as well as medial right heel,  left submet 5 as well.  Lesions are deeply enucleated with a ground glass base.  No bleeding encountered.   Assessment     ICD-10-CM   1. Heloma durum  L84     2. Acquired plantar porokeratosis  L85.1  3. Corn or callus  L84     4. Hammer toes of both feet  M20.41    M20.42     5. Prominent metatarsal head, unspecified laterality  M21.6X9        Plan  Discussed treatment options and recommended gel type padding for her forefoot especially under the fifth metatarsal heads where the lesions are the worst.  Also pared the lesions x 3 with a 15 blade to a safe level without complication. Smoothed the heel calluses with a power burr.  Recommended continued soaks and moisturizing and periodic debridement.

## 2020-11-13 NOTE — Patient Instructions (Signed)
Get some gel forefoot pads for your feet to try-  Ive seen them at [employer]  For Jenny Oliver-  try neosporin plus pain for around the toenails Massage her ankles with cream when they swell May want to try a gel pad or moseskin behind the heels for padding-  keep using the heel cradles too.    Aspercream or voltaren gel might also be helpful.

## 2020-12-24 DIAGNOSIS — N390 Urinary tract infection, site not specified: Secondary | ICD-10-CM | POA: Diagnosis not present

## 2020-12-24 DIAGNOSIS — R300 Dysuria: Secondary | ICD-10-CM | POA: Diagnosis not present

## 2021-01-16 DIAGNOSIS — Z23 Encounter for immunization: Secondary | ICD-10-CM | POA: Diagnosis not present

## 2021-02-18 ENCOUNTER — Ambulatory Visit: Payer: Medicare Other | Admitting: Podiatry

## 2021-02-27 DIAGNOSIS — R350 Frequency of micturition: Secondary | ICD-10-CM | POA: Diagnosis not present

## 2021-02-27 DIAGNOSIS — Z87442 Personal history of urinary calculi: Secondary | ICD-10-CM | POA: Diagnosis not present

## 2021-02-27 DIAGNOSIS — R300 Dysuria: Secondary | ICD-10-CM | POA: Diagnosis not present

## 2021-02-27 DIAGNOSIS — R3915 Urgency of urination: Secondary | ICD-10-CM | POA: Diagnosis not present

## 2021-02-27 DIAGNOSIS — R310 Gross hematuria: Secondary | ICD-10-CM | POA: Diagnosis not present

## 2021-03-04 DIAGNOSIS — M9904 Segmental and somatic dysfunction of sacral region: Secondary | ICD-10-CM | POA: Diagnosis not present

## 2021-03-04 DIAGNOSIS — M5136 Other intervertebral disc degeneration, lumbar region: Secondary | ICD-10-CM | POA: Diagnosis not present

## 2021-03-04 DIAGNOSIS — M9903 Segmental and somatic dysfunction of lumbar region: Secondary | ICD-10-CM | POA: Diagnosis not present

## 2021-03-04 DIAGNOSIS — M9905 Segmental and somatic dysfunction of pelvic region: Secondary | ICD-10-CM | POA: Diagnosis not present

## 2021-03-10 DIAGNOSIS — M9904 Segmental and somatic dysfunction of sacral region: Secondary | ICD-10-CM | POA: Diagnosis not present

## 2021-03-10 DIAGNOSIS — M5136 Other intervertebral disc degeneration, lumbar region: Secondary | ICD-10-CM | POA: Diagnosis not present

## 2021-03-10 DIAGNOSIS — M9903 Segmental and somatic dysfunction of lumbar region: Secondary | ICD-10-CM | POA: Diagnosis not present

## 2021-03-10 DIAGNOSIS — M9905 Segmental and somatic dysfunction of pelvic region: Secondary | ICD-10-CM | POA: Diagnosis not present

## 2021-04-15 DIAGNOSIS — L819 Disorder of pigmentation, unspecified: Secondary | ICD-10-CM | POA: Diagnosis not present

## 2021-04-15 DIAGNOSIS — M7989 Other specified soft tissue disorders: Secondary | ICD-10-CM | POA: Diagnosis not present

## 2021-04-19 DIAGNOSIS — Z20822 Contact with and (suspected) exposure to covid-19: Secondary | ICD-10-CM | POA: Diagnosis not present

## 2021-04-19 DIAGNOSIS — R079 Chest pain, unspecified: Secondary | ICD-10-CM | POA: Diagnosis not present

## 2021-04-19 DIAGNOSIS — R918 Other nonspecific abnormal finding of lung field: Secondary | ICD-10-CM | POA: Diagnosis not present

## 2021-04-19 DIAGNOSIS — K219 Gastro-esophageal reflux disease without esophagitis: Secondary | ICD-10-CM | POA: Diagnosis not present

## 2021-04-23 ENCOUNTER — Other Ambulatory Visit: Payer: Self-pay | Admitting: *Deleted

## 2021-04-23 DIAGNOSIS — Z87891 Personal history of nicotine dependence: Secondary | ICD-10-CM

## 2021-04-23 DIAGNOSIS — F17210 Nicotine dependence, cigarettes, uncomplicated: Secondary | ICD-10-CM

## 2021-05-11 ENCOUNTER — Other Ambulatory Visit: Payer: Self-pay

## 2021-05-11 ENCOUNTER — Ambulatory Visit
Admission: RE | Admit: 2021-05-11 | Discharge: 2021-05-11 | Disposition: A | Discharge status: Home / Self Care | Payer: Medicare Other | Source: Ambulatory Visit | Attending: Acute Care | Admitting: Acute Care

## 2021-05-11 DIAGNOSIS — Z87891 Personal history of nicotine dependence: Secondary | ICD-10-CM

## 2021-05-11 DIAGNOSIS — F17210 Nicotine dependence, cigarettes, uncomplicated: Secondary | ICD-10-CM

## 2021-05-13 ENCOUNTER — Other Ambulatory Visit: Payer: Self-pay

## 2021-05-13 DIAGNOSIS — Z87891 Personal history of nicotine dependence: Secondary | ICD-10-CM

## 2021-05-13 DIAGNOSIS — F17210 Nicotine dependence, cigarettes, uncomplicated: Secondary | ICD-10-CM

## 2021-05-18 DIAGNOSIS — J439 Emphysema, unspecified: Secondary | ICD-10-CM | POA: Diagnosis not present

## 2021-05-18 DIAGNOSIS — L03115 Cellulitis of right lower limb: Secondary | ICD-10-CM | POA: Diagnosis not present

## 2021-05-18 DIAGNOSIS — F17210 Nicotine dependence, cigarettes, uncomplicated: Secondary | ICD-10-CM | POA: Diagnosis not present

## 2021-06-05 ENCOUNTER — Encounter: Payer: Self-pay | Admitting: Pulmonary Disease

## 2021-06-05 ENCOUNTER — Other Ambulatory Visit: Payer: Self-pay

## 2021-06-05 ENCOUNTER — Ambulatory Visit (INDEPENDENT_AMBULATORY_CARE_PROVIDER_SITE_OTHER): Payer: Medicare Other | Admitting: Pulmonary Disease

## 2021-06-05 VITALS — BP 118/68 | HR 71 | Ht 62.5 in | Wt 117.0 lb

## 2021-06-05 DIAGNOSIS — Z720 Tobacco use: Secondary | ICD-10-CM

## 2021-06-05 DIAGNOSIS — F17210 Nicotine dependence, cigarettes, uncomplicated: Secondary | ICD-10-CM | POA: Diagnosis not present

## 2021-06-05 DIAGNOSIS — J439 Emphysema, unspecified: Secondary | ICD-10-CM

## 2021-06-05 MED ORDER — VARENICLINE TARTRATE (STARTER) 0.5 MG X 11 & 1 MG X 42 PO TBPK: ORAL_TABLET | ORAL | 0 refills | Status: AC

## 2021-06-05 MED ORDER — SPIRIVA RESPIMAT 2.5 MCG/ACT IN AERS: 2.0000 | INHALATION_SPRAY | Freq: Every day | RESPIRATORY_TRACT | 0 refills | Status: DC

## 2021-06-05 MED ORDER — SPIRIVA RESPIMAT 2.5 MCG/ACT IN AERS: 2.0000 | INHALATION_SPRAY | Freq: Every day | RESPIRATORY_TRACT | 5 refills | Status: DC

## 2021-06-05 NOTE — Progress Notes (Signed)
Subjective:   PATIENT ID: Jenny Oliver GENDER: female DOB: [birth year], MRN: [id number]   HPI  Chief Complaint  Patient presents with   Consult    sob    Reason for Visit: New consult for emphysema  Jenny Oliver is a 76 year old female active smoker with COPD, allergic rhinitis, GERD, hyperlipidemia, osteoarthritis Liddle's syndrome who presents as a new patient for emphysema.  She has been referred by her PCP Dr. Shelia Media for pulmonary evaluation.  She was seen on 05/18/2021.  Advised to quit smoking and prescribed Proventil HFA.  She is using her rescue inhaler 1-2 times a day with improvement in her shortness of breath associated with chest tightness. Also improves with rest. Occasional non-productive cough. Triggered by cold air. Her symptoms have worsening in the last six months with more frequent shortness of breath. Denies wheezing and no nocturnal symptoms.   Social History: Active smoker. Smokes 1/4 ppd. 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  Past Medical History:  Diagnosis Date   Abnormality of gait    due to balance instability,  episodic   Benign paroxysmal positional vertigo    Bilateral lower extremity edema    Chronic insomnia 04/24/2015   COPD with emphysema ([place])    last chest CT 01-12-2018   DDD (degenerative disc disease), lumbosacral    Full dentures    GERD (gastroesophageal reflux disease)    no meds   Headache    Hiatal hernia    History of esophageal dilatation    2013 balloon dilatation   History of Helicobacter pylori infection    Hypertensive chronic kidney disease with stage 1 through stage 4 chronic kidney disease, or unspecified chronic kidney disease    per pt pcp note in epic   IBS (irritable bowel syndrome)    IBS (irritable bowel syndrome)    Liddle's syndrome    hypertension treated with potassium sparing  diuretic, amiloride   Meningioma (HCC)    left cavernous sinus, MRI 09-16-2015 in epic;  followed by neurologist, dr Jannifer Franklin   OA (osteoarthritis)    Right ureteral stone    Sciatica    Smokers' cough ([place])    occasionally productive   Vitamin B12 deficiency    Vitamin D deficiency      Family History  Problem Relation Age of Onset   Heart disease Mother    Hyperlipidemia Mother    Cancer Father        stomach   Healthy Brother      Social History   Occupational History   Occupation: retired  Tobacco Use   Smoking status: Every Day    Packs/day: 1.50    Years: 48.00    Pack years: 72.00    Types: Cigarettes   Smokeless tobacco: Never   Tobacco comments:    06/05/21 down to 0.5 pack    10-04-2018  per pt down to 2-3 cig per day from 1-2 ppd  Vaping Use   Vaping Use: Never used  Substance and Sexual Activity   Alcohol use: No   Drug use: Never   Sexual activity: Not on file    Allergies  Allergen Reactions   Alpha Blocker Quinazolines Other (See Comments)   Clindamycin Other (See Comments)   Fluticasone Dermatitis and Other (See Comments)   Guaifenesin Er Other (See Comments)   Pan[place] Bread [Wolfiporia Cocos] Other (See Comments)   Lipitor [  Atorvastatin] Nausea And Vomiting   Loratadine Other (See Comments)   Milk-Related Compounds Other (See Comments)   Other     Per pt avoids Bread, peanuts/tree nuts due to bloating  opioids causes nausea and vomiting Lactose intolerance   Peanut-Containing Drug Products Other (See Comments)   Rosuvastatin Nausea Only and Other (See Comments)   Zetia [Ezetimibe]      Outpatient Medications Prior to Visit  Medication Sig Dispense Refill   acetaminophen (TYLENOL) 650 MG CR tablet Take 1,300 mg by mouth daily.      albuterol (VENTOLIN HFA) 108 (90 Base) MCG/ACT inhaler 2 puffs     Alirocumab (PRALUENT) 150 MG/ML SOAJ Inject 150 mg into the skin every 14 (fourteen) days. 2 mL 11   aMILoride (MIDAMOR) 5 MG tablet Take 5 mg  by mouth 2 (two) times daily.      aspirin EC 81 MG tablet Take 1 tablet (81 mg total) by mouth daily. Swallow whole. 90 tablet 3   Coenzyme Q10 (CO Q 10) 10 MG CAPS      GLUCOSAMINE-CHONDROITIN DS PO Take 1 tablet by mouth in the morning and at bedtime.     nirmatrelvir & ritonavir (PAXLOVID, 300/100,) 20 x 150 MG & 10 x '100MG'$  TBPK See admin instructions. (Patient not taking: Reported on 06/05/2021)     pravastatin (PRAVACHOL) 40 MG tablet Take 40 mg by mouth in the morning and at bedtime.     Probiotic Product (PROBIOTIC DAILY PO) Take by mouth 2 (two) times daily.     varenicline (CHANTIX PAK) 0.5 MG X 11 & 1 MG X 42 tablet See admin instructions.     No facility-administered medications prior to visit.    Review of Systems  Constitutional:  Negative for chills, diaphoresis, fever, malaise/fatigue and weight loss.  HENT:  Negative for congestion.   Respiratory:  Positive for cough and shortness of breath. Negative for hemoptysis, sputum production and wheezing.   Cardiovascular:  Negative for chest pain, palpitations and leg swelling.    Objective:   Vitals:   06/05/21 1435  BP: 118/68  Pulse: 71  SpO2: 94%  Weight: 117 lb (53.1 kg)  Height: 5' 2.5" (1.588 m)   SpO2: 94 % O2 Device: None (Room air)  Physical Exam: General: Well-appearing, no acute distress HENT: [place], AT Eyes: EOMI, no scleral icterus Respiratory: Diminished breath sounds bilaterally.  No crackles, wheezing or rales Cardiovascular: RRR, -M/R/G, no JVD Extremities:-Edema,-tenderness Neuro: AAO x4, CNII-XII grossly intact Psych: Normal mood, normal affect   Data Reviewed:  Imaging: CT lung screening 05/11/2021-moderate centrilobular emphysema with diffuse bronchial wall thickening.  No growth or interval of previously seen pulmonary nodules  PFT: 05/13/2016 FVC 2.11 (93%) FEV1 1.48 (84%) Ratio 68  Interpretation: Mild obstructive defect is present.  No significant bronchodilator response however does  not preclude the use of bronchodilators.  F-V loops suggestive of obstructive defect  Labs: OSH absolute eosinophils 09/17/2020-100     Assessment & Plan:   Discussion: 76 year old female active smoker with COPD, allergic rhinitis, GERD, hyperlipidemia, osteoarthritis Liddle's syndrome who presents as a new patient for emphysema. I have reviewed PFTs which demonstrate obstructive defect. Discussed clinical course and management of COPD including smoking cessation, bronchodilator regimen and action plan for exacerbation.  Emphysema --START Spiriva 2.5 mcg TWO puffs ONCE a day --CONTINUE Proventil TWO puffs AS NEEDED for shortness of breath or wheezing  Tobacco abuse Patient is an active smoker. Failed wellbutrin. Previously had success with chantix. Will prescribe Chantix  We discussed smoking cessation for 5 minutes. We discussed triggers and stressors and ways to deal with them. We discussed barriers to continued smoking and benefits of smoking cessation. Provided patient with information cessation techniques and interventions including [place] quitline.   Health Maintenance Immunization History  Administered Date(s) Administered   Pneumococcal Conjugate-13 11/26/2016   Pneumococcal Polysaccharide-23 11/28/2007   CT Lung Screen - enrolled  No orders of the defined types were placed in this encounter.  Meds ordered this encounter  Medications   Tiotropium Bromide Monohydrate (SPIRIVA RESPIMAT) 2.5 MCG/ACT AERS    Sig: Inhale 2 puffs into the lungs daily.    Dispense:  4 g    Refill:  5   Varenicline Tartrate, Starter, 0.5 MG X 11 & 1 MG X 42 TBPK    Sig: Take 0.5 mg by mouth daily at 2 PM for 3 days, THEN 0.5 mg in the morning and at bedtime for 3 days, THEN 1 mg daily.    Dispense:  53 each    Refill:  0    Return in about 3 months (around 09/05/2021).  I have spent a total time of 45-minutes on the day of the appointment reviewing prior documentation, coordinating care and  discussing medical diagnosis and plan with the patient/family. Imaging, labs and tests included in this note have been reviewed and interpreted independently by me.  [place], MD [facility] 06/05/2021 3:19 PM  Office Number [phone]

## 2021-06-05 NOTE — Patient Instructions (Addendum)
?  Emphysema ?--START Spiriva 2.5 mcg TWO puffs ONCE a day ?--CONTINUE Proventil TWO puffs AS NEEDED for shortness of breath or wheezing ? ?Tobacco abuse ?Patient is an active smoker. Failed wellbutrin. Previously had success with chantix. Will prescribe Chantix ?We discussed smoking cessation for 5 minutes. We discussed triggers and stressors and ways to deal with them. We discussed barriers to continued smoking and benefits of smoking cessation. Provided patient with information cessation techniques and interventions including [place] quitline. ? ?Follow-up with me in 3 months ?

## 2021-06-09 ENCOUNTER — Telehealth: Payer: Self-pay | Admitting: Pharmacy Technician

## 2021-06-09 ENCOUNTER — Other Ambulatory Visit (HOSPITAL_COMMUNITY): Payer: Self-pay

## 2021-06-09 NOTE — Telephone Encounter (Signed)
Received notification from [facility]) regarding a prior authorization for VARENICLINE STARTER PK. Authorization has been APPROVED from 1.1.23 to 9.10.23.  ? ?Per test claim, copay for 30 days supply is $0 ? ? ?Authorization # [id number] ? ? ?

## 2021-06-09 NOTE — Telephone Encounter (Signed)
Patient Advocate Encounter ? ?Received notification from [facility]) that prior authorization for VARENICLINE STARTER PK) is required. ?  ?PA submitted on 3.14.23 ?Key [id number]  ?Status is pending ?  ?[facility] will continue to follow ? ?Jameila Keeny R Jerome Otter, CPhT ?Patient Advocate ?Phone: [phone] ?Fax:  [phone] ? ?

## 2021-07-28 ENCOUNTER — Other Ambulatory Visit: Payer: Self-pay | Admitting: Interventional Cardiology

## 2021-08-19 NOTE — Progress Notes (Unsigned)
Cardiology Office Note:    Date:  08/20/2021   ID:  Jenny Oliver, DOB 02-Oct-1945, MRN [id number]  PCP:  Deland Pretty, MD  Cardiologist:  Sinclair Grooms, MD   Referring MD: Deland Pretty, MD   Chief Complaint  Patient presents with   Follow-up    Pulmonary hypertension, secondary Emphysema/COPD Coronary artery disease without angina    History of Present Illness:    Jenny Oliver is a 76 y.o. female with a hx of CAD without angina referred for Cardiology consultation from Dr. Audie Pinto. Recent chest CT with LM and LAD/Cfx calcification.  Other problems include secondary pulmonary hypertension, and emphysema/COPD.  She has COPD.  Still smoking cigarettes.  No chest discomfort.  Has dense calcification in coronary arteries and aorta on CT scan for cancer screening done this year.  Denies orthopnea, PND, lower extremity swelling, and palpitations  Past Medical History:  Diagnosis Date   Abnormality of gait    due to balance instability,  episodic   Benign paroxysmal positional vertigo    Bilateral lower extremity edema    Chronic insomnia 04/24/2015   COPD with emphysema ([place])    last chest CT 01-12-2018   DDD (degenerative disc disease), lumbosacral    Full dentures    GERD (gastroesophageal reflux disease)    no meds   Headache    Hiatal hernia    History of esophageal dilatation    2013 balloon dilatation   History of Helicobacter pylori infection    Hypertensive chronic kidney disease with stage 1 through stage 4 chronic kidney disease, or unspecified chronic kidney disease    per pt pcp note in epic   IBS (irritable bowel syndrome)    IBS (irritable bowel syndrome)    Liddle's syndrome    hypertension treated with potassium sparing diuretic, amiloride   Meningioma (HCC)    left cavernous sinus, MRI 09-16-2015 in epic;  followed by neurologist, dr Jannifer Franklin   OA (osteoarthritis)    Right ureteral stone    Sciatica    Smokers' cough ([place])    occasionally  productive   Vitamin B12 deficiency    Vitamin D deficiency     Past Surgical History:  Procedure Laterality Date   BALLOON DILATION  11/03/2011   Procedure: BALLOON DILATION;  Surgeon: Arta Silence, MD;  Location: [facility];  Service: Endoscopy;  Laterality: N/A;   CYSTOSCOPY WITH URETEROSCOPY AND STENT PLACEMENT Right 10/09/2018   Procedure: CYSTOSCOPY WITH RIGHT  URETEROSCOPY HOLMIUN LASER , RETROGRADE AND STENT PLACEMENT;  Surgeon: Lucas Mallow, MD;  Location[facility];  Service: Urology;  Laterality: Right;   TONSILLECTOMY  child   VAGINAL HYSTERECTOMY  1994    Current Medications: No outpatient medications have been marked as taking for the 08/20/21 encounter (Office Visit) with Belva Crome, MD.     Allergies:   Alpha blocker quinazolines, Clindamycin, Fluticasone, Guaifenesin er, [place] bread [wolfiporia cocos], Lipitor [atorvastatin], Loratadine, Milk-related compounds, Other, Peanut-containing drug products, Rosuvastatin, and Zetia [ezetimibe]   Social History   Socioeconomic History   Marital status: Divorced    Spouse name: Not on file   Number of children: 1   Years of education: graduate   Highest education level: Not on file  Occupational History   Occupation: retired  Tobacco Use   Smoking status: Every Day    Packs/day: 1.50    Years: 48.00    Pack years: 72.00    Types: Cigarettes  Smokeless tobacco: Never   Tobacco comments:    06/05/21 down to 0.5 pack    10-04-2018  per pt down to 2-3 cig per day from 1-2 ppd  Vaping Use   Vaping Use: Never used  Substance and Sexual Activity   Alcohol use: No   Drug use: Never   Sexual activity: Not on file  Other Topics Concern   Not on file  Social History Narrative   Patient drinks about 2 cups of caffeine daily.   Patient is right handed.   Social Determinants of Health   Financial Resource Strain: Not on file  Food Insecurity: Not on file  Transportation Needs: Not on file   Physical Activity: Not on file  Stress: Not on file  Social Connections: Not on file     Family History: The patient's family history includes Cancer in her father; Healthy in her brother; Heart disease in her mother; Hyperlipidemia in her mother.  ROS:   Please see the history of present illness.    Intermittent right hand numbness.  No facial or right lower extremity issues.  All other systems reviewed and are negative.  EKGs/Labs/Other Studies Reviewed:    The following studies were reviewed today: CT screening for lung cancer May 11, 2021: IMPRESSION: 1. Lung-RADS 2, benign appearance or behavior. Continue annual screening with low-dose chest CT without contrast in 12 months. 2. Two-vessel coronary atherosclerosis. 3. Small hiatal hernia. 4. Aortic Atherosclerosis (ICD10-I70.0) and Emphysema (ICD10-J43.9).   EKG:  EKG normal sinus rhythm, low voltage, probable biatrial abnormality, no abnormality.  Compared to prior, performed on April 29, 2020, significant changes noted.  Recent Labs: 09/02/2020: ALT 13  Recent Lipid Panel    Component Value Date/Time   CHOL 133 09/02/2020 1455   TRIG 75 09/02/2020 1455   HDL 55 09/02/2020 1455   CHOLHDL 2.4 09/02/2020 1455   LDLCALC 63 09/02/2020 1455    Physical Exam:    VS:  Ht 5' 2.5" (1.588 m)   BMI 21.06 kg/m     Wt Readings from Last 3 Encounters:  06/05/21 117 lb (53.1 kg)  06/12/20 118 lb 3.2 oz (53.6 kg)  04/29/20 125 lb (56.7 kg)     GEN: In almost cachectic.  Weight is down 8 pounds since February 2022.  Stable weight since March 2022.. No acute distress HEENT: Normal NECK: No JVD. LYMPHATICS: No lymphadenopathy CARDIAC: No murmur. RRR no gallop, or edema. VASCULAR:  Normal Pulses. No bruits. RESPIRATORY:  Clear to auscultation without rales, wheezing or rhonchi  ABDOMEN: Soft, non-tender, non-distended, No pulsatile mass, MUSCULOSKELETAL: No deformity  SKIN: Warm and dry NEUROLOGIC:  Alert and  oriented x 3 PSYCHIATRIC:  Normal affect   ASSESSMENT:    1. Coronary artery disease involving native coronary artery of native heart without angina pectoris   2. Hyperlipidemia LDL goal <70   3. Cigarette smoker   4. Centrilobular emphysema ([place])   5. Other secondary pulmonary hypertension ([place])    PLAN:    In order of problems listed above:  Secondary prevention covered as noted below.  Encouraged smoking cessation. Continue Alirocumab.  LDL target less than 70. Smoking cessation.  No commitment on the patient's part. Worsening with smoking Worsening with emphysema.  Continue current CV preventative measures which are coated aspirin 81 mg daily and Praluent.  LDL less than 70 is at target.  Overall education and awareness concerning secondary risk prevention was discussed in detail: LDL less than 70, hemoglobin A1c less than 7,  blood pressure target less than 130/80 mmHg, >150 minutes of moderate aerobic activity per week, avoidance of smoking, weight control (via diet and exercise), and continued surveillance/management of/for obstructive sleep apnea.    Medication Adjustments/Labs and Tests Ordered: Current medicines are reviewed at length with the patient today.  Concerns regarding medicines are outlined above.  No orders of the defined types were placed in this encounter.  No orders of the defined types were placed in this encounter.   There are no Patient Instructions on file for this visit.   Signed, Sinclair Grooms, MD  08/20/2021 2:08 PM     [facility]

## 2021-08-20 ENCOUNTER — Ambulatory Visit (INDEPENDENT_AMBULATORY_CARE_PROVIDER_SITE_OTHER): Payer: Medicare Other | Admitting: Interventional Cardiology

## 2021-08-20 ENCOUNTER — Encounter: Payer: Self-pay | Admitting: Interventional Cardiology

## 2021-08-20 VITALS — BP 132/78 | HR 79 | Ht 62.5 in | Wt 117.8 lb

## 2021-08-20 DIAGNOSIS — I2510 Atherosclerotic heart disease of native coronary artery without angina pectoris: Secondary | ICD-10-CM | POA: Diagnosis not present

## 2021-08-20 DIAGNOSIS — E785 Hyperlipidemia, unspecified: Secondary | ICD-10-CM

## 2021-08-20 DIAGNOSIS — F17210 Nicotine dependence, cigarettes, uncomplicated: Secondary | ICD-10-CM | POA: Diagnosis not present

## 2021-08-20 DIAGNOSIS — I2729 Other secondary pulmonary hypertension: Secondary | ICD-10-CM

## 2021-08-20 DIAGNOSIS — J432 Centrilobular emphysema: Secondary | ICD-10-CM | POA: Diagnosis not present

## 2021-08-20 NOTE — Patient Instructions (Signed)

## 2021-10-23 ENCOUNTER — Ambulatory Visit: Payer: Medicare Other | Admitting: Pulmonary Disease

## 2021-11-18 DIAGNOSIS — M5136 Other intervertebral disc degeneration, lumbar region: Secondary | ICD-10-CM | POA: Diagnosis not present

## 2021-11-18 DIAGNOSIS — Z86010 Personal history of colonic polyps: Secondary | ICD-10-CM | POA: Diagnosis not present

## 2021-11-18 DIAGNOSIS — K921 Melena: Secondary | ICD-10-CM | POA: Diagnosis not present

## 2021-11-18 DIAGNOSIS — K219 Gastro-esophageal reflux disease without esophagitis: Secondary | ICD-10-CM | POA: Diagnosis not present

## 2021-11-18 DIAGNOSIS — M9903 Segmental and somatic dysfunction of lumbar region: Secondary | ICD-10-CM | POA: Diagnosis not present

## 2021-11-18 DIAGNOSIS — M50322 Other cervical disc degeneration at C5-C6 level: Secondary | ICD-10-CM | POA: Diagnosis not present

## 2021-11-18 DIAGNOSIS — R1310 Dysphagia, unspecified: Secondary | ICD-10-CM | POA: Diagnosis not present

## 2021-11-18 DIAGNOSIS — M9901 Segmental and somatic dysfunction of cervical region: Secondary | ICD-10-CM | POA: Diagnosis not present

## 2021-11-20 ENCOUNTER — Ambulatory Visit: Payer: Medicare Other | Admitting: Pulmonary Disease

## 2021-11-25 DIAGNOSIS — M5136 Other intervertebral disc degeneration, lumbar region: Secondary | ICD-10-CM | POA: Diagnosis not present

## 2021-11-25 DIAGNOSIS — M9901 Segmental and somatic dysfunction of cervical region: Secondary | ICD-10-CM | POA: Diagnosis not present

## 2021-11-25 DIAGNOSIS — M50322 Other cervical disc degeneration at C5-C6 level: Secondary | ICD-10-CM | POA: Diagnosis not present

## 2021-11-25 DIAGNOSIS — M9903 Segmental and somatic dysfunction of lumbar region: Secondary | ICD-10-CM | POA: Diagnosis not present

## 2021-12-03 DIAGNOSIS — H02831 Dermatochalasis of right upper eyelid: Secondary | ICD-10-CM | POA: Diagnosis not present

## 2021-12-03 DIAGNOSIS — H02834 Dermatochalasis of left upper eyelid: Secondary | ICD-10-CM | POA: Diagnosis not present

## 2021-12-03 DIAGNOSIS — H25813 Combined forms of age-related cataract, bilateral: Secondary | ICD-10-CM | POA: Diagnosis not present

## 2021-12-03 DIAGNOSIS — H52203 Unspecified astigmatism, bilateral: Secondary | ICD-10-CM | POA: Diagnosis not present

## 2021-12-03 DIAGNOSIS — H5203 Hypermetropia, bilateral: Secondary | ICD-10-CM | POA: Diagnosis not present

## 2021-12-21 ENCOUNTER — Ambulatory Visit (INDEPENDENT_AMBULATORY_CARE_PROVIDER_SITE_OTHER): Payer: Medicare Other | Admitting: Podiatry

## 2021-12-21 DIAGNOSIS — M79671 Pain in right foot: Secondary | ICD-10-CM

## 2021-12-21 DIAGNOSIS — Q828 Other specified congenital malformations of skin: Secondary | ICD-10-CM | POA: Diagnosis not present

## 2021-12-21 DIAGNOSIS — M79672 Pain in left foot: Secondary | ICD-10-CM

## 2021-12-26 ENCOUNTER — Other Ambulatory Visit: Payer: Self-pay

## 2021-12-26 ENCOUNTER — Encounter (HOSPITAL_BASED_OUTPATIENT_CLINIC_OR_DEPARTMENT_OTHER): Payer: Self-pay | Admitting: Emergency Medicine

## 2021-12-26 ENCOUNTER — Encounter (HOSPITAL_COMMUNITY): Payer: Self-pay | Admitting: *Deleted

## 2021-12-26 ENCOUNTER — Encounter: Payer: Self-pay | Admitting: Podiatry

## 2021-12-26 ENCOUNTER — Ambulatory Visit (HOSPITAL_COMMUNITY)
Admission: EM | Admit: 2021-12-26 | Discharge: 2021-12-26 | Disposition: A | Discharge status: Other Acute Inpatient Hospital | Payer: Medicare Other

## 2021-12-26 ENCOUNTER — Emergency Department (HOSPITAL_BASED_OUTPATIENT_CLINIC_OR_DEPARTMENT_OTHER)
Admission: EM | Admit: 2021-12-26 | Discharge: 2021-12-27 | Disposition: A | Discharge status: Home / Self Care | Payer: Medicare Other | Attending: Emergency Medicine | Admitting: Emergency Medicine

## 2021-12-26 DIAGNOSIS — T43611A Poisoning by caffeine, accidental (unintentional), initial encounter: Secondary | ICD-10-CM | POA: Diagnosis not present

## 2021-12-26 DIAGNOSIS — Z5321 Procedure and treatment not carried out due to patient leaving prior to being seen by health care provider: Secondary | ICD-10-CM | POA: Diagnosis not present

## 2021-12-26 DIAGNOSIS — R42 Dizziness and giddiness: Secondary | ICD-10-CM | POA: Diagnosis not present

## 2021-12-26 LAB — COMPREHENSIVE METABOLIC PANEL
ALT: 11 U/L (ref 0–44)
AST: 17 U/L (ref 15–41)
Albumin: 4.5 g/dL (ref 3.5–5.0)
Alkaline Phosphatase: 71 U/L (ref 38–126)
Anion gap: 14 (ref 5–15)
BUN: 10 mg/dL (ref 8–23)
CO2: 19 mmol/L — ABNORMAL LOW (ref 22–32)
Calcium: 9.9 mg/dL (ref 8.9–10.3)
Chloride: 106 mmol/L (ref 98–111)
Creatinine, Ser: 0.77 mg/dL (ref 0.44–1.00)
GFR, Estimated: >60 mL/min (ref >60)
Glucose, Bld: 87 mg/dL (ref 70–99)
Potassium: 4 mmol/L (ref 3.5–5.1)
Sodium: 139 mmol/L (ref 135–145)
Total Bilirubin: 0.7 mg/dL (ref 0.3–1.2)
Total Protein: 6.9 g/dL (ref 6.5–8.1)

## 2021-12-26 LAB — CBC WITH DIFFERENTIAL/PLATELET
Abs Immature Granulocytes: 0.01 10*3/uL (ref 0.00–0.07)
Basophils Absolute: 0 10*3/uL (ref 0.0–0.1)
Basophils Relative: 0 %
Eosinophils Absolute: 0 10*3/uL (ref 0.0–0.5)
Eosinophils Relative: 0 %
HCT: 41.9 % (ref 36.0–46.0)
Hemoglobin: 14.6 g/dL (ref 12.0–15.0)
Immature Granulocytes: 0 %
Lymphocytes Relative: 31 %
Lymphs Abs: 2 10*3/uL (ref 0.7–4.0)
MCH: 33.8 pg (ref 26.0–34.0)
MCHC: 34.8 g/dL (ref 30.0–36.0)
MCV: 97 fL (ref 80.0–100.0)
Monocytes Absolute: 0.3 10*3/uL (ref 0.1–1.0)
Monocytes Relative: 4 %
Neutro Abs: 4.2 10*3/uL (ref 1.7–7.7)
Neutrophils Relative %: 65 %
Platelets: 296 10*3/uL (ref 150–400)
RBC: 4.32 MIL/uL (ref 3.87–5.11)
RDW: 12.6 % (ref 11.5–15.5)
WBC: 6.5 10*3/uL (ref 4.0–10.5)
nRBC: 0 % (ref 0.0–0.2)

## 2021-12-26 LAB — RAPID URINE DRUG SCREEN, HOSP PERFORMED
Amphetamines: NOT DETECTED
Barbiturates: NOT DETECTED
Benzodiazepines: NOT DETECTED
Cocaine: NOT DETECTED
Opiates: NOT DETECTED
Tetrahydrocannabinol: NOT DETECTED

## 2021-12-26 LAB — CBG MONITORING, ED: Glucose-Capillary: 85 mg/dL (ref 70–99)

## 2021-12-26 LAB — ACETAMINOPHEN LEVEL: Acetaminophen (Tylenol), Serum: <10 ug/mL — ABNORMAL LOW (ref 10–30)

## 2021-12-26 LAB — SALICYLATE LEVEL: Salicylate Lvl: <7 mg/dL — ABNORMAL LOW (ref 7.0–30.0)

## 2021-12-26 LAB — ETHANOL: Alcohol, Ethyl (B): <10 mg/dL (ref <10)

## 2021-12-26 NOTE — ED Provider Triage Note (Signed)
Emergency Medicine Provider Triage Evaluation Note  Jenny Oliver , a 76 y.o. female  was evaluated in triage.  Pt complains of caffeine od. Took 10 -15 caffeine pills last night to stay awake. 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 Feels washed out no cp  Review of Systems  Positive:  Negative:   Physical Exam  BP 123/84   Pulse 98   Temp 98 F (36.7 C) (Oral)   Resp 20   SpO2 98%  Gen:   Awake, no distress   Resp:  Normal effort  MSK:   Moves extremities without difficulty  Other:  No tahcycardia  Medical Decision Making  Medically screening exam initiated at 5:38 PM.  Appropriate orders placed.  KYRI SHADER was informed that the remainder of the evaluation will be completed by another provider, this initial triage assessment does not replace that evaluation, and the importance of remaining in the ED until their evaluation is complete.  Work up initiated.   Margarita Mail, PA-C 12/26/21 1740

## 2021-12-26 NOTE — Progress Notes (Signed)
  Subjective:  Patient ID: Jenny Oliver, female    DOB: 17-Sep-1945,  MRN: [id number]  Chief Complaint  Patient presents with   Callouses    Bil callus debridement PCP-[facility] PCP VST-Jan or Feb 2023    FAIGA STONES presents to clinic today for painful porokeratotic lesions b/l lower extremities. Pain prevent(s) comfortable ambulation. Aggravating factor is weightbearing with and without shoegear.  New problem(s): None.   PCP is Deland Pretty, MD , and last visit was  May 18, 2021.  Allergies  Allergen Reactions   Alpha Blocker Quinazolines Other (See Comments)   Clindamycin Other (See Comments)   Fluticasone Dermatitis and Other (See Comments)   Guaifenesin Er Other (See Comments)   [place] Bread [Wolfiporia Cocos] Other (See Comments)   Lipitor [Atorvastatin] Nausea And Vomiting   Loratadine Other (See Comments)   Milk-Related Compounds Other (See Comments)   Other     Per pt avoids Bread, peanuts/tree nuts due to bloating  opioids causes nausea and vomiting Lactose intolerance   Peanut-Containing Drug Products Other (See Comments)   Rosuvastatin Nausea Only and Other (See Comments)   Zetia [Ezetimibe]     Review of Systems: Negative except as noted in the HPI.  Objective: No changes noted in today's physical examination.  Jenny Oliver is a pleasant 76 y.o. female WD, WN in NAD. AAO x 3.  Vascular Examination: Capillary refill time immediate b/l.Vascular status intact b/l with palpable pedal pulses. Pedal hair absent b/l. No edema. No pain with calf compression b/l. Skin temperature gradient WNL b/l. No edema noted b/l LE.  Neurological Examination: Sensation grossly intact b/l with 10 gram monofilament. Vibratory sensation intact b/l.   Dermatological Examination: Pedal skin with normal turgor, texture and tone b/l.  No open wounds b/l LE. No interdigital macerations noted b/l LE. Toenail(s) 1-5 bilaterally mycotic with adequate length. Porokeratotic lesion(s)  medial DIPJ of L 2nd toe, R 5th toe, submet head 1 left foot, and submet head 5 b/l. No erythema, no edema, no drainage, no fluctuance.  Musculoskeletal Examination: Normal muscle strength 5/5 to all lower extremity muscle groups bilaterally. HAV with bunion bilaterally and hammertoes 2-5 b/l.Marland Kitchen No pain, crepitus or joint limitation noted with ROM b/l LE.  Patient ambulates independently without assistive aids.  Radiographs: None  Assessment/Plan: 1. Porokeratosis   2. Pain in both feet     No orders of the defined types were placed in this encounter.  -Patient was evaluated and treated. All patient's and/or POA's questions/concerns answered on today's visit. -Medicare ABN signed for services of paring of corn(s)/callus(es)/porokeratos(es). Copy in patient chart. -Patient to continue soft, supportive shoe gear daily. -Porokeratotic lesion(s) L 2nd toe, R 5th toe, submet head 1 left foot, and submet head 5 b/l pared and enucleated with sterile currette without incident. Total number of lesions debrided=5. -Patient/POA to call should there be question/concern in the interim.   Return in about 3 months (around 03/22/2022).  Marzetta Board, DPM

## 2021-12-26 NOTE — ED Triage Notes (Signed)
Pt reports taking 10-15 caffeine pills at 0300 . She reports feeling dizzy ,feels generally unwell

## 2021-12-26 NOTE — ED Triage Notes (Addendum)
Pt reports taking 10-15 caffeine pill at 0300. Pt reports brand was Jet-Alert each tab has 100 mg  of caffeine. Pt reports she does not feel good. Pt had tried to sleep but can not. 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

## 2021-12-26 NOTE — ED Notes (Signed)
Santiago Glad S.NP in room for eval.

## 2021-12-26 NOTE — ED Provider Notes (Signed)
[place]    CSN: [id number] Arrival date & time: 12/26/21  1539      History   Chief Complaint No chief complaint on file.   HPI Jenny Oliver is a 76 y.o. female.   Pt reports she took between 10 and 15 caffeine pills. Name of pills is Jet alert.  Pills contain '100mg'$  of caffeine in each pill.  Pt report she took to try to stay awake to take care of her mother.  Pt has a history of little syndrome and she frequently has low potassium.  Pt complains of feeling bad.  Pt reports she is shakey and nauseated   The history is provided by the patient. No language interpreter was used.    Past Medical History:  Diagnosis Date   Abnormality of gait    due to balance instability,  episodic   Benign paroxysmal positional vertigo    Bilateral lower extremity edema    Chronic insomnia 04/24/2015   COPD with emphysema ([place])    last chest CT 01-12-2018   DDD (degenerative disc disease), lumbosacral    Full dentures    GERD (gastroesophageal reflux disease)    no meds   Headache    Hiatal hernia    History of esophageal dilatation    2013 balloon dilatation   History of Helicobacter pylori infection    Hypertensive chronic kidney disease with stage 1 through stage 4 chronic kidney disease, or unspecified chronic kidney disease    per pt pcp note in epic   IBS (irritable bowel syndrome)    IBS (irritable bowel syndrome)    Liddle's syndrome    hypertension treated with potassium sparing diuretic, amiloride   Meningioma (HCC)    left cavernous sinus, MRI 09-16-2015 in epic;  followed by neurologist, dr Jannifer Franklin   OA (osteoarthritis)    Right ureteral stone    Sciatica    Smokers' cough ([place])    occasionally productive   Vitamin B12 deficiency    Vitamin D deficiency     Patient Active Problem List   Diagnosis Date Noted   Encounter for general adult medical examination with abnormal findings 11/13/2020   Leukopenia 11/13/2020   Myalgia due to statin 07/10/2020    Hyperlipidemia 07/10/2020   Atherosclerotic heart disease of native coronary artery without angina pectoris 07/10/2020   Pulmonary emphysema ([place]) 07/10/2020   Chronic kidney disease, stage 2 (mild) 07/10/2020   Cigarette smoker 07/10/2020   Edema of lower extremity 07/10/2020   Essential hypertension 07/10/2020   Hiatal hernia 07/10/2020   History of gastrointestinal disease 07/10/2020   Hypercalcemia 07/10/2020   Hypertension secondary to endocrine disorders 07/10/2020   Nephrolithiasis 07/10/2020   Noncompliance with treatment 07/10/2020   Osteopenia 07/10/2020   Osteoporosis with current pathological fracture 07/10/2020   Prediabetes 07/10/2020   Pure hypercholesterolemia 07/10/2020   Renovascular hypertension 07/10/2020   Vitamin B12 deficiency (non anemic) 07/10/2020   Venous stasis of both lower extremities 04/11/2019   Bilateral hand pain 10/04/2018   Mass of finger of right hand 10/04/2018   Nail deformity 10/04/2018   Headache 08/27/2015   Benign paroxysmal positional vertigo 04/24/2015   Chronic insomnia 04/24/2015   Gait disorder 11/06/2014   Tobacco user 09/08/2013   Allergic rhinitis 09/08/2013   Lipid disorder 09/08/2013   Gastroesophageal reflux disease 09/08/2013   Caregiver stress 09/08/2013    Past Surgical History:  Procedure Laterality Date   BALLOON DILATION  11/03/2011   Procedure: BALLOON DILATION;  Surgeon:  Arta Silence, MD;  Location: [facility];  Service: Endoscopy;  Laterality: N/A;   CYSTOSCOPY WITH URETEROSCOPY AND STENT PLACEMENT Right 10/09/2018   Procedure: CYSTOSCOPY WITH RIGHT  URETEROSCOPY HOLMIUN LASER , RETROGRADE AND STENT PLACEMENT;  Surgeon: Lucas Mallow, MD;  Location: [facility];  Service: Urology;  Laterality: Right;   TONSILLECTOMY  child   VAGINAL HYSTERECTOMY  1994    OB History   No obstetric history on file.      Home Medications    Prior to Admission medications   Medication Sig Start Date  End Date Taking? Authorizing Provider  acetaminophen (TYLENOL) 650 MG CR tablet Take 1,300 mg by mouth daily.     [provider]  albuterol (VENTOLIN HFA) 108 (90 Base) MCG/ACT inhaler 2 puffs 05/18/21   [provider]  aMILoride (MIDAMOR) 5 MG tablet Take 5 mg by mouth 2 (two) times daily.     [provider]  aspirin EC 81 MG tablet Take 1 tablet (81 mg total) by mouth daily. Swallow whole. 04/29/20   Belva Crome, MD  Coenzyme Q10 (CO Q 10) 10 MG CAPS daily.    [provider]  GLUCOSAMINE-CHONDROITIN DS PO Take 1 tablet by mouth in the morning and at bedtime.    [provider]  PRALUENT 150 MG/ML SOAJ INJECT 150 MG UNDER THE SKIN EVERY 14 DAYS 07/28/21   Belva Crome, MD  Tiotropium Bromide Monohydrate (SPIRIVA RESPIMAT) 2.5 MCG/ACT AERS Inhale 2 puffs into the lungs daily. 06/05/21   Margaretha Seeds, MD    Family History Family History  Problem Relation Age of Onset   Heart disease Mother    Hyperlipidemia Mother    Cancer Father        stomach   Healthy Brother     Social History Social History   Tobacco Use   Smoking status: Every Day    Packs/day: 1.50    Years: 48.00    Total pack years: 72.00    Types: Cigarettes   Smokeless tobacco: Never   Tobacco comments:    06/05/21 down to 0.5 pack    10-04-2018  per pt down to 2-3 cig per day from 1-2 ppd  Vaping Use   Vaping Use: Never used  Substance Use Topics   Alcohol use: No   Drug use: Never     Allergies   Alpha blocker quinazolines, Clindamycin, Fluticasone, Guaifenesin er, [place] bread [wolfiporia cocos], Lipitor [atorvastatin], Loratadine, Milk-related compounds, Other, Peanut-containing drug products, Rosuvastatin, and Zetia [ezetimibe]   Review of Systems Review of Systems  All other systems reviewed and are negative.    Physical Exam Triage Vital Signs ED Triage Vitals  Enc Vitals Group     BP      Pulse      Resp      Temp      Temp src       SpO2      Weight      Height      Head Circumference      Peak Flow      Pain Score      Pain Loc      Pain Edu?      Excl. in [place]?    No data found.  Updated Vital Signs There were no vitals taken for this visit.  Visual Acuity Right Eye Distance:   Left Eye Distance:   Bilateral Distance:    Right Eye Near:  Left Eye Near:    Bilateral Near:     Physical Exam Vitals and nursing note reviewed.  Constitutional:      Appearance: She is well-developed.  HENT:     Head: Normocephalic.  Cardiovascular:     Rate and Rhythm: Normal rate.  Pulmonary:     Effort: Pulmonary effort is normal.  Abdominal:     General: There is no distension.  Musculoskeletal:        General: Normal range of motion.     Cervical back: Normal range of motion.  Neurological:     Mental Status: She is alert and oriented to person, place, and time.      UC Treatments / Results  Labs (all labs ordered are listed, but only abnormal results are displayed) Labs Reviewed - No data to display  EKG   Radiology No results found.  Procedures Procedures (including critical care time)  Medications Ordered in UC Medications - No data to display  Initial Impression / Assessment and Plan / UC Course  I have reviewed the triage vital signs and the nursing notes.  Pertinent labs & imaging results that were available during my care of the patient were reviewed by me and considered in my medical decision making (see chart for details).     (Pt advised to go to ED.  Pt needs labs and monitoring.   Final Clinical Impressions(s) / UC Diagnoses   Final diagnoses:  Caffeine overdose of undetermined intent, initial encounter [facility])   Discharge Instructions   None    ED Prescriptions   None    PDMP not reviewed this encounter.   Fransico Meadow, [place] 12/26/21 1551

## 2021-12-27 NOTE — ED Notes (Signed)
Called 3x for room placement. Eloped from waiting area.  

## 2022-03-16 ENCOUNTER — Telehealth: Payer: Self-pay | Admitting: Interventional Cardiology

## 2022-03-16 NOTE — Telephone Encounter (Signed)
Pt c/o of Chest Pain: STAT if CP now or developed within 24 hours  1. Are you having CP right now? Yes   2. Are you experiencing any other symptoms (ex. SOB, nausea, vomiting, sweating)? no  3. How long have you been experiencing CP? Friday night  4. Is your CP continuous or coming and going? Coming and going (when she moves a lot it get worse, and when she leans over she can really feel it)  5. Have you taken Nitroglycerin? no ?

## 2022-03-16 NOTE — Telephone Encounter (Signed)
Spoke with pt who reports active CP now with intermittent chest pain since Friday evening.  Pt states she is not certain if she might have pulled a muscle.  Pain is in the center of her chest with some radiation to the left at times.  Pt is unable to check vital signs at home.  She denies current shortness of breath, dizziness or N&V.  She does endorse weakness.  She states she is not taking medications. Pt advised with current symptoms she should be further evaluated in the ED.  Encouraged pt to have someone drive her to ED or call EMS.  Pt verbalizes understanding and agrees with current plan.

## 2022-04-07 ENCOUNTER — Ambulatory Visit (INDEPENDENT_AMBULATORY_CARE_PROVIDER_SITE_OTHER): Payer: Medicare Other | Admitting: Podiatry

## 2022-04-07 VITALS — BP 139/77

## 2022-04-07 DIAGNOSIS — Q828 Other specified congenital malformations of skin: Secondary | ICD-10-CM

## 2022-04-07 DIAGNOSIS — M79672 Pain in left foot: Secondary | ICD-10-CM

## 2022-04-07 DIAGNOSIS — M79671 Pain in right foot: Secondary | ICD-10-CM

## 2022-04-07 NOTE — Progress Notes (Signed)
  Subjective:  Patient ID: Jenny Oliver, female    DOB: 01/12/1946,  MRN: [id number]  Jenny Oliver presents to clinic today for painful porokeratotic lesions both feet. Pain prevent(s) comfortable ambulation. Aggravating factor is weightbearing with and without shoegear.  Chief Complaint  Patient presents with   Nail Problem    RFC PCP-[place] PCP VST-Over an year   New problem(s): None.   PCP is Deland Pretty, MD.  Allergies  Allergen Reactions   Alpha Blocker Quinazolines Other (See Comments)   Clindamycin Other (See Comments)   Fluticasone Dermatitis and Other (See Comments)   Guaifenesin Er Other (See Comments)   [place] Bread [Wolfiporia Cocos] Other (See Comments)   Lipitor [Atorvastatin] Nausea And Vomiting   Loratadine Other (See Comments)   Milk-Related Compounds Other (See Comments)   Other     Per pt avoids Bread, peanuts/tree nuts due to bloating  opioids causes nausea and vomiting Lactose intolerance   Peanut-Containing Drug Products Other (See Comments)   Rosuvastatin Nausea Only and Other (See Comments)   Zetia [Ezetimibe]     Review of Systems: Negative except as noted in the HPI.  Objective: No changes noted in today's physical examination. Vitals:   04/07/22 1455  BP: 139/77   Jenny Oliver is a pleasant 77 y.o. female WD, WN in NAD. AAO x 3.  Vascular Examination: Capillary refill time immediate b/l.Vascular status intact b/l with palpable pedal pulses. Pedal hair absent b/l. No edema. No pain with calf compression b/l. Skin temperature gradient WNL b/l. No edema noted b/l LE.  Neurological Examination: Sensation grossly intact b/l with 10 gram monofilament. Vibratory sensation intact b/l.   Dermatological Examination: Pedal skin with normal turgor, texture and tone b/l.  No open wounds b/l LE. No interdigital macerations noted b/l LE. Toenail(s) 1-5 bilaterally mycotic with adequate length.   Porokeratotic lesion(s) medial DIPJ of L 2nd toe, R 5th  toe, submet head 1 left foot, and submet head 5 b/l. No erythema, no edema, no drainage, no fluctuance.  Musculoskeletal Examination: Normal muscle strength 5/5 to all lower extremity muscle groups bilaterally. HAV with bunion bilaterally and hammertoes 2-5 b/l.[place] Kitchen No pain, crepitus or joint limitation noted with ROM b/l LE.  Patient ambulates independently without assistive aids.  Radiographs: None Assessment/Plan: 1. Porokeratosis   2. Pain in both feet     -Consent given for treatment as described below: -Examined patient. -Medicare ABN signed. Patient consents for services of paring of lesions  today. Copy has been placed in patient's chart. -Porokeratotic lesion(s) L 2nd toe, right fifth digit, submet head 1 left foot, and submet head 5 b/l pared and enucleated with sterile currette without incident. Total number of lesions debrided=5. -Patient/POA to call should there be question/concern in the interim.   Return in about 3 months (around 07/07/2022).  Marzetta Board, DPM

## 2022-04-12 ENCOUNTER — Encounter: Payer: Self-pay | Admitting: Podiatry

## 2022-04-19 DIAGNOSIS — Z86010 Personal history of colonic polyps: Secondary | ICD-10-CM | POA: Diagnosis not present

## 2022-04-19 DIAGNOSIS — K449 Diaphragmatic hernia without obstruction or gangrene: Secondary | ICD-10-CM | POA: Diagnosis not present

## 2022-04-19 DIAGNOSIS — K2970 Gastritis, unspecified, without bleeding: Secondary | ICD-10-CM | POA: Diagnosis not present

## 2022-04-19 DIAGNOSIS — K319 Disease of stomach and duodenum, unspecified: Secondary | ICD-10-CM | POA: Diagnosis not present

## 2022-04-19 DIAGNOSIS — Z09 Encounter for follow-up examination after completed treatment for conditions other than malignant neoplasm: Secondary | ICD-10-CM | POA: Diagnosis not present

## 2022-04-19 DIAGNOSIS — D123 Benign neoplasm of transverse colon: Secondary | ICD-10-CM | POA: Diagnosis not present

## 2022-04-19 DIAGNOSIS — K259 Gastric ulcer, unspecified as acute or chronic, without hemorrhage or perforation: Secondary | ICD-10-CM | POA: Diagnosis not present

## 2022-04-19 DIAGNOSIS — R1310 Dysphagia, unspecified: Secondary | ICD-10-CM | POA: Diagnosis not present

## 2022-04-19 DIAGNOSIS — K649 Unspecified hemorrhoids: Secondary | ICD-10-CM | POA: Diagnosis not present

## 2022-04-19 DIAGNOSIS — K5730 Diverticulosis of large intestine without perforation or abscess without bleeding: Secondary | ICD-10-CM | POA: Diagnosis not present

## 2022-04-20 DIAGNOSIS — L0390 Cellulitis, unspecified: Secondary | ICD-10-CM | POA: Diagnosis not present

## 2022-04-21 DIAGNOSIS — K319 Disease of stomach and duodenum, unspecified: Secondary | ICD-10-CM | POA: Diagnosis not present

## 2022-04-21 DIAGNOSIS — D123 Benign neoplasm of transverse colon: Secondary | ICD-10-CM | POA: Diagnosis not present

## 2022-04-22 DIAGNOSIS — L0390 Cellulitis, unspecified: Secondary | ICD-10-CM | POA: Diagnosis not present

## 2022-04-26 ENCOUNTER — Other Ambulatory Visit: Payer: Self-pay | Admitting: *Deleted

## 2022-04-26 DIAGNOSIS — I878 Other specified disorders of veins: Secondary | ICD-10-CM

## 2022-04-29 ENCOUNTER — Ambulatory Visit (HOSPITAL_COMMUNITY)
Admission: RE | Admit: 2022-04-29 | Discharge: 2022-04-29 | Disposition: A | Discharge status: Home / Self Care | Payer: Medicare Other | Source: Ambulatory Visit | Attending: Vascular Surgery | Admitting: Vascular Surgery

## 2022-04-29 DIAGNOSIS — I878 Other specified disorders of veins: Secondary | ICD-10-CM

## 2022-05-04 ENCOUNTER — Encounter: Payer: Medicare Other | Admitting: Vascular Surgery

## 2022-05-07 ENCOUNTER — Ambulatory Visit (INDEPENDENT_AMBULATORY_CARE_PROVIDER_SITE_OTHER): Payer: Medicare Other | Admitting: Vascular Surgery

## 2022-05-07 VITALS — BP 119/78 | HR 79 | Temp 98.2°F | Resp 14 | Ht 63.0 in | Wt 113.0 lb

## 2022-05-07 DIAGNOSIS — S81801A Unspecified open wound, right lower leg, initial encounter: Secondary | ICD-10-CM | POA: Diagnosis not present

## 2022-05-07 NOTE — Progress Notes (Signed)
Office Note     CC: Right leg skin changes Requesting Provider:  Deon Pilling, NP  HPI: Jenny Oliver is a 77 y.o. ([birth year]) female presenting at the request of .Deland Pretty, MD to evaluate for possible chronic venous insufficiency due to right leg skin changes.  Exam today, Jenny Oliver was doing well.  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  Her mother was a Social worker at [employer].  On exam, Jenny Oliver was doing well, complaining of right calf itching.  She has had waxing waning swelling in the past, but has recently had an open wound on the anterior surface of the calf.  She has been treating this with rubbing alcohol.  She notes weeping at the dermis, and dry skin.  Jenny Oliver denies symptoms of claudication, ischemic rest pain, tissue loss in the foot.   The pt is not on a statin for cholesterol management.  The pt is  on a daily aspirin.   Other AC:  -   Past Medical History:  Diagnosis Date   Abnormality of gait    due to balance instability,  episodic   Benign paroxysmal positional vertigo    Bilateral lower extremity edema    Chronic insomnia 04/24/2015   COPD with emphysema ([place])    last chest CT 01-12-2018   DDD (degenerative disc disease), lumbosacral    Full dentures    GERD (gastroesophageal reflux disease)    no meds   Headache    Hiatal hernia    History of esophageal dilatation    2013 balloon dilatation   History of Helicobacter pylori infection    Hypertensive chronic kidney disease with stage 1 through stage 4 chronic kidney disease, or unspecified chronic kidney disease    per pt pcp note in epic   IBS (irritable bowel syndrome)    IBS (irritable bowel syndrome)    Liddle's syndrome    hypertension treated with potassium sparing diuretic, amiloride   Meningioma (HCC)    left cavernous sinus, MRI 09-16-2015 in epic;  followed by neurologist, dr Jannifer Franklin   OA (osteoarthritis)    Right ureteral stone    Sciatica    Smokers' cough ([place])     occasionally productive   Vitamin B12 deficiency    Vitamin D deficiency     Past Surgical History:  Procedure Laterality Date   BALLOON DILATION  11/03/2011   Procedure: BALLOON DILATION;  Surgeon: Arta Silence, MD;  Location: [facility];  Service: Endoscopy;  Laterality: N/A;   CYSTOSCOPY WITH URETEROSCOPY AND STENT PLACEMENT Right 10/09/2018   Procedure: CYSTOSCOPY WITH RIGHT  URETEROSCOPY HOLMIUN LASER , RETROGRADE AND STENT PLACEMENT;  Surgeon: Lucas Mallow, MD;  Location: [facility];  Service: Urology;  Laterality: Right;   TONSILLECTOMY  child   VAGINAL HYSTERECTOMY  1994    Social History   Socioeconomic History   Marital status: Divorced    Spouse name: Not on file   Number of children: 1   Years of education: graduate   Highest education level: Not on file  Occupational History   Occupation: retired  Tobacco Use   Smoking status: Every Day    Packs/day: 1.50    Years: 48.00    Total pack years: 72.00    Types: Cigarettes   Smokeless tobacco: Never   Tobacco comments:    06/05/21 down to 0.5 pack    10-04-2018  per pt down to 2-3 cig per day from 1-2 ppd  Vaping Use   Vaping Use: Never used  Substance and Sexual Activity   Alcohol use: No   Drug use: Never   Sexual activity: Not on file  Other Topics Concern   Not on file  Social History Narrative   Patient drinks about 2 cups of caffeine daily.   Patient is right handed.   Social Determinants of Health   Financial Resource Strain: Not on file  Food Insecurity: Not on file  Transportation Needs: Not on file  Physical Activity: Not on file  Stress: Not on file  Social Connections: Not on file  Intimate Partner Violence: Not on file   Family History  Problem Relation Age of Onset   Heart disease Mother    Hyperlipidemia Mother    Cancer Father        stomach   Healthy Brother     Current Outpatient Medications  Medication Sig Dispense Refill   acetaminophen (TYLENOL)  650 MG CR tablet Take 1,300 mg by mouth daily.      albuterol (VENTOLIN HFA) 108 (90 Base) MCG/ACT inhaler 2 puffs     aMILoride (MIDAMOR) 5 MG tablet Take 5 mg by mouth 2 (two) times daily.      aspirin EC 81 MG tablet Take 1 tablet (81 mg total) by mouth daily. Swallow whole. 90 tablet 3   Coenzyme Q10 (CO Q 10) 10 MG CAPS daily.     GLUCOSAMINE-CHONDROITIN DS PO Take 1 tablet by mouth in the morning and at bedtime.     PRALUENT 150 MG/ML SOAJ INJECT 150 MG UNDER THE SKIN EVERY 14 DAYS 2 mL 11   Tiotropium Bromide Monohydrate (SPIRIVA RESPIMAT) 2.5 MCG/ACT AERS Inhale 2 puffs into the lungs daily. 4 g 0   No current facility-administered medications for this visit.    Allergies  Allergen Reactions   Alpha Blocker Quinazolines Other (See Comments)   Clindamycin Other (See Comments)   Fluticasone Dermatitis and Other (See Comments)   Guaifenesin Er Other (See Comments)   [place] Bread [Wolfiporia Cocos] Other (See Comments)   Lipitor [Atorvastatin] Nausea And Vomiting   Loratadine Other (See Comments)   Milk-Related Compounds Other (See Comments)   Other     Per pt avoids Bread, peanuts/tree nuts due to bloating  opioids causes nausea and vomiting Lactose intolerance   Peanut-Containing Drug Products Other (See Comments)   Rosuvastatin Nausea Only and Other (See Comments)   Zetia [Ezetimibe]      REVIEW OF SYSTEMS:  [X]$  denotes positive finding, [ ]$  denotes negative finding Cardiac  Comments:  Chest pain or chest pressure:    Shortness of breath upon exertion:    Short of breath when lying flat:    Irregular heart rhythm:        Vascular    Pain in calf, thigh, or hip brought on by ambulation:    Pain in feet at night that wakes you up from your sleep:     Blood clot in your veins:    Leg swelling:         Pulmonary    Oxygen at home:    Productive cough:     Wheezing:         Neurologic    Sudden weakness in arms or legs:     Sudden numbness in arms or legs:      Sudden onset of difficulty speaking or slurred speech:    Temporary loss of vision in one eye:     Problems with  dizziness:         Gastrointestinal    Blood in stool:     Vomited blood:         Genitourinary    Burning when urinating:     Blood in urine:        Psychiatric    Major depression:         Hematologic    Bleeding problems:    Problems with blood clotting too easily:        Skin    Rashes or ulcers:        Constitutional    Fever or chills:      PHYSICAL EXAMINATION:  There were no vitals filed for this visit.  General:  WDWN in NAD; vital signs documented above Gait: Not observed HENT: WNL, normocephalic Pulmonary: normal non-labored breathing , without wheezing Cardiac: regular HR Abdomen: soft, NT, no masses Skin: without rashes Vascular Exam/Pulses:  Right Left  Radial 2+ (normal) 2+ (normal)  Ulnar    Femoral    Popliteal    DP 2+ (normal) 2+ (normal)  PT     Extremities: without ischemic changes, without Gangrene , without cellulitis; with open wounds;   Musculoskeletal: no muscle wasting or atrophy  Neurologic: A&O X 3;  No focal weakness or paresthesias are detected Psychiatric:  The pt has Normal affect.   Non-Invasive Vascular Imaging:   Focal venous insufficiency of the common femoral vein.  Otherwise, no superficial venous reflux, no deep venous reflux.    ASSESSMENT/PLAN: Jenny Oliver is a 77 y.o. female presenting with right leg skin changes to the calf.  She has been treating the wound with rubbing alcohol.  I think this is caused a severe degradation of the skin, leading to dryness and skin cracking.  There does not appear to be an underlying infection at this time, the skin appears very irritated.  No significant venous insufficiency appreciated on reflux exam.  The patient has a palpable pulse in the foot, with no concern for arterial disease.  No signs of lymphedema.  Claudell would benefit from wound care referral. I  wrapped the right leg with Xeroform dressing in an effort to provide some moisture to the area.  It was held in place with gauze and an Ace wrap.  I asked that she refrain from using rubbing alcohol, and try petroleum jelly or another barrier.   Jerisha can follow-up with me as needed.  Broadus John, MD [facility] [phone]

## 2022-05-13 ENCOUNTER — Ambulatory Visit
Admission: RE | Admit: 2022-05-13 | Discharge: 2022-05-13 | Disposition: A | Discharge status: Home / Self Care | Payer: Medicare Other | Source: Ambulatory Visit | Attending: Acute Care | Admitting: Acute Care

## 2022-05-13 ENCOUNTER — Other Ambulatory Visit: Payer: Medicare Other

## 2022-05-13 DIAGNOSIS — S2221XA Fracture of manubrium, initial encounter for closed fracture: Secondary | ICD-10-CM | POA: Diagnosis not present

## 2022-05-13 DIAGNOSIS — Z87891 Personal history of nicotine dependence: Secondary | ICD-10-CM | POA: Diagnosis not present

## 2022-05-13 DIAGNOSIS — J439 Emphysema, unspecified: Secondary | ICD-10-CM | POA: Diagnosis not present

## 2022-05-13 DIAGNOSIS — F17210 Nicotine dependence, cigarettes, uncomplicated: Secondary | ICD-10-CM

## 2022-05-13 DIAGNOSIS — K449 Diaphragmatic hernia without obstruction or gangrene: Secondary | ICD-10-CM | POA: Diagnosis not present

## 2022-05-14 ENCOUNTER — Telehealth: Payer: Self-pay | Admitting: Acute Care

## 2022-05-14 NOTE — Telephone Encounter (Signed)
Received call report from [facility] with [place] radiology on CT low dose.  Sarah, please advise. Thanks

## 2022-05-26 ENCOUNTER — Telehealth: Payer: Self-pay

## 2022-05-26 NOTE — Telephone Encounter (Signed)
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 I have left a VM at wound care office to get clarification on appt times and see if there is anything they can do to work with her schedule and if not, where else we can look into. Pt is aware of the above.

## 2022-05-27 ENCOUNTER — Other Ambulatory Visit: Payer: Self-pay

## 2022-05-27 ENCOUNTER — Telehealth: Payer: Self-pay | Admitting: Acute Care

## 2022-05-27 DIAGNOSIS — Z87891 Personal history of nicotine dependence: Secondary | ICD-10-CM

## 2022-05-27 DIAGNOSIS — R911 Solitary pulmonary nodule: Secondary | ICD-10-CM

## 2022-05-27 NOTE — Telephone Encounter (Signed)
Ordered place for 6 months LDCT nodule follow up

## 2022-05-27 NOTE — Telephone Encounter (Signed)
I have called the patient with the results of her low-dose screening CT.  I explained that her scan was read as a lung RADS 3, 50-monthfollow-up.  Her scan was revealing for new scattered tiny bilateral pulmonary nodules measuring up to 5.7 mm in the periphery of her posterior right upper lobe. Additionally there was notation of a fracture of the manubrium which is new in the interval but appears nonacute.  There is some underlying lucency of the bone at the level of the fracture site raising a question of pathologic fracture although it may be related to chronic fracture nonunion. Additionally there was notation of a 15 mm right thyroid nodule with the recommendation of an ultrasound of the thyroid to further evaluate. Patient verbalized understanding of the above. I told her we will contact her primary care doctor, WDeland Pretty MD, so that he can follow-up with the findings regarding the thyroid and the fracture of the manubrium. Patient verbalized understanding of the above, she had no further questions at completion of the call.  I told her I would reach out to Dr. PPennie Banteroffice with the results and if she has not heard from his office in the next week she should give them a call to ensure she has follow-up regarding thyroid gland and fracture of the manubrium. Denise patient needs a 652-monthollow-up low-dose CT screening scan.  I have added Dr. PhShelia Mediao this message. I have also called his office and spoke with [place] Base HousingShe will also relay the message.  Thanks so much

## 2022-05-28 ENCOUNTER — Telehealth: Payer: Self-pay

## 2022-05-28 NOTE — Telephone Encounter (Signed)
Spoke with Destiny at [facility] regarding pt unable to get a later afternoon new pt appt. Destiny was able to speak with nurse manager to get pt an appt 3/7 at 2pm. I have let pt know this and she verbalized understanding of this appt.

## 2022-06-02 NOTE — Telephone Encounter (Signed)
See encounter from 02/29. Will close encounter.

## 2022-06-03 ENCOUNTER — Ambulatory Visit (HOSPITAL_BASED_OUTPATIENT_CLINIC_OR_DEPARTMENT_OTHER): Payer: Medicare Other | Admitting: Internal Medicine

## 2022-06-21 DIAGNOSIS — D72819 Decreased white blood cell count, unspecified: Secondary | ICD-10-CM | POA: Diagnosis not present

## 2022-06-21 DIAGNOSIS — R1012 Left upper quadrant pain: Secondary | ICD-10-CM | POA: Diagnosis not present

## 2022-06-21 DIAGNOSIS — E041 Nontoxic single thyroid nodule: Secondary | ICD-10-CM | POA: Diagnosis not present

## 2022-06-21 DIAGNOSIS — R918 Other nonspecific abnormal finding of lung field: Secondary | ICD-10-CM | POA: Diagnosis not present

## 2022-06-21 DIAGNOSIS — R634 Abnormal weight loss: Secondary | ICD-10-CM | POA: Diagnosis not present

## 2022-06-25 ENCOUNTER — Other Ambulatory Visit: Payer: Self-pay | Admitting: Pulmonary Disease

## 2022-06-29 ENCOUNTER — Telehealth: Payer: Self-pay | Admitting: Pulmonary Disease

## 2022-06-29 MED ORDER — SPIRIVA RESPIMAT 2.5 MCG/ACT IN AERS: 2.0000 | INHALATION_SPRAY | Freq: Every day | RESPIRATORY_TRACT | 0 refills | Status: DC

## 2022-06-29 NOTE — Telephone Encounter (Signed)
Called and spoke with pt. Patient verified pharmacy. Refill has been sent to the patients pharmacy.   Nothing further needed.

## 2022-07-07 DIAGNOSIS — E785 Hyperlipidemia, unspecified: Secondary | ICD-10-CM | POA: Diagnosis not present

## 2022-07-07 DIAGNOSIS — I152 Hypertension secondary to endocrine disorders: Secondary | ICD-10-CM | POA: Diagnosis not present

## 2022-07-07 DIAGNOSIS — R7303 Prediabetes: Secondary | ICD-10-CM | POA: Diagnosis not present

## 2022-07-14 ENCOUNTER — Other Ambulatory Visit (HOSPITAL_COMMUNITY): Payer: Self-pay

## 2022-07-14 ENCOUNTER — Telehealth: Payer: Self-pay | Admitting: Cardiology

## 2022-07-14 ENCOUNTER — Telehealth: Payer: Self-pay

## 2022-07-14 DIAGNOSIS — I2510 Atherosclerotic heart disease of native coronary artery without angina pectoris: Secondary | ICD-10-CM

## 2022-07-14 DIAGNOSIS — E7800 Pure hypercholesterolemia, unspecified: Secondary | ICD-10-CM

## 2022-07-14 DIAGNOSIS — E785 Hyperlipidemia, unspecified: Secondary | ICD-10-CM

## 2022-07-14 MED ORDER — REPATHA SURECLICK 140 MG/ML ~~LOC~~ SOAJ: 1.0000 mL | SUBCUTANEOUS | 1 refills | Status: DC

## 2022-07-14 NOTE — Telephone Encounter (Signed)
Pharmacy Patient Advocate Encounter   Received notification from [facility] MEDICARE that prior authorization for REPATHA is needed.    PA submitted on 07/11/22 Key [id number] Status is pending  Haze Rushing, CPhT Pharmacy Patient Advocate Specialist Direct Number: [phone] Fax: [phone]

## 2022-07-14 NOTE — Telephone Encounter (Signed)
Per test claim Praluent is non form/plan/benefit exclusion. Repatha is preferred. Please advise if change is appropriate.

## 2022-07-14 NOTE — Addendum Note (Signed)
Addended by: Cheree Ditto on: 07/14/2022 03:29 PM   Modules accepted: Orders

## 2022-07-14 NOTE — Telephone Encounter (Signed)
Pt c/o medication issue:  1. Name of Medication: PRALUENT 150 MG/ML SOAJ   2. How are you currently taking this medication (dosage and times per day)?   INJECT 150 MG UNDER THE SKIN EVERY 14 DAYS    3. Are you having a reaction (difficulty breathing--STAT)? No  4. What is your medication issue? Arlys John from [facility] called to see if office has received PA request for this above medication that was send on 07/12/2022 and would like a callback about it. He stated the script number is [phone] and filled at store [id number]

## 2022-07-14 NOTE — Telephone Encounter (Signed)
Pharmacy Patient Advocate Encounter  Prior Authorization for REPATHA has been approved.    Effective dates: 03/29/22 through 07/14/23  Haze Rushing, CPhT Pharmacy Patient Advocate Specialist Direct Number: [phone] Fax: [phone]

## 2022-07-14 NOTE — Telephone Encounter (Signed)
Called patient and let her know of switch to Repatha. Patient voiced understanding.

## 2022-07-15 ENCOUNTER — Telehealth: Payer: Self-pay | Admitting: Cardiology

## 2022-07-15 DIAGNOSIS — R1010 Upper abdominal pain, unspecified: Secondary | ICD-10-CM | POA: Diagnosis not present

## 2022-07-15 DIAGNOSIS — K259 Gastric ulcer, unspecified as acute or chronic, without hemorrhage or perforation: Secondary | ICD-10-CM | POA: Diagnosis not present

## 2022-07-15 DIAGNOSIS — K319 Disease of stomach and duodenum, unspecified: Secondary | ICD-10-CM | POA: Diagnosis not present

## 2022-07-15 DIAGNOSIS — K449 Diaphragmatic hernia without obstruction or gangrene: Secondary | ICD-10-CM | POA: Diagnosis not present

## 2022-07-15 NOTE — Telephone Encounter (Signed)
Pt on Repatha now, rx sent to [facility] pharmacy. Called [facility] to tell them to cancel out Praluent rx but hold time too long, faxed them this message instead.

## 2022-07-15 NOTE — Telephone Encounter (Signed)
Pt c/o medication issue:  1. Name of Medication: Praluent  2. How are you currently taking this medication (dosage and times per day)?   3. Are you having a reaction (difficulty breathing--STAT)?   4. What is your medication issue? Pharmacy is calling to get clarification on medication and to see if prior auth was received. Please advise.

## 2022-07-16 ENCOUNTER — Ambulatory Visit (INDEPENDENT_AMBULATORY_CARE_PROVIDER_SITE_OTHER): Payer: Medicare Other | Admitting: Pulmonary Disease

## 2022-07-16 ENCOUNTER — Encounter (HOSPITAL_BASED_OUTPATIENT_CLINIC_OR_DEPARTMENT_OTHER): Payer: Self-pay | Admitting: Pulmonary Disease

## 2022-07-16 VITALS — BP 130/76 | HR 81 | Ht 63.0 in | Wt 108.6 lb

## 2022-07-16 DIAGNOSIS — Z720 Tobacco use: Secondary | ICD-10-CM

## 2022-07-16 DIAGNOSIS — J439 Emphysema, unspecified: Secondary | ICD-10-CM | POA: Diagnosis not present

## 2022-07-16 MED ORDER — SPIRIVA RESPIMAT 2.5 MCG/ACT IN AERS: 2.0000 | INHALATION_SPRAY | Freq: Every day | RESPIRATORY_TRACT | 6 refills | Status: DC

## 2022-07-16 NOTE — Progress Notes (Signed)
Subjective:   PATIENT ID: Jenny Oliver GENDER: female DOB: 03-Jul-1945, MRN: [id number]   HPI  Chief Complaint  Patient presents with   Follow-up    Breathing mostly good gets sob sometime    Reason for Visit: Follow-up emphysema  [name] is a 77 year old female active smoker with COPD, allergic rhinitis, GERD, hyperlipidemia, osteoarthritis Jenny Oliver's syndrome who presents for follow-up emphysema.  Initial consult 06/06/22 She has been referred by her PCP Jenny Oliver for pulmonary evaluation.  She was seen on 05/18/2021.  Advised to quit smoking and prescribed Proventil HFA.  She is using her rescue inhaler 1-2 times a day with improvement in her shortness of breath associated with chest tightness. Also improves with rest. Occasional non-productive cough. Triggered by cold air. Her symptoms have worsening in the last six months with more frequent shortness of breath. Denies wheezing and no nocturnal symptoms.   07/16/22 Since our last visit she was started on Spiriva and reports her shortness of breath, cough and chest tightness had improved by 95%. Does have occasional coughing spells. She was contacted regarding her pulmonary nodules on 05/11/22. She is still lighting up cigarettes 2-5 a day. Has not tried her Chantix. No exacerbations since our last visit  Social History: Active smoker. Smokes 1/4 ppd. 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   Past Medical History:  Diagnosis Date   Abnormality of gait    due to balance instability,  episodic   Benign paroxysmal positional vertigo    Bilateral lower extremity edema    Chronic insomnia 04/24/2015   COPD with emphysema    last chest CT 01-12-2018   DDD (degenerative disc disease), lumbosacral    Full dentures    GERD (gastroesophageal reflux disease)    no meds   Headache    Hiatal hernia    History of esophageal dilatation    2013 balloon dilatation   History of Helicobacter pylori infection     Hypertensive chronic kidney disease with stage 1 through stage 4 chronic kidney disease, or unspecified chronic kidney disease    per pt pcp note in epic   IBS (irritable bowel syndrome)    IBS (irritable bowel syndrome)    Jenny Oliver's syndrome    hypertension treated with potassium sparing diuretic, amiloride   Meningioma    left cavernous sinus, MRI 09-16-2015 in epic;  followed by neurologist, dr Jenny Oliver   OA (osteoarthritis)    Right ureteral stone    Sciatica    Smokers' cough    occasionally productive   Vitamin B12 deficiency    Vitamin D deficiency      Family History  Problem Relation Age of Onset   Heart disease Mother    Hyperlipidemia Mother    Cancer Father        stomach   Healthy Brother      Social History   Occupational History   Occupation: retired  Tobacco Use   Smoking status: Every Day    Packs/day: 1.50    Years: 48.00    Additional pack years: 0.00    Total pack years: 72.00    Types: Cigarettes   Smokeless tobacco: Never   Tobacco comments:    Started smoking about age 80    06/05/21 down to 0.5 pack    10-04-2018  per pt down to 2-3 cig per day from 1-2 ppd  Vaping Use   Vaping Use: Never used  Substance and Sexual Activity  Alcohol use: No   Drug use: Never   Sexual activity: Not on file    Allergies  Allergen Reactions   Alpha Blocker Quinazolines Other (See Comments)   Clindamycin Other (See Comments)   Fluticasone Dermatitis and Other (See Comments)   Guaifenesin Er Other (See Comments)   [place] Bread [Wolfiporia Cocos] Other (See Comments)   Lipitor [Atorvastatin] Nausea And Vomiting   Loratadine Other (See Comments)   Milk-Related Compounds Other (See Comments)   Other     Per pt avoids Bread, peanuts/tree nuts due to bloating  opioids causes nausea and vomiting Lactose intolerance   Peanut-Containing Drug Products Other (See Comments)   Rosuvastatin Nausea Only and Other (See Comments)   Zetia [Ezetimibe]       Outpatient Medications Prior to Visit  Medication Sig Dispense Refill   acetaminophen (TYLENOL) 650 MG CR tablet Take 1,300 mg by mouth daily.      albuterol (VENTOLIN HFA) 108 (90 Base) MCG/ACT inhaler 2 puffs     aMILoride (MIDAMOR) 5 MG tablet Take 5 mg by mouth 2 (two) times daily.      aspirin EC 81 MG tablet Take 1 tablet (81 mg total) by mouth daily. Swallow whole. 90 tablet 3   Coenzyme Q10 (CO Q 10) 10 MG CAPS daily.     Evolocumab (REPATHA SURECLICK) 140 MG/ML SOAJ Inject 140 mg into the skin every 14 (fourteen) days. 6 mL 1   GLUCOSAMINE-CHONDROITIN DS PO Take 1 tablet by mouth in the morning and at bedtime.     Tiotropium Bromide Monohydrate (SPIRIVA RESPIMAT) 2.5 MCG/ACT AERS Inhale 2 puffs into the lungs daily. 4 g 0   No facility-administered medications prior to visit.    Review of Systems  Constitutional:  Negative for chills, diaphoresis, fever, malaise/fatigue and weight loss.  HENT:  Negative for congestion.   Respiratory:  Positive for cough. Negative for hemoptysis, sputum production, shortness of breath and wheezing.   Cardiovascular:  Negative for chest pain, palpitations and leg swelling.     Objective:   Vitals:   07/16/22 1458  BP: 130/76  Pulse: 81  SpO2: 97%  Weight: 108 lb 9.6 oz (49.3 kg)  Height:  (1.6 m)   SpO2: 97 % O2 Device: None (Room air)  Physical Exam: General: Well-appearing, no acute distress HENT: [place], AT Eyes: EOMI, no scleral icterus Respiratory: Clear to auscultation bilaterally.  No crackles, wheezing or rales Cardiovascular: RRR, -M/R/G, no JVD Extremities:-Edema,-tenderness Neuro: AAO x4, CNII-XII grossly intact Psych: Normal mood, normal affect   Data Reviewed:  Imaging: CT lung screening 05/11/2021-moderate centrilobular emphysema with diffuse bronchial wall thickening.  No growth or interval of previously seen pulmonary nodules CT lung screen 05/13/22 - moderate centrilobular emphysema. Scattered nodules with  new tiny bilaterally pulmonary nodules with larges 5-6 mm in RUL  PFT: 05/13/2016 FVC 2.11 (93%) FEV1 1.48 (84%) Ratio 68  Interpretation: Mild obstructive defect is present.  No significant bronchodilator response however does not preclude the use of bronchodilators.  F-V loops suggestive of obstructive defect  Labs: OSH absolute eosinophils 09/17/2020-100     Assessment & Plan:   Discussion: 77 year old female active smoker with COPD, allergic rhinitis, GERD, HLD, osteoarthritis, Liddles' syndrome who presents for follow-up. Improved respiratory symptoms on LAMA alone. Discussed clinical course and management of COPD including bronchodilator regimen, preventive care including vaccinations and action plan for exacerbation. Reviewed CT lung screen and discussed differential including malignancy, infection, inflammation. Low suspicion for malignancy at this time  but recommend continued surveillance as scheduled with repeat CT lung in August 2024. Counseled on smoking cessation  Emphysema - improved --CONTINUE Spiriva 2.5 mcg TWO puffs ONCE a day --CONTINUE Proventil TWO puffs AS NEEDED for shortness of breath or wheezing  Tobacco abuse Patient is an active smoker. Failed wellbutrin. Previously had success with chantix. Not using it We discussed smoking cessation for 8 minutes. We discussed triggers and stressors and ways to deal with them. We discussed barriers to continued smoking and benefits of smoking cessation. Provided patient with information cessation techniques and interventions including [place] quitline.   Health Maintenance Immunization History  Administered Date(s) Administered   Pneumococcal Conjugate-13 11/26/2016   Pneumococcal Polysaccharide-23 11/28/2007   CT Lung Screen - enrolled  No orders of the defined types were placed in this encounter.  Meds ordered this encounter  Medications   Tiotropium Bromide Monohydrate (SPIRIVA RESPIMAT) 2.5 MCG/ACT AERS    Sig: Inhale 2  puffs into the lungs daily.    Dispense:  4 g    Refill:  6    Return in about 5 months (around 12/16/2022).  I have spent a total time of 35-minutes on the day of the appointment including chart review, data review, collecting history, coordinating care and discussing medical diagnosis and plan with the patient/family. Past medical history, allergies, medications were reviewed. Pertinent imaging, labs and tests included in this note have been reviewed and interpreted independently by me.   Jenny Ozbun Mechele Collin, MD [facility] 07/16/2022 4:49 PM  Office Number [phone]

## 2022-07-16 NOTE — Patient Instructions (Signed)
Emphysema - improved --CONTINUE Spiriva 2.5 mcg TWO puffs ONCE a day --CONTINUE Proventil TWO puffs AS NEEDED for shortness of breath or wheezing  Tobacco abuse Patient is an active smoker. Failed wellbutrin. Previously had success with chantix. Not using it We discussed smoking cessation for <3 minutes. We discussed triggers and stressors and ways to deal with them. We discussed barriers to continued smoking and benefits of smoking cessation. Provided patient with information cessation techniques and interventions including [place] quitline.

## 2022-07-19 DIAGNOSIS — K319 Disease of stomach and duodenum, unspecified: Secondary | ICD-10-CM | POA: Diagnosis not present

## 2022-07-20 ENCOUNTER — Encounter: Payer: Self-pay | Admitting: Podiatry

## 2022-07-20 ENCOUNTER — Ambulatory Visit (INDEPENDENT_AMBULATORY_CARE_PROVIDER_SITE_OTHER): Payer: Medicare Other | Admitting: Podiatry

## 2022-07-20 VITALS — BP 133/75

## 2022-07-20 DIAGNOSIS — M79671 Pain in right foot: Secondary | ICD-10-CM | POA: Diagnosis not present

## 2022-07-20 DIAGNOSIS — Q828 Other specified congenital malformations of skin: Secondary | ICD-10-CM

## 2022-07-20 DIAGNOSIS — M79672 Pain in left foot: Secondary | ICD-10-CM | POA: Diagnosis not present

## 2022-07-21 DIAGNOSIS — E041 Nontoxic single thyroid nodule: Secondary | ICD-10-CM | POA: Diagnosis not present

## 2022-07-22 ENCOUNTER — Other Ambulatory Visit: Payer: Self-pay | Admitting: Endocrinology

## 2022-07-22 DIAGNOSIS — E041 Nontoxic single thyroid nodule: Secondary | ICD-10-CM

## 2022-07-24 NOTE — Progress Notes (Signed)
  Subjective:  Patient ID: Jenny Oliver, female    DOB: 1946-02-01,  MRN: [id number]  Jenny Oliver presents to clinic today for painful porokeratotic lesions both feet. Pain prevent(s) comfortable ambulation. Aggravating factor is weightbearing with and without shoegear.  Chief Complaint  Patient presents with   Callouses    Rm 16  Callus debridement only per pt.    New problem(s): None.   PCP is Merri Brunette, MD.  Allergies  Allergen Reactions   Alpha Blocker Quinazolines Other (See Comments)   Clindamycin Other (See Comments)   Fluticasone Dermatitis and Other (See Comments)   Guaifenesin Er Other (See Comments)   [place] Bread [Wolfiporia Cocos] Other (See Comments)   Lipitor [Atorvastatin] Nausea And Vomiting   Loratadine Other (See Comments)   Milk-Related Compounds Other (See Comments)   Other     Per pt avoids Bread, peanuts/tree nuts due to bloating  opioids causes nausea and vomiting Lactose intolerance   Peanut-Containing Drug Products Other (See Comments)   Rosuvastatin Nausea Only and Other (See Comments)   Zetia [Ezetimibe]     Review of Systems: Negative except as noted in the HPI.  Objective: No changes noted in today's physical examination. Vitals:   07/20/22 1446  BP: 133/75   Jenny Oliver is a pleasant 77 y.o. female WD, WN in NAD. AAO x 3.  Vascular Examination: Capillary refill time immediate b/l.Vascular status intact b/l with palpable pedal pulses. Pedal hair absent b/l. No edema. No pain with calf compression b/l. Skin temperature gradient WNL b/l. No edema noted b/l LE.  Neurological Examination: Sensation grossly intact b/l with 10 gram monofilament. Vibratory sensation intact b/l.   Dermatological Examination: Pedal skin with normal turgor, texture and tone b/l.  No open wounds b/l LE. No interdigital macerations noted b/l LE. Toenail(s) 1-5 bilaterally mycotic with adequate length.   Porokeratotic lesion(s) medial DIPJ of L 2nd toe, R  5th toe, submet head 1 left foot, and submet head 5 b/l. No erythema, no edema, no drainage, no fluctuance.  Musculoskeletal Examination: Normal muscle strength 5/5 to all lower extremity muscle groups bilaterally. HAV with bunion bilaterally and hammertoes 2-5 b/l.Marland Kitchen No pain, crepitus or joint limitation noted with ROM b/l LE.  Patient ambulates independently without assistive aids.  Radiographs: None  Assessment/Plan: 1. Porokeratosis   2. Pain in both feet    -Patient was evaluated and treated. All patient's and/or POA's questions/concerns answered on today's visit. -Medicare ABN on file for paring of corn(s)/callus(es)/porokeratos(es). Copy in patient chart. -Porokeratotic lesion(s) left second digit, R 5th toe, submet head 1 left foot, and submet head 5 b/l pared and enucleated with sterile currette without incident. Total number of lesions debrided=5. -Patient/POA to call should there be question/concern in the interim.   Return in about 3 months (around 10/19/2022).  Freddie Breech, DPM

## 2022-07-29 ENCOUNTER — Inpatient Hospital Stay (HOSPITAL_COMMUNITY): Payer: Medicare Other | Admitting: Certified Registered Nurse Anesthetist

## 2022-07-29 ENCOUNTER — Other Ambulatory Visit: Payer: Self-pay

## 2022-07-29 ENCOUNTER — Encounter (HOSPITAL_COMMUNITY): Payer: Self-pay

## 2022-07-29 ENCOUNTER — Inpatient Hospital Stay (HOSPITAL_COMMUNITY)
Admission: EM | Admit: 2022-07-29 | Discharge: 2022-08-02 | DRG: 355 | Disposition: A | Discharge status: Home / Self Care | Payer: Medicare Other | Attending: General Surgery | Admitting: General Surgery

## 2022-07-29 ENCOUNTER — Encounter (HOSPITAL_COMMUNITY): Admission: EM | Disposition: A | Discharge status: Home / Self Care | Payer: Self-pay | Source: Home / Self Care

## 2022-07-29 ENCOUNTER — Emergency Department (HOSPITAL_COMMUNITY): Payer: Medicare Other

## 2022-07-29 DIAGNOSIS — K219 Gastro-esophageal reflux disease without esophagitis: Secondary | ICD-10-CM | POA: Diagnosis not present

## 2022-07-29 DIAGNOSIS — I2510 Atherosclerotic heart disease of native coronary artery without angina pectoris: Secondary | ICD-10-CM

## 2022-07-29 DIAGNOSIS — Z888 Allergy status to other drugs, medicaments and biological substances status: Secondary | ICD-10-CM

## 2022-07-29 DIAGNOSIS — Z86011 Personal history of benign neoplasm of the brain: Secondary | ICD-10-CM | POA: Diagnosis not present

## 2022-07-29 DIAGNOSIS — F5104 Psychophysiologic insomnia: Secondary | ICD-10-CM | POA: Diagnosis present

## 2022-07-29 DIAGNOSIS — K589 Irritable bowel syndrome without diarrhea: Secondary | ICD-10-CM | POA: Diagnosis present

## 2022-07-29 DIAGNOSIS — Z91010 Allergy to peanuts: Secondary | ICD-10-CM | POA: Diagnosis not present

## 2022-07-29 DIAGNOSIS — K436 Other and unspecified ventral hernia with obstruction, without gangrene: Secondary | ICD-10-CM | POA: Diagnosis present

## 2022-07-29 DIAGNOSIS — Z87442 Personal history of urinary calculi: Secondary | ICD-10-CM | POA: Diagnosis not present

## 2022-07-29 DIAGNOSIS — Z91011 Allergy to milk products: Secondary | ICD-10-CM

## 2022-07-29 DIAGNOSIS — E860 Dehydration: Secondary | ICD-10-CM | POA: Diagnosis present

## 2022-07-29 DIAGNOSIS — F17200 Nicotine dependence, unspecified, uncomplicated: Secondary | ICD-10-CM | POA: Diagnosis not present

## 2022-07-29 DIAGNOSIS — F17210 Nicotine dependence, cigarettes, uncomplicated: Secondary | ICD-10-CM | POA: Diagnosis present

## 2022-07-29 DIAGNOSIS — Z7951 Long term (current) use of inhaled steroids: Secondary | ICD-10-CM

## 2022-07-29 DIAGNOSIS — R64 Cachexia: Secondary | ICD-10-CM | POA: Diagnosis not present

## 2022-07-29 DIAGNOSIS — Z7982 Long term (current) use of aspirin: Secondary | ICD-10-CM | POA: Diagnosis not present

## 2022-07-29 DIAGNOSIS — I10 Essential (primary) hypertension: Secondary | ICD-10-CM | POA: Diagnosis present

## 2022-07-29 DIAGNOSIS — J449 Chronic obstructive pulmonary disease, unspecified: Secondary | ICD-10-CM

## 2022-07-29 DIAGNOSIS — R1110 Vomiting, unspecified: Secondary | ICD-10-CM | POA: Diagnosis not present

## 2022-07-29 DIAGNOSIS — K56609 Unspecified intestinal obstruction, unspecified as to partial versus complete obstruction: Secondary | ICD-10-CM | POA: Diagnosis present

## 2022-07-29 DIAGNOSIS — Z90710 Acquired absence of both cervix and uterus: Secondary | ICD-10-CM | POA: Diagnosis not present

## 2022-07-29 DIAGNOSIS — K460 Unspecified abdominal hernia with obstruction, without gangrene: Secondary | ICD-10-CM

## 2022-07-29 DIAGNOSIS — J4489 Other specified chronic obstructive pulmonary disease: Secondary | ICD-10-CM | POA: Diagnosis present

## 2022-07-29 DIAGNOSIS — M8580 Other specified disorders of bone density and structure, unspecified site: Secondary | ICD-10-CM | POA: Diagnosis present

## 2022-07-29 DIAGNOSIS — Z881 Allergy status to other antibiotic agents status: Secondary | ICD-10-CM

## 2022-07-29 DIAGNOSIS — Z79899 Other long term (current) drug therapy: Secondary | ICD-10-CM | POA: Diagnosis not present

## 2022-07-29 DIAGNOSIS — Z91018 Allergy to other foods: Secondary | ICD-10-CM | POA: Diagnosis not present

## 2022-07-29 DIAGNOSIS — M5137 Other intervertebral disc degeneration, lumbosacral region: Secondary | ICD-10-CM | POA: Diagnosis present

## 2022-07-29 DIAGNOSIS — R000 Tachycardia, unspecified: Secondary | ICD-10-CM | POA: Diagnosis not present

## 2022-07-29 DIAGNOSIS — Z8619 Personal history of other infectious and parasitic diseases: Secondary | ICD-10-CM

## 2022-07-29 DIAGNOSIS — Z8249 Family history of ischemic heart disease and other diseases of the circulatory system: Secondary | ICD-10-CM

## 2022-07-29 DIAGNOSIS — R1902 Left upper quadrant abdominal swelling, mass and lump: Secondary | ICD-10-CM | POA: Diagnosis not present

## 2022-07-29 DIAGNOSIS — R109 Unspecified abdominal pain: Secondary | ICD-10-CM | POA: Diagnosis not present

## 2022-07-29 DIAGNOSIS — J439 Emphysema, unspecified: Secondary | ICD-10-CM | POA: Diagnosis present

## 2022-07-29 DIAGNOSIS — I700 Atherosclerosis of aorta: Secondary | ICD-10-CM | POA: Diagnosis not present

## 2022-07-29 HISTORY — PX: VENTRAL HERNIA REPAIR: SHX424

## 2022-07-29 HISTORY — PX: LAPAROTOMY: SHX154

## 2022-07-29 LAB — CBC
HCT: 44.6 % (ref 36.0–46.0)
Hemoglobin: 14.7 g/dL (ref 12.0–15.0)
MCH: 33 pg (ref 26.0–34.0)
MCHC: 33 g/dL (ref 30.0–36.0)
MCV: 100.2 fL — ABNORMAL HIGH (ref 80.0–100.0)
Platelets: 338 10*3/uL (ref 150–400)
RBC: 4.45 MIL/uL (ref 3.87–5.11)
RDW: 12.8 % (ref 11.5–15.5)
WBC: 5.4 10*3/uL (ref 4.0–10.5)
nRBC: 0 % (ref 0.0–0.2)

## 2022-07-29 LAB — COMPREHENSIVE METABOLIC PANEL
ALT: 12 U/L (ref 0–44)
AST: 15 U/L (ref 15–41)
Albumin: 4.2 g/dL (ref 3.5–5.0)
Alkaline Phosphatase: 71 U/L (ref 38–126)
Anion gap: 11 (ref 5–15)
BUN: 32 mg/dL — ABNORMAL HIGH (ref 8–23)
CO2: 23 mmol/L (ref 22–32)
Calcium: 9.7 mg/dL (ref 8.9–10.3)
Chloride: 102 mmol/L (ref 98–111)
Creatinine, Ser: 1.13 mg/dL — ABNORMAL HIGH (ref 0.44–1.00)
GFR, Estimated: 50 mL/min — ABNORMAL LOW (ref >60)
Glucose, Bld: 96 mg/dL (ref 70–99)
Potassium: 4.3 mmol/L (ref 3.5–5.1)
Sodium: 136 mmol/L (ref 135–145)
Total Bilirubin: 1.5 mg/dL — ABNORMAL HIGH (ref 0.3–1.2)
Total Protein: 7.2 g/dL (ref 6.5–8.1)

## 2022-07-29 LAB — LIPASE, BLOOD: Lipase: 27 U/L (ref 11–51)

## 2022-07-29 SURGERY — REPAIR, HERNIA, VENTRAL
Anesthesia: General | Site: Abdomen

## 2022-07-29 MED ORDER — FENTANYL CITRATE (PF) 100 MCG/2ML IJ SOLN
INTRAMUSCULAR | Status: DC | PRN
  Administered 2022-07-29 (×2): 25 ug via INTRAVENOUS
  Administered 2022-07-29: 50 ug via INTRAVENOUS

## 2022-07-29 MED ORDER — ONDANSETRON HCL 4 MG/2ML IJ SOLN
INTRAMUSCULAR | Status: AC
  Filled 2022-07-29: qty 2

## 2022-07-29 MED ORDER — PHENYLEPHRINE 80 MCG/ML (10ML) SYRINGE FOR IV PUSH (FOR BLOOD PRESSURE SUPPORT)
PREFILLED_SYRINGE | INTRAVENOUS | Status: DC | PRN
  Administered 2022-07-29 (×2): 80 ug via INTRAVENOUS

## 2022-07-29 MED ORDER — ONDANSETRON HCL 4 MG/2ML IJ SOLN
INTRAMUSCULAR | Status: DC | PRN
  Administered 2022-07-29: 4 mg via INTRAVENOUS

## 2022-07-29 MED ORDER — BUPIVACAINE HCL (PF) 0.5 % IJ SOLN
INTRAMUSCULAR | Status: DC | PRN
  Administered 2022-07-29: 4 mL

## 2022-07-29 MED ORDER — SUCCINYLCHOLINE CHLORIDE 200 MG/10ML IV SOSY
PREFILLED_SYRINGE | INTRAVENOUS | Status: DC | PRN
  Administered 2022-07-29: 60 mg via INTRAVENOUS

## 2022-07-29 MED ORDER — BUPIVACAINE HCL (PF) 0.5 % IJ SOLN
INTRAMUSCULAR | Status: AC
  Filled 2022-07-29: qty 30

## 2022-07-29 MED ORDER — CEFAZOLIN SODIUM-DEXTROSE 2-4 GM/100ML-% IV SOLN
INTRAVENOUS | Status: AC
  Filled 2022-07-29: qty 100

## 2022-07-29 MED ORDER — LIDOCAINE 2% (20 MG/ML) 5 ML SYRINGE
INTRAMUSCULAR | Status: DC | PRN
  Administered 2022-07-29: 40 mg via INTRAVENOUS

## 2022-07-29 MED ORDER — PROPOFOL 10 MG/ML IV BOLUS
INTRAVENOUS | Status: AC
  Filled 2022-07-29: qty 20

## 2022-07-29 MED ORDER — FENTANYL CITRATE PF 50 MCG/ML IJ SOSY
25.0000 ug | PREFILLED_SYRINGE | INTRAMUSCULAR | Status: DC | PRN
  Administered 2022-07-30: 50 ug via INTRAVENOUS

## 2022-07-29 MED ORDER — KETOROLAC TROMETHAMINE 30 MG/ML IJ SOLN
INTRAMUSCULAR | Status: AC
  Filled 2022-07-29: qty 1

## 2022-07-29 MED ORDER — EPINEPHRINE PF 1 MG/ML IJ SOLN
INTRAMUSCULAR | Status: DC | PRN
  Administered 2022-07-29: .02 mL

## 2022-07-29 MED ORDER — LACTATED RINGERS IV SOLN: INTRAVENOUS | Status: DC | PRN

## 2022-07-29 MED ORDER — ONDANSETRON HCL 4 MG/2ML IJ SOLN: 4.0000 mg | Freq: Once | INTRAMUSCULAR | Status: DC | PRN

## 2022-07-29 MED ORDER — MORPHINE SULFATE (PF) 4 MG/ML IV SOLN
4.0000 mg | Freq: Once | INTRAVENOUS | Status: AC
  Administered 2022-07-29: 4 mg via INTRAVENOUS
  Filled 2022-07-29: qty 1

## 2022-07-29 MED ORDER — DEXAMETHASONE SODIUM PHOSPHATE 10 MG/ML IJ SOLN
INTRAMUSCULAR | Status: AC
  Filled 2022-07-29: qty 1

## 2022-07-29 MED ORDER — IOHEXOL 350 MG/ML SOLN: 80.0000 mL | Freq: Once | INTRAVENOUS | Status: DC | PRN

## 2022-07-29 MED ORDER — 0.9 % SODIUM CHLORIDE (POUR BTL) OPTIME
TOPICAL | Status: DC | PRN
  Administered 2022-07-29: 1000 mL

## 2022-07-29 MED ORDER — ACETAMINOPHEN 10 MG/ML IV SOLN
INTRAVENOUS | Status: DC | PRN
  Administered 2022-07-29: 1000 mg via INTRAVENOUS

## 2022-07-29 MED ORDER — IOHEXOL 300 MG/ML  SOLN
80.0000 mL | Freq: Once | INTRAMUSCULAR | Status: AC | PRN
  Administered 2022-07-29: 80 mL via INTRAVENOUS

## 2022-07-29 MED ORDER — EPINEPHRINE PF 1 MG/ML IJ SOLN
INTRAMUSCULAR | Status: AC
  Filled 2022-07-29: qty 1

## 2022-07-29 MED ORDER — ROCURONIUM BROMIDE 10 MG/ML (PF) SYRINGE
PREFILLED_SYRINGE | INTRAVENOUS | Status: DC | PRN
  Administered 2022-07-29: 20 mg via INTRAVENOUS
  Administered 2022-07-29: 10 mg via INTRAVENOUS

## 2022-07-29 MED ORDER — SUGAMMADEX SODIUM 200 MG/2ML IV SOLN
INTRAVENOUS | Status: DC | PRN
  Administered 2022-07-29: 100 mg via INTRAVENOUS

## 2022-07-29 MED ORDER — FENTANYL CITRATE (PF) 250 MCG/5ML IJ SOLN
INTRAMUSCULAR | Status: AC
  Filled 2022-07-29: qty 5

## 2022-07-29 MED ORDER — PHENYLEPHRINE 80 MCG/ML (10ML) SYRINGE FOR IV PUSH (FOR BLOOD PRESSURE SUPPORT)
PREFILLED_SYRINGE | INTRAVENOUS | Status: AC
  Filled 2022-07-29: qty 10

## 2022-07-29 MED ORDER — SODIUM CHLORIDE 0.9 % IV BOLUS
500.0000 mL | Freq: Once | INTRAVENOUS | Status: AC
  Administered 2022-07-29: 500 mL via INTRAVENOUS

## 2022-07-29 MED ORDER — DEXAMETHASONE SODIUM PHOSPHATE 4 MG/ML IJ SOLN
INTRAMUSCULAR | Status: DC | PRN
  Administered 2022-07-29: 8 mg via INTRAVENOUS

## 2022-07-29 MED ORDER — LIDOCAINE HCL (PF) 2 % IJ SOLN
INTRAMUSCULAR | Status: AC
  Filled 2022-07-29: qty 5

## 2022-07-29 MED ORDER — ACETAMINOPHEN 10 MG/ML IV SOLN
INTRAVENOUS | Status: AC
  Filled 2022-07-29: qty 100

## 2022-07-29 MED ORDER — KETOROLAC TROMETHAMINE 30 MG/ML IJ SOLN
30.0000 mg | Freq: Once | INTRAMUSCULAR | Status: AC
  Administered 2022-07-29: 30 mg via INTRAVENOUS

## 2022-07-29 MED ORDER — PROPOFOL 10 MG/ML IV BOLUS
INTRAVENOUS | Status: DC | PRN
  Administered 2022-07-29: 100 mg via INTRAVENOUS

## 2022-07-29 MED ORDER — ONDANSETRON HCL 4 MG/2ML IJ SOLN
4.0000 mg | Freq: Once | INTRAMUSCULAR | Status: AC
  Administered 2022-07-29: 4 mg via INTRAVENOUS
  Filled 2022-07-29: qty 2

## 2022-07-29 SURGICAL SUPPLY — 38 items
BAG COUNTER SPONGE SURGICOUNT (BAG) IMPLANT
BINDER ABDOMINAL 12 ML 46-62 (SOFTGOODS) IMPLANT
BLADE HEX COATED 2.75 (ELECTRODE) ×2 IMPLANT
DERMABOND ADVANCED .7 DNX12 (GAUZE/BANDAGES/DRESSINGS) IMPLANT
DRAIN CHANNEL RND F F (WOUND CARE) IMPLANT
DRAPE LAPAROSCOPIC ABDOMINAL (DRAPES) ×2 IMPLANT
ELECT REM PT RETURN 15FT ADLT (MISCELLANEOUS) ×2 IMPLANT
EVACUATOR SILICONE 100CC (DRAIN) IMPLANT
GAUZE SPONGE 4X4 12PLY STRL (GAUZE/BANDAGES/DRESSINGS) ×2 IMPLANT
GLOVE BIO SURGEON STRL SZ7.5 (GLOVE) ×4 IMPLANT
GLOVE BIO SURGEON STRL SZ8 (GLOVE) IMPLANT
GLOVE BIOGEL PI IND STRL 7.0 (GLOVE) ×2 IMPLANT
GLOVE BIOGEL PI IND STRL 8 (GLOVE) IMPLANT
GOWN STRL REUS W/ TWL LRG LVL3 (GOWN DISPOSABLE) ×2 IMPLANT
GOWN STRL REUS W/ TWL XL LVL3 (GOWN DISPOSABLE) ×2 IMPLANT
GOWN STRL REUS W/TWL LRG LVL3 (GOWN DISPOSABLE) ×2 IMPLANT
GOWN STRL REUS W/TWL XL LVL3 (GOWN DISPOSABLE) ×2 IMPLANT
KIT BASIN OR (CUSTOM PROCEDURE TRAY) ×2 IMPLANT
KIT TURNOVER KIT A (KITS) IMPLANT
NDL HYPO 25X1 1.5 SAFETY (NEEDLE) IMPLANT
NEEDLE HYPO 25X1 1.5 SAFETY (NEEDLE) IMPLANT
PACK GENERAL/GYN (CUSTOM PROCEDURE TRAY) ×2 IMPLANT
SPIKE FLUID TRANSFER (MISCELLANEOUS) IMPLANT
STAPLER VISISTAT 35W (STAPLE) IMPLANT
SUT ETHILON 3 0 PS 1 (SUTURE) IMPLANT
SUT MNCRL AB 4-0 PS2 18 (SUTURE) IMPLANT
SUT NOVA 1 T20/GS 25DT (SUTURE) IMPLANT
SUT NOVA NAB GS-21 1 T12 (SUTURE) IMPLANT
SUT PROLENE 0 CT 1 CR/8 (SUTURE) IMPLANT
SUT SILK 3 0 (SUTURE)
SUT SILK 3-0 18XBRD TIE 12 (SUTURE) IMPLANT
SUT VIC AB 2-0 CT1 27 (SUTURE) ×4
SUT VIC AB 2-0 CT1 27XBRD (SUTURE) IMPLANT
SUT VIC AB 3-0 SH 27 (SUTURE)
SUT VIC AB 3-0 SH 27XBRD (SUTURE) IMPLANT
SYR CONTROL 10ML LL (SYRINGE) IMPLANT
TOWEL OR 17X26 10 PK STRL BLUE (TOWEL DISPOSABLE) ×2 IMPLANT
TRAY FOLEY W/BAG SLVR 14FR LF (SET/KITS/TRAYS/PACK) IMPLANT

## 2022-07-29 NOTE — H&P (Signed)
Jenny Oliver is an 77 y.o. female.   Chief Complaint: abd pain HPI: The patient is a 77 year old black female who has been having abdominal pain for the last 2 weeks.  She finally came to the emergency department tonight where a CT scan showed what looks like an incarcerated left lower quadrant ventral hernia with bowel obstruction.  She denies any significant nausea or vomiting.  She has not had a bowel movement in 2 days.  She has not passed flatus today.  She is still smoking.  Past Medical History:  Diagnosis Date   Abnormality of gait    due to balance instability,  episodic   Benign paroxysmal positional vertigo    Bilateral lower extremity edema    Chronic insomnia 04/24/2015   COPD with emphysema (HCC)    last chest CT 01-12-2018   DDD (degenerative disc disease), lumbosacral    Full dentures    GERD (gastroesophageal reflux disease)    no meds   Headache    Hiatal hernia    History of esophageal dilatation    2013 balloon dilatation   History of Helicobacter pylori infection    Hypertensive chronic kidney disease with stage 1 through stage 4 chronic kidney disease, or unspecified chronic kidney disease    per pt pcp note in epic   IBS (irritable bowel syndrome)    IBS (irritable bowel syndrome)    Liddle's syndrome    hypertension treated with potassium sparing diuretic, amiloride   Meningioma (HCC)    left cavernous sinus, MRI 09-16-2015 in epic;  followed by neurologist, dr Anne Hahn   OA (osteoarthritis)    Right ureteral stone    Sciatica    Smokers' cough (HCC)    occasionally productive   Vitamin B12 deficiency    Vitamin D deficiency     Past Surgical History:  Procedure Laterality Date   BALLOON DILATION  11/03/2011   Procedure: BALLOON DILATION;  Surgeon: Willis Modena, MD;  Location: [facility];  Service: Endoscopy;  Laterality: N/A;   CYSTOSCOPY WITH URETEROSCOPY AND STENT PLACEMENT Right 10/09/2018   Procedure: CYSTOSCOPY WITH RIGHT  URETEROSCOPY HOLMIUN  LASER , RETROGRADE AND STENT PLACEMENT;  Surgeon: Crista Elliot, MD;  Location: [facility];  Service: Urology;  Laterality: Right;   TONSILLECTOMY  child   VAGINAL HYSTERECTOMY  1994    Family History  Problem Relation Age of Onset   Heart disease Mother    Hyperlipidemia Mother    Cancer Father        stomach   Healthy Brother    Social History:  reports that she has been smoking cigarettes. She has a 72.00 pack-year smoking history. She has never used smokeless tobacco. She reports that she does not drink alcohol and does not use drugs.  Allergies:  Allergies  Allergen Reactions   Alpha Blocker Quinazolines Other (See Comments)   Clindamycin Other (See Comments)   Fluticasone Dermatitis and Other (See Comments)   Guaifenesin Er Other (See Comments)   [place] Bread [Wolfiporia Cocos] Other (See Comments)   Lipitor [Atorvastatin] Nausea And Vomiting   Loratadine Other (See Comments)   Milk-Related Compounds Other (See Comments)   Other     Per pt avoids Bread, peanuts/tree nuts due to bloating  opioids causes nausea and vomiting Lactose intolerance   Peanut-Containing Drug Products Other (See Comments)   Rosuvastatin Nausea Only and Other (See Comments)   Zetia [Ezetimibe]     (Not in a hospital admission)  Results for orders placed or performed during the hospital encounter of 07/29/22 (from the past 48 hour(s))  Lipase, blood     Status: None   Collection Time: 07/29/22  5:36 PM  Result Value Ref Range   Lipase 27 11 - 51 U/L    Comment: Performed at [facility], [address], [place], [place] [address]  Comprehensive metabolic panel     Status: Abnormal   Collection Time: 07/29/22  5:36 PM  Result Value Ref Range   Sodium 136 135 - 145 mmol/L   Potassium 4.3 3.5 - 5.1 mmol/L   Chloride 102 98 - 111 mmol/L   CO2 23 22 - 32 mmol/L   Glucose, Bld 96 70 - 99 mg/dL    Comment: Glucose reference range applies only to samples  taken after fasting for at least 8 hours.   BUN 32 (H) 8 - 23 mg/dL   Creatinine, Ser 6.04 (H) 0.44 - 1.00 mg/dL   Calcium 9.7 8.9 - 54.0 mg/dL   Total Protein 7.2 6.5 - 8.1 g/dL   Albumin 4.2 3.5 - 5.0 g/dL   AST 15 15 - 41 U/L   ALT 12 0 - 44 U/L   Alkaline Phosphatase 71 38 - 126 U/L   Total Bilirubin 1.5 (H) 0.3 - 1.2 mg/dL   GFR, Estimated 50 (L) >60 mL/min    Comment: (NOTE) Calculated using the CKD-EPI Creatinine Equation (2021)    Anion gap 11 5 - 15    Comment: Performed at [facility], [address], [place], [place] [address]  CBC     Status: Abnormal   Collection Time: 07/29/22  5:36 PM  Result Value Ref Range   WBC 5.4 4.0 - 10.5 K/uL   RBC 4.45 3.87 - 5.11 MIL/uL   Hemoglobin 14.7 12.0 - 15.0 g/dL   HCT 14.7 82.9 - 56.2 %   MCV 100.2 (H) 80.0 - 100.0 fL   MCH 33.0 26.0 - 34.0 pg   MCHC 33.0 30.0 - 36.0 g/dL   RDW 13.0 86.5 - 78.4 %   Platelets 338 150 - 400 K/uL   nRBC 0.0 0.0 - 0.2 %    Comment: Performed at [facility], [address] [place], [place] [address]   CT ABDOMEN PELVIS W CONTRAST  Result Date: 07/29/2022 CLINICAL DATA:  Low abdominal pain with nausea vomiting EXAM: CT ABDOMEN AND PELVIS WITH CONTRAST TECHNIQUE: Multidetector CT imaging of the abdomen and pelvis was performed using the standard protocol following bolus administration of intravenous contrast. RADIATION DOSE REDUCTION: This exam was performed according to the departmental dose-optimization program which includes automated exposure control, adjustment of the mA and/or kV according to patient size and/or use of iterative reconstruction technique. CONTRAST:  <See Chart> OMNIPAQUE IOHEXOL 350 MG/ML SOLN, 80mL OMNIPAQUE IOHEXOL 300 MG/ML SOLN COMPARISON:  CT 04/03/2019 FINDINGS: Lower chest: Lung bases demonstrate no acute airspace disease. Moderate hiatal hernia. Hepatobiliary: No focal liver abnormality is seen. No gallstones, gallbladder wall thickening,  or biliary dilatation. Pancreas: Unremarkable. No pancreatic ductal dilatation or surrounding inflammatory changes. Spleen: Normal in size without focal abnormality. Adrenals/Urinary Tract: Adrenal glands are unremarkable. Kidneys are normal, without renal calculi, focal lesion, or hydronephrosis. Bladder is unremarkable. Stomach/Bowel: Stomach nonenlarged. Multiple dilated fluid-filled loops of proximal to mid small bowel measuring up to 4 cm. Transition point related to a left lower quadrant ventral (spigelian) hernia. Short segment of herniated small bowel demonstrates wall thickening. There is fluid in  the hernia sac. The bowel distal to the hernia is decompressed. Vascular/Lymphatic: Moderate aortic atherosclerosis. No aneurysm. No suspicious lymph nodes Reproductive: Prostate is unremarkable. Other: Small free fluid. No free air. Small fat containing umbilical and periumbilical hernia. Musculoskeletal: No acute osseous abnormality. Grade 1 anterolisthesis L4 on L5 and L5 on S1. Degenerative changes IMPRESSION: 1. Findings consistent with small-bowel obstruction with transition point related to end incarcerated left lower quadrant ventral (spigelian) hernia. No perforation. No intramural air 2. Small free fluid in the abdomen and pelvis. Moderate hiatal hernia. 3. Aortic atherosclerosis. Aortic Atherosclerosis (ICD10-I70.0). Electronically Signed   By: Jasmine Pang M.D.   On: 07/29/2022 19:30    Review of Systems  Constitutional: Negative.   HENT: Negative.    Eyes: Negative.   Respiratory: Negative.    Cardiovascular: Negative.   Gastrointestinal:  Positive for abdominal pain.  Endocrine: Negative.   Genitourinary: Negative.   Musculoskeletal: Negative.   Skin: Negative.   Allergic/Immunologic: Negative.   Neurological: Negative.   Hematological: Negative.   Psychiatric/Behavioral: Negative.      Blood pressure 134/75, pulse 95, temperature 98.5 F (36.9 C), resp. rate 17, height 5\' 3"   (1.6 m), weight 47.2 kg, SpO2 91 %. Physical Exam Vitals reviewed.  Constitutional:      General: She is not in acute distress.    Appearance: Normal appearance.  HENT:     Head: Normocephalic and atraumatic.     Right Ear: External ear normal.     Left Ear: External ear normal.     Nose: Nose normal.     Mouth/Throat:     Mouth: Mucous membranes are dry.     Pharynx: Oropharynx is clear.  Eyes:     General: No scleral icterus.    Extraocular Movements: Extraocular movements intact.     Conjunctiva/sclera: Conjunctivae normal.     Pupils: Pupils are equal, round, and reactive to light.  Cardiovascular:     Rate and Rhythm: Normal rate and regular rhythm.     Pulses: Normal pulses.     Heart sounds: Normal heart sounds.  Pulmonary:     Effort: Pulmonary effort is normal. No respiratory distress.     Breath sounds: Normal breath sounds.  Abdominal:     General: Abdomen is flat.     Palpations: Abdomen is soft.     Comments: Focal LLQ tenderness. Can not get hernia to reduce  Musculoskeletal:        General: No swelling or deformity. Normal range of motion.     Cervical back: Normal range of motion. No tenderness.  Skin:    General: Skin is warm and dry.     Coloration: Skin is not jaundiced.  Neurological:     General: No focal deficit present.     Mental Status: She is alert and oriented to person, place, and time.  Psychiatric:        Mood and Affect: Mood normal.        Behavior: Behavior normal.      Assessment/Plan The patient appears to have an incarcerated left lower quadrant ventral hernia causing a bowel obstruction.  Since I cannot reduce the hernia I have recommended that she have surgery tonight to reduce the bowel and repair the hernia.  If the bowel is compromised then it is possible that she may need a segmental resection of the small bowel.  I have discussed with her in detail the risks and benefits of the operation as well as some of  the technical aspects  and she understands and wishes to proceed.  Chevis Pretty III, MD 07/29/2022, 9:22 PM

## 2022-07-29 NOTE — Anesthesia Procedure Notes (Signed)
Procedure Name: Intubation Date/Time: 07/29/2022 10:36 PM  Performed by: Ludwig Lean, CRNAPre-anesthesia Checklist: Patient identified, Emergency Drugs available, Suction available and Patient being monitored Patient Re-evaluated:Patient Re-evaluated prior to induction Oxygen Delivery Method: Circle system utilized Preoxygenation: Pre-oxygenation with 100% oxygen Induction Type: IV induction, Rapid sequence and Cricoid Pressure applied Laryngoscope Size: Mac and 3 Grade View: Grade I Tube type: Oral Tube size: 7.0 mm Number of attempts: 1 Airway Equipment and Method: Stylet Placement Confirmation: ETT inserted through vocal cords under direct vision, positive ETCO2 and breath sounds checked- equal and bilateral Secured at: 21 cm Tube secured with: Tape Dental Injury: Teeth and Oropharynx as per pre-operative assessment

## 2022-07-29 NOTE — Anesthesia Preprocedure Evaluation (Signed)
Anesthesia Evaluation  Patient identified by MRN, date of birth, ID band Patient awake    Reviewed: Allergy & Precautions, NPO status , Patient's Chart, lab work & pertinent test results  Airway Mallampati: II  TM Distance: >3 FB Neck ROM: Full    Dental  (+) Dental Advisory Given, Lower Dentures, Upper Dentures   Pulmonary COPD,  COPD inhaler, Current Smoker   Pulmonary exam normal breath sounds clear to auscultation       Cardiovascular hypertension, Pt. on medications + CAD  Normal cardiovascular exam Rhythm:Regular Rate:Normal     Neuro/Psych  Headaches Meningioma   Neuromuscular disease    GI/Hepatic Neg liver ROS, hiatal hernia,GERD  ,, INCARCERATED VENTRAL HERNIA   Endo/Other  negative endocrine ROS    Renal/GU Renal InsufficiencyRenal disease     Musculoskeletal negative musculoskeletal ROS (+)    Abdominal   Peds  Hematology negative hematology ROS (+)   Anesthesia Other Findings Day of surgery medications reviewed with the patient.  Reproductive/Obstetrics                             Anesthesia Physical Anesthesia Plan  ASA: 3 and emergent  Anesthesia Plan: General   Post-op Pain Management: Ofirmev IV (intra-op)* and Toradol IV (intra-op)*   Induction: Intravenous, Rapid sequence and Cricoid pressure planned  PONV Risk Score and Plan: 3 and Dexamethasone and Ondansetron  Airway Management Planned: Oral ETT  Additional Equipment:   Intra-op Plan:   Post-operative Plan: Possible Post-op intubation/ventilation  Informed Consent: I have reviewed the patients History and Physical, chart, labs and discussed the procedure including the risks, benefits and alternatives for the proposed anesthesia with the patient or authorized representative who has indicated his/her understanding and acceptance.     Dental advisory given  Plan Discussed with: CRNA  Anesthesia Plan  Comments:        Anesthesia Quick Evaluation

## 2022-07-29 NOTE — ED Provider Notes (Signed)
[place] EMERGENCY DEPARTMENT AT [facility] Provider Note   CSN: [id number] Arrival date & time: 07/29/22  1701     History  Chief Complaint  Patient presents with   Abdominal Pain    Jenny Oliver is a 77 y.o. female.  Patient with history of GERD, IBS, tobacco abuse presents today with complaints of abdominal pain.  She states that she has had severe pain for 6 days.  Pain is in the left lower quadrant.  She has not had a bowel movement in 2 days which is unusual for her.  She took Colace and MiraLAX without improvement.  She has not eaten anything in 2 days and started vomiting earlier today.  She endorses a history of a hiatal hernia, no history of any other known hernias.  She has not passed flatus today.  Denies fevers or chills.  No history of similar symptoms previously.  She went to her primary care doctor earlier today who was concerned for dehydration and pain with a palpable mass in the left lower quadrant and sent her here for admission.  The history is provided by the patient. No language interpreter was used.  Abdominal Pain      Home Medications Prior to Admission medications   Medication Sig Start Date End Date Taking? Authorizing Provider  acetaminophen (TYLENOL) 650 MG CR tablet Take 1,300 mg by mouth daily.     [provider]  albuterol (VENTOLIN HFA) 108 (90 Base) MCG/ACT inhaler 2 puffs 05/18/21   [provider]  aMILoride (MIDAMOR) 5 MG tablet Take 5 mg by mouth 2 (two) times daily.     [provider]  aspirin EC 81 MG tablet Take 1 tablet (81 mg total) by mouth daily. Swallow whole. 04/29/20   Lyn Records, MD  Coenzyme Q10 (CO Q 10) 10 MG CAPS daily.    [provider]  Evolocumab (REPATHA SURECLICK) 140 MG/ML SOAJ Inject 140 mg into the skin every 14 (fourteen) days. 07/14/22   Meriam Sprague, MD  GLUCOSAMINE-CHONDROITIN DS PO Take 1 tablet by mouth in the morning and at bedtime.    [provider]  Tiotropium Bromide Monohydrate (SPIRIVA RESPIMAT) 2.5 MCG/ACT AERS Inhale 2 puffs into the lungs daily. 07/16/22   Luciano Cutter, MD      Allergies    Alpha blocker quinazolines, Clindamycin, Fluticasone, Guaifenesin er, [place] bread [wolfiporia cocos], Lipitor [atorvastatin], Loratadine, Milk-related compounds, Other, Peanut-containing drug products, Rosuvastatin, and Zetia [ezetimibe]    Review of Systems   Review of Systems  Gastrointestinal:  Positive for abdominal pain.  All other systems reviewed and are negative.   Physical Exam Updated Vital Signs BP 134/75   Pulse 95   Temp 98.5 F (36.9 C)   Resp 17   Ht 5\' 3"  (1.6 m)   Wt 47.2 kg   SpO2 91%   BMI 18.42 kg/m  Physical Exam Vitals and nursing note reviewed.  Constitutional:      General: She is not in acute distress.    Appearance: Normal appearance. She is normal weight. She is not ill-appearing, toxic-appearing or diaphoretic.  HENT:     Head: Normocephalic and atraumatic.  Cardiovascular:     Rate and Rhythm: Normal rate.  Pulmonary:     Effort: Pulmonary effort is normal. No respiratory distress.  Abdominal:     General: There is distension.     Palpations: Abdomen is soft.     Tenderness: There is abdominal tenderness  in the left lower quadrant.     Hernia: A hernia is present. Hernia is present in the ventral area.  Musculoskeletal:        General: Normal range of motion.     Cervical back: Normal range of motion.  Skin:    General: Skin is warm and dry.  Neurological:     General: No focal deficit present.     Mental Status: She is alert.  Psychiatric:        Mood and Affect: Mood normal.        Behavior: Behavior normal.     ED Results / Procedures / Treatments   Labs (all labs ordered are listed, but only abnormal results are displayed) Labs Reviewed  COMPREHENSIVE METABOLIC PANEL - Abnormal; Notable for the following components:      Result Value   BUN 32 (*)     Creatinine, Ser 1.13 (*)    Total Bilirubin 1.5 (*)    GFR, Estimated 50 (*)    All other components within normal limits  CBC - Abnormal; Notable for the following components:   MCV 100.2 (*)    All other components within normal limits  LIPASE, BLOOD  URINALYSIS, ROUTINE W REFLEX MICROSCOPIC    EKG EKG Interpretation  Date/Time:  Thursday Jul 29 2022 17:20:53 EDT Ventricular Rate:  112 PR Interval:  124 QRS Duration: 80 QT Interval:  327 QTC Calculation: 447 R Axis:   -11 Text Interpretation: Sinus tachycardia Low voltage, extremity leads Minimal ST depression, inferior leads Confirmed by Kristine Royal [phone]) on 07/29/2022 6:33:42 PM  Radiology CT ABDOMEN PELVIS W CONTRAST  Result Date: 07/29/2022 CLINICAL DATA:  Low abdominal pain with nausea vomiting EXAM: CT ABDOMEN AND PELVIS WITH CONTRAST TECHNIQUE: Multidetector CT imaging of the abdomen and pelvis was performed using the standard protocol following bolus administration of intravenous contrast. RADIATION DOSE REDUCTION: This exam was performed according to the departmental dose-optimization program which includes automated exposure control, adjustment of the mA and/or kV according to patient size and/or use of iterative reconstruction technique. CONTRAST:  <See Chart> OMNIPAQUE IOHEXOL 350 MG/ML SOLN, 80mL OMNIPAQUE IOHEXOL 300 MG/ML SOLN COMPARISON:  CT 04/03/2019 FINDINGS: Lower chest: Lung bases demonstrate no acute airspace disease. Moderate hiatal hernia. Hepatobiliary: No focal liver abnormality is seen. No gallstones, gallbladder wall thickening, or biliary dilatation. Pancreas: Unremarkable. No pancreatic ductal dilatation or surrounding inflammatory changes. Spleen: Normal in size without focal abnormality. Adrenals/Urinary Tract: Adrenal glands are unremarkable. Kidneys are normal, without renal calculi, focal lesion, or hydronephrosis. Bladder is unremarkable. Stomach/Bowel: Stomach nonenlarged. Multiple dilated  fluid-filled loops of proximal to mid small bowel measuring up to 4 cm. Transition point related to a left lower quadrant ventral (spigelian) hernia. Short segment of herniated small bowel demonstrates wall thickening. There is fluid in the hernia sac. The bowel distal to the hernia is decompressed. Vascular/Lymphatic: Moderate aortic atherosclerosis. No aneurysm. No suspicious lymph nodes Reproductive: Prostate is unremarkable. Other: Small free fluid. No free air. Small fat containing umbilical and periumbilical hernia. Musculoskeletal: No acute osseous abnormality. Grade 1 anterolisthesis L4 on L5 and L5 on S1. Degenerative changes IMPRESSION: 1. Findings consistent with small-bowel obstruction with transition point related to end incarcerated left lower quadrant ventral (spigelian) hernia. No perforation. No intramural air 2. Small free fluid in the abdomen and pelvis. Moderate hiatal hernia. 3. Aortic atherosclerosis. Aortic Atherosclerosis (ICD10-I70.0). Electronically Signed   By: Jasmine Pang M.D.   On: 07/29/2022 19:30    Procedures .Critical Care  Performed by: Silva Bandy, PA-C Authorized by: Silva Bandy, PA-C   Critical care provider statement:    Critical care time (minutes):  30   Critical care start time:  07/29/2022 9:00 PM   Critical care end time:  07/29/2022 9:30 PM   Critical care was necessary to treat or prevent imminent or life-threatening deterioration of the following conditions:  Dehydration (incarcerated hernia not reducible with small bowel obstruction)   Critical care was time spent personally by me on the following activities:  Development of treatment plan with patient or surrogate, discussions with consultants, evaluation of patient's response to treatment, examination of patient, obtaining history from patient or surrogate, ordering and review of laboratory studies, ordering and review of radiographic studies, pulse oximetry, re-evaluation of patient's condition and  review of old charts   Care discussed with: admitting provider       Medications Ordered in ED Medications  iohexol (OMNIPAQUE) 350 MG/ML injection 80 mL ( Intravenous Canceled Entry 07/29/22 1830)  sodium chloride 0.9 % bolus 500 mL (500 mLs Intravenous Bolus 07/29/22 2022)  iohexol (OMNIPAQUE) 300 MG/ML solution 80 mL (80 mLs Intravenous Contrast Given 07/29/22 1831)  morphine (PF) 4 MG/ML injection 4 mg (4 mg Intravenous Given 07/29/22 2022)  ondansetron (ZOFRAN) injection 4 mg (4 mg Intravenous Given 07/29/22 2021)    ED Course/ Medical Decision Making/ A&P                             Medical Decision Making Amount and/or Complexity of Data Reviewed Labs: ordered. Radiology: ordered.  Risk Prescription drug management.   This patient is a 77 y.o. female who presents to the ED for concern of abdominal pain, this involves an extensive number of treatment options, and is a complaint that carries with it a high risk of complications and morbidity. The emergent differential diagnosis prior to evaluation includes, but is not limited to,  AAA, gastroenteritis, appendicitis, Bowel obstruction, Bowel perforation. Gastroparesis, DKA, Hernia, Inflammatory bowel disease, mesenteric ischemia, pancreatitis, peritonitis SBP, volvulus.  This is not an exhaustive differential.   Past Medical History / Co-morbidities / Social History: history of GERD, IBS, tobacco abuse  Additional history: Chart reviewed. Pertinent results include: Sent by her primary care doctor Dr. Renne Crigler for admission for dehydration and concern for left abdominal mass  Physical Exam: Physical exam performed. The pertinent findings include: Left-sided abdominal wall mass which is tender to palpation, unable to reduce. No overlying skin changes  Lab Tests: I ordered, and personally interpreted labs.  The pertinent results include: BUN 32, creatinine 1.13 up from 10 and 0.77 7 months ago.  T. bili 1.5   Imaging Studies: I  ordered imaging studies including CT abdomen pelvis. I independently visualized and interpreted imaging which showed   1. Findings consistent with small-bowel obstruction with transition point related to end incarcerated left lower quadrant ventral (spigelian) hernia. No perforation. No intramural air 2. Small free fluid in the abdomen and pelvis. Moderate hiatal hernia. 3. Aortic atherosclerosis.  I agree with the radiologist interpretation.   Cardiac Monitoring:  The patient was maintained on a cardiac monitor.  My attending physician Dr. Rodena Medin viewed and interpreted the cardiac monitored which showed an underlying rhythm of: sinus tachycardia. I agree with this interpretation.   Medications: I ordered medication including fluids, zofran, morphine  for dehydration, nausea, pain. Reevaluation of the patient after these medicines showed that the patient improved. I have reviewed the  patients home medicines and have made adjustments as needed.  Consultations Obtained: I requested consultation with the general surgery Dr. Carolynne Edouard,  and discussed lab and imaging findings as well as pertinent plan - they recommend: they will admit and take the patient to the OR this evening for hernia reduction and repair   Disposition: After consideration of the diagnostic results and the patients response to treatment, I feel that patient will require admission for surgical repair of her hernia. Discussed with patient who is understanding and in agreement.  I discussed this case with my attending physician Dr. Rodena Medin who cosigned this note including patient's presenting symptoms, physical exam, and planned diagnostics and interventions. Attending physician stated agreement with plan or made changes to plan which were implemented.     Final Clinical Impression(s) / ED Diagnoses Final diagnoses:  Incarcerated ventral hernia  Abdominal hernia with obstruction    Rx / DC Orders ED Discharge Orders      None         Vear Clock 07/29/22 2210    Wynetta Fines, MD 07/29/22 2358

## 2022-07-29 NOTE — ED Triage Notes (Signed)
Referred by MD for  left lower abd pain x2 days with n/v Denies diarrhea.  Also c/o frequent belching.

## 2022-07-29 NOTE — Op Note (Signed)
07/29/2022  11:39 PM  PATIENT:  Jenny Oliver  77 y.o. female  PRE-OPERATIVE DIAGNOSIS:  INCARCERATED VENTRAL HERNIA  POST-OPERATIVE DIAGNOSIS:  INCARCERATED VENTRAL HERNIA  PROCEDURE:  Procedure(s): EXPLORATORY LAPAROTOMY, REPAIR VENTRAL HERNIA  SURGEON:  Surgeon(s) and Role:    * Griselda Miner, MD - Primary  PHYSICIAN ASSISTANT:   ASSISTANTS: none   ANESTHESIA:   local and general  EBL:  50 mL   BLOOD ADMINISTERED:none  DRAINS: none   LOCAL MEDICATIONS USED:  MARCAINE     SPECIMEN:  No Specimen  DISPOSITION OF SPECIMEN:  N/A  COUNTS:  YES  TOURNIQUET:  * No tourniquets in log *  DICTATION: .Dragon Dictation  After informed consent was obtained the patient was brought to the operating room and placed in the supine position on the operating table.  After adequate induction of general anesthesia the patient's abdomen was prepped with ChloraPrep, allowed to dry, and draped in usual sterile manner.  An appropriate timeout was performed.  The left lower quadrant had a palpable incarcerated hernia.  The area overlying this was infiltrated with quarter percent Marcaine.  A small transversely oriented incision was made with a 10 blade knife overlying the palpable hernia.  The incision was carried through the skin and subcutaneous tissue sharply with the electrocautery until the dissection reached the external oblique fascia.  The external oblique fascia was then opened transversely.  At this point I was able to identify the hernia sac.  The bowel at this point reduced spontaneously.  The hernia sac was opened and we tried to look intra-abdominal he to make sure that the small bowel was not compromised.  All of the loops of small bowel that I could see appeared to be viable and pink.  At this point the hernia sac was closed with a 0 Vicryl stitch.  The deeper fascial defect was then closed with interrupted #1 Novafil stitches.  The external oblique fascia was then closed with a running  #1 Novafil stitch.  The subcutaneous tissue was irrigated with copious amounts of saline.  The subcutaneous fascia was closed with a running 2-0 Vicryl stitch.  The skin was then closed with a running 4-0 Monocryl subcuticular stitch.  Dermabond dressings were applied.  The patient tolerated the procedure well.  At the end of the case all needle sponge and instrument counts were correct.  The patient was then awakened and taken to recovery in stable condition.  PLAN OF CARE: Admit to inpatient   PATIENT DISPOSITION:  PACU - hemodynamically stable.   Delay start of Pharmacological VTE agent (>24hrs) due to surgical blood loss or risk of bleeding: no

## 2022-07-30 ENCOUNTER — Encounter (HOSPITAL_COMMUNITY): Payer: Self-pay | Admitting: General Surgery

## 2022-07-30 LAB — BASIC METABOLIC PANEL
Anion gap: 9 (ref 5–15)
BUN: 24 mg/dL — ABNORMAL HIGH (ref 8–23)
CO2: 21 mmol/L — ABNORMAL LOW (ref 22–32)
Calcium: 8.3 mg/dL — ABNORMAL LOW (ref 8.9–10.3)
Chloride: 106 mmol/L (ref 98–111)
Creatinine, Ser: 0.77 mg/dL (ref 0.44–1.00)
GFR, Estimated: >60 mL/min (ref >60)
Glucose, Bld: 96 mg/dL (ref 70–99)
Potassium: 3.9 mmol/L (ref 3.5–5.1)
Sodium: 136 mmol/L (ref 135–145)

## 2022-07-30 LAB — CBC
HCT: 35.8 % — ABNORMAL LOW (ref 36.0–46.0)
Hemoglobin: 11.5 g/dL — ABNORMAL LOW (ref 12.0–15.0)
MCH: 33 pg (ref 26.0–34.0)
MCHC: 32.1 g/dL (ref 30.0–36.0)
MCV: 102.9 fL — ABNORMAL HIGH (ref 80.0–100.0)
Platelets: 265 10*3/uL (ref 150–400)
RBC: 3.48 MIL/uL — ABNORMAL LOW (ref 3.87–5.11)
RDW: 12.7 % (ref 11.5–15.5)
WBC: 5.9 10*3/uL (ref 4.0–10.5)
nRBC: 0 % (ref 0.0–0.2)

## 2022-07-30 MED ORDER — OXYCODONE HCL 5 MG PO TABS: 5.0000 mg | ORAL_TABLET | ORAL | Status: DC | PRN

## 2022-07-30 MED ORDER — ACETAMINOPHEN 500 MG PO TABS
1000.0000 mg | ORAL_TABLET | Freq: Four times a day (QID) | ORAL | Status: DC
  Administered 2022-07-30 – 2022-08-02 (×10): 1000 mg via ORAL
  Filled 2022-07-30 (×10): qty 2

## 2022-07-30 MED ORDER — UMECLIDINIUM BROMIDE 62.5 MCG/ACT IN AEPB
1.0000 | INHALATION_SPRAY | Freq: Every day | RESPIRATORY_TRACT | Status: DC
  Administered 2022-07-30 – 2022-08-02 (×4): 1 via RESPIRATORY_TRACT
  Filled 2022-07-30: qty 7

## 2022-07-30 MED ORDER — FENTANYL CITRATE PF 50 MCG/ML IJ SOSY
PREFILLED_SYRINGE | INTRAMUSCULAR | Status: AC
  Filled 2022-07-30: qty 2

## 2022-07-30 MED ORDER — PANTOPRAZOLE SODIUM 40 MG IV SOLR
40.0000 mg | Freq: Every day | INTRAVENOUS | Status: DC
  Administered 2022-07-30 – 2022-08-01 (×4): 40 mg via INTRAVENOUS
  Filled 2022-07-30 (×4): qty 10

## 2022-07-30 MED ORDER — ONDANSETRON 4 MG PO TBDP: 4.0000 mg | ORAL_TABLET | Freq: Four times a day (QID) | ORAL | Status: DC | PRN

## 2022-07-30 MED ORDER — ENOXAPARIN SODIUM 30 MG/0.3ML IJ SOSY
30.0000 mg | PREFILLED_SYRINGE | INTRAMUSCULAR | Status: DC
  Administered 2022-07-30 – 2022-08-02 (×4): 30 mg via SUBCUTANEOUS
  Filled 2022-07-30 (×4): qty 0.3

## 2022-07-30 MED ORDER — MORPHINE SULFATE (PF) 2 MG/ML IV SOLN: 1.0000 mg | INTRAVENOUS | Status: DC | PRN

## 2022-07-30 MED ORDER — PANTOPRAZOLE SODIUM 40 MG IV SOLR: 40.0000 mg | Freq: Every day | INTRAVENOUS | Status: DC

## 2022-07-30 MED ORDER — ALBUTEROL SULFATE (2.5 MG/3ML) 0.083% IN NEBU: 3.0000 mL | INHALATION_SOLUTION | Freq: Four times a day (QID) | RESPIRATORY_TRACT | Status: DC | PRN

## 2022-07-30 MED ORDER — MIRTAZAPINE 15 MG PO TABS: 15.0000 mg | ORAL_TABLET | Freq: Every day | ORAL | Status: DC

## 2022-07-30 MED ORDER — SODIUM CHLORIDE 0.9 % IV SOLN: INTRAVENOUS | Status: DC

## 2022-07-30 MED ORDER — TIOTROPIUM BROMIDE MONOHYDRATE 2.5 MCG/ACT IN AERS: 2.0000 | INHALATION_SPRAY | Freq: Every day | RESPIRATORY_TRACT | Status: DC

## 2022-07-30 MED ORDER — ENOXAPARIN SODIUM 30 MG/0.3ML IJ SOSY: 30.0000 mg | PREFILLED_SYRINGE | INTRAMUSCULAR | Status: DC

## 2022-07-30 MED ORDER — ONDANSETRON HCL 4 MG/2ML IJ SOLN: 4.0000 mg | Freq: Four times a day (QID) | INTRAMUSCULAR | Status: DC | PRN

## 2022-07-30 NOTE — Evaluation (Signed)
Physical Therapy Evaluation Patient Details Name: Jenny Oliver MRN: [id number] DOB: 10-Jan-1946 Today's Date: 07/30/2022  History of Present Illness  Pt is 77 yo female admitted 07/29/22 with incarcerated ventral hernia and is s/p ex lap primary repair on 07/29/22.  Pt with other hx including but not limited to BPPV, COPD, DDD, IBS, meningioma, sciatica  Clinical Impression  Pt admitted with above diagnosis.  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  She reports that she has to provide physical assist to her mother and does not really have anyone else to assist.  Reports has private aide helping while she is hospitalized, but not sure what the plan will be when she returns home. Today, pt reports pain much better than pre op.  She demonstrated log roll technique for bed mobility without assist and performed with ease considering POD #1.  She was able to perform toielting ADLS, sit to stands, and ambulated 200' with supervision and no AD safely.  Pt is not at baseline due to recent surgery, but will not require skilled services to advance.   Did stress during evaluation that she will need someone to assist with her mother as she should not be pulling or lifting her; also, may need some assist with IADLs (meals, etc) at d/c.      Recommendations for follow up therapy are one component of a multi-disciplinary discharge planning process, led by the attending physician.  Recommendations may be updated based on patient status, additional functional criteria and insurance authorization.  Follow Up Recommendations       Assistance Recommended at Discharge PRN (Pt herself will only need PRN assist for IADLs; however, she will need someone else to assist her mother)  Patient can return home with the following  Assistance with cooking/housework;Help with stairs or ramp for entrance    Equipment Recommendations None recommended by PT  Recommendations for Other Services       Functional  Status Assessment Patient has not had a recent decline in their functional status     Precautions / Restrictions Precautions Precautions: Fall      Mobility  Bed Mobility Overal bed mobility: Needs Assistance Bed Mobility: Rolling, Sidelying to Sit, Sit to Sidelying Rolling: Supervision Sidelying to sit: Supervision     Sit to sidelying: Supervision General bed mobility comments: Pt already performing log roll technique and no assist needed; Pt also able to adjust self in bed.    Transfers Overall transfer level: Needs assistance Equipment used: None Transfers: Sit to/from Stand Sit to Stand: Supervision           General transfer comment: Performed from bed and toilet x 2 without assist; pt does hold surgical site for pain control    Ambulation/Gait Ambulation/Gait assistance: Supervision Gait Distance (Feet): 200 Feet Assistive device: None Gait Pattern/deviations: Step-through pattern       General Gait Details: Steady gait at decreased speed and slight trunk flexion for pain control  Stairs            Wheelchair Mobility    Modified Rankin (Stroke Patients Only)       Balance Overall balance assessment: Needs assistance Sitting-balance support: No upper extremity supported Sitting balance-Leahy Scale: Good     Standing balance support: No upper extremity supported Standing balance-Leahy Scale: Good                               Pertinent Vitals/Pain  Pain Assessment Pain Assessment: 0-10 Pain Score: 3  Pain Location: stomach Pain Descriptors / Indicators: Discomfort Pain Intervention(s): Limited activity within patient's tolerance, Monitored during session    Home Living Family/patient expects to be discharged to:: Private residence Living Arrangements: Parent (pt is her caregiver) Available Help at Discharge: Other (Comment) (lives with mother but no one to assist) Type of Home: House Home Access: Stairs to  enter Entrance Stairs-Rails: Left Entrance Stairs-Number of Steps: 2 small steps   Home Layout: One level Home Equipment: Shower seat Additional Comments: Mother has walker and w/c    Prior Function Prior Level of Function : Independent/Modified Independent;Driving;Working/employed             Mobility Comments: 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; Pt does have to physically assist her mother       Hand Dominance        Extremity/Trunk Assessment   Upper Extremity Assessment Upper Extremity Assessment: Overall WFL for tasks assessed    Lower Extremity Assessment Lower Extremity Assessment: Overall WFL for tasks assessed (ROM WFL; MMT at least 3/5 throughout but not further tested due to acuity of hernia repair)    Cervical / Trunk Assessment Cervical / Trunk Assessment: Other exceptions Cervical / Trunk Exceptions: Does lean forward to not stretch abdmonial surgical site  Communication      Cognition Arousal/Alertness: Awake/alert Behavior During Therapy: WFL for tasks assessed/performed Overall Cognitive Status: Within Functional Limits for tasks assessed                                          General Comments General comments (skin integrity, edema, etc.): Educated on log roll techniques; sleeping positions of comfort (pillow under knees on back, head elevated, recliner, if on side pillow between knees); holding surgical site or hugging pillow as needed for transfers, coughs, etc. Educated pt twice during session that she has lifting restrictions and does NOT need to be pulling/assisting her mother at d/c and that she would need help if lifting heavier items like laundry detergent or heavy pots/pans    Exercises     Assessment/Plan    PT Assessment Patient does not need any further PT services  PT Problem List         PT Treatment Interventions      PT Goals (Current goals can be found in the Care Plan section)  Acute Rehab PT  Goals Patient Stated Goal: return home PT Goal Formulation: All assessment and education complete, DC therapy    Frequency       Co-evaluation               AM-PAC PT "6 Clicks" Mobility  Outcome Measure Help needed turning from your back to your side while in a flat bed without using bedrails?: None Help needed moving from lying on your back to sitting on the side of a flat bed without using bedrails?: A Little Help needed moving to and from a bed to a chair (including a wheelchair)?: A Little Help needed standing up from a chair using your arms (e.g., wheelchair or bedside chair)?: A Little Help needed to walk in hospital room?: A Little Help needed climbing 3-5 steps with a railing? : A Little 6 Click Score: 19    End of Session   Activity Tolerance: Patient tolerated treatment well Patient left: in bed;with call bell/phone within  reach;with bed alarm set Nurse Communication: Mobility status PT Visit Diagnosis: Other abnormalities of gait and mobility (R26.89)    Time: 4098-1191 PT Time Calculation (min) (ACUTE ONLY): 37 min   Charges:   PT Evaluation $PT Eval Low Complexity: 1 Low PT Treatments $Therapeutic Activity: 8-22 mins        Anise Salvo, PT Acute Rehab [facility] Rehab [phone]   Rayetta Humphrey 07/30/2022, 4:32 PM

## 2022-07-30 NOTE — TOC CM/SW Note (Signed)
  Transition of Care [facility]) Screening Note   Patient Details  Name: Jenny Oliver Date of Birth: 1945-06-27   Transition of Care [facility]) CM/SW Contact:    Amada Jupiter, LCSW Phone Number: 07/30/2022, 1:55 PM    Transition of Care Department [facility]) has reviewed patient and no TOC needs have been identified at this time. We will continue to monitor patient advancement through interdisciplinary progression rounds. If new patient transition needs arise, please place a TOC consult.

## 2022-07-30 NOTE — Transfer of Care (Signed)
Immediate Anesthesia Transfer of Care Note  Patient: KAYSEE HERGERT  Procedure(s) Performed: Procedure(s): HERNIA REPAIR VENTRAL ADULT (Left) EXPLORATORY LAPAROTOMY (N/A)  Patient Location: PACU  Anesthesia Type:General  Level of Consciousness: Patient easily awoken, sedated, comfortable, cooperative, following commands, responds to stimulation.   Airway & Oxygen Therapy: Patient spontaneously breathing, ventilating well, oxygen via simple oxygen mask.  Post-op Assessment: Report given to PACU RN, vital signs reviewed and stable, moving all extremities.   Post vital signs: Reviewed and stable.  Complications: No apparent anesthesia complications  Last Vitals:  Vitals Value Taken Time  BP 164/85 07/29/22 2345  Temp 36.4 C 07/29/22 2345  Pulse 80 07/29/22 2345  Resp 17 07/29/22 2345  SpO2 100 % 07/29/22 2345    Last Pain:  Vitals:   07/29/22 1728  PainSc: 6          Complications: No notable events documented.

## 2022-07-30 NOTE — Plan of Care (Signed)
°  Problem: Activity: °Goal: Risk for activity intolerance will decrease °Outcome: Progressing °  °Problem: Elimination: °Goal: Will not experience complications related to urinary retention °Outcome: Progressing °  °Problem: Pain Managment: °Goal: General experience of comfort will improve °Outcome: Progressing °  °

## 2022-07-30 NOTE — Anesthesia Postprocedure Evaluation (Signed)
Anesthesia Post Note  Patient: Jenny Oliver  Procedure(s) Performed: HERNIA REPAIR VENTRAL ADULT (Left: Abdomen) EXPLORATORY LAPAROTOMY (Abdomen)     Patient location during evaluation: PACU Anesthesia Type: General Level of consciousness: awake and alert Pain management: pain level controlled Vital Signs Assessment: post-procedure vital signs reviewed and stable Respiratory status: spontaneous breathing, nonlabored ventilation, respiratory function stable and patient connected to nasal cannula oxygen Cardiovascular status: blood pressure returned to baseline and stable Postop Assessment: no apparent nausea or vomiting Anesthetic complications: no   No notable events documented.  Last Vitals:  Vitals:   07/30/22 1315 07/30/22 1757  BP: 109/65 124/78  Pulse: 85 82  Resp: 20 16  Temp: 37.2 C 36.8 C  SpO2: 91% 94%    Last Pain:  Vitals:   07/30/22 1757  TempSrc: Oral  PainSc:                  Collene Schlichter

## 2022-07-30 NOTE — Progress Notes (Addendum)
Progress Note  1 Day Post-Op  Subjective: Pain is significantly improved from yesterday and now mostly sore at incision site with pain improved by ice. Not passing flatus. No BM. No n/v  Mother is at bedside  Objective: Vital signs in last 24 hours: Temp:  [97.6 F (36.4 C)-98.5 F (36.9 C)] 98.2 F (36.8 C) (05/03 0931) Pulse Rate:  [65-114] 75 (05/03 0931) Resp:  [13-25] 18 (05/03 0931) BP: (107-164)/(61-87) 116/67 (05/03 0931) SpO2:  [91 %-100 %] 92 % (05/03 0931) Weight:  [47.2 kg] 47.2 kg (05/02 1720) Last BM Date : 07/27/22  Intake/Output from previous day: 05/02 0701 - 05/03 0700 In: 358.2 [I.V.:358.2] Out: 430 [Urine:380; Blood:50] Intake/Output this shift: Total I/O In: 226.6 [I.V.:226.6] Out: -   PE: General: pleasant, WD, female who is laying in bed in NAD Lungs: Respiratory effort nonlabored on room air Abd: soft, mild distension. TTP over LLQ incision which is cdi MSK: all 4 extremities are symmetrical with no cyanosis, clubbing, or edema. Skin: warm and dry Psych: A&Ox3 with an appropriate affect.    Lab Results:  Recent Labs    07/29/22 1736 07/30/22 0334  WBC 5.4 5.9  HGB 14.7 11.5*  HCT 44.6 35.8*  PLT 338 265   BMET Recent Labs    07/29/22 1736 07/30/22 0334  NA 136 136  K 4.3 3.9  CL 102 106  CO2 23 21*  GLUCOSE 96 96  BUN 32* 24*  CREATININE 1.13* 0.77  CALCIUM 9.7 8.3*   PT/INR No results for input(s): "LABPROT", "INR" in the last 72 hours. CMP     Component Value Date/Time   NA 136 07/30/2022 0334   NA 143 04/29/2020 1533   K 3.9 07/30/2022 0334   CL 106 07/30/2022 0334   CO2 21 (L) 07/30/2022 0334   GLUCOSE 96 07/30/2022 0334   BUN 24 (H) 07/30/2022 0334   BUN 9 04/29/2020 1533   CREATININE 0.77 07/30/2022 0334   CREATININE 0.86 09/06/2013 1158   CALCIUM 8.3 (L) 07/30/2022 0334   PROT 7.2 07/29/2022 1736   PROT 6.7 09/02/2020 1455   ALBUMIN 4.2 07/29/2022 1736   ALBUMIN 4.2 09/02/2020 1455   AST 15  07/29/2022 1736   ALT 12 07/29/2022 1736   ALKPHOS 71 07/29/2022 1736   BILITOT 1.5 (H) 07/29/2022 1736   BILITOT 0.6 09/02/2020 1455   GFRNONAA >60 07/30/2022 0334   GFRAA 99 04/29/2020 1533   Lipase     Component Value Date/Time   LIPASE 27 07/29/2022 1736       Studies/Results: CT ABDOMEN PELVIS W CONTRAST  Result Date: 07/29/2022 CLINICAL DATA:  Low abdominal pain with nausea vomiting EXAM: CT ABDOMEN AND PELVIS WITH CONTRAST TECHNIQUE: Multidetector CT imaging of the abdomen and pelvis was performed using the standard protocol following bolus administration of intravenous contrast. RADIATION DOSE REDUCTION: This exam was performed according to the departmental dose-optimization program which includes automated exposure control, adjustment of the mA and/or kV according to patient size and/or use of iterative reconstruction technique. CONTRAST:  <See Chart> OMNIPAQUE IOHEXOL 350 MG/ML SOLN, 80mL OMNIPAQUE IOHEXOL 300 MG/ML SOLN COMPARISON:  CT 04/03/2019 FINDINGS: Lower chest: Lung bases demonstrate no acute airspace disease. Moderate hiatal hernia. Hepatobiliary: No focal liver abnormality is seen. No gallstones, gallbladder wall thickening, or biliary dilatation. Pancreas: Unremarkable. No pancreatic ductal dilatation or surrounding inflammatory changes. Spleen: Normal in size without focal abnormality. Adrenals/Urinary Tract: Adrenal glands are unremarkable. Kidneys are normal, without renal calculi, focal lesion, or hydronephrosis.  Bladder is unremarkable. Stomach/Bowel: Stomach nonenlarged. Multiple dilated fluid-filled loops of proximal to mid small bowel measuring up to 4 cm. Transition point related to a left lower quadrant ventral (spigelian) hernia. Short segment of herniated small bowel demonstrates wall thickening. There is fluid in the hernia sac. The bowel distal to the hernia is decompressed. Vascular/Lymphatic: Moderate aortic atherosclerosis. No aneurysm. No suspicious lymph  nodes Reproductive: Prostate is unremarkable. Other: Small free fluid. No free air. Small fat containing umbilical and periumbilical hernia. Musculoskeletal: No acute osseous abnormality. Grade 1 anterolisthesis L4 on L5 and L5 on S1. Degenerative changes IMPRESSION: 1. Findings consistent with small-bowel obstruction with transition point related to end incarcerated left lower quadrant ventral (spigelian) hernia. No perforation. No intramural air 2. Small free fluid in the abdomen and pelvis. Moderate hiatal hernia. 3. Aortic atherosclerosis. Aortic Atherosclerosis (ICD10-I70.0). Electronically Signed   By: Jasmine Pang M.D.   On: 07/29/2022 19:30    Anti-infectives: Anti-infectives (From admission, onward)    Start     Dose/Rate Route Frequency Ordered Stop   07/29/22 2156  ceFAZolin (ANCEF) 2-4 GM/100ML-% IVPB       Note to Pharmacy: [name]: cabinet override      07/29/22 2156 07/30/22 0713        Assessment/Plan INCARCERATED VENTRAL HERNIA   POD1 s/p ex lap primary repair ventral hernia Dr. Carolynne Edouard 5/2  - start CLD and AROBF - good pain control - encouraged ambulation. IS ordered. PT ordered  FEN: CLD, IVF @ 75 ml/hr ID: ancef periop VTE: lovenox  Tobacco use - declines nicotine patch currently COPD DDD GERD H/o h pyolori infection IBS   LOS: 1 day   Eric Form, [facility] 07/30/2022, 10:20 AM Please see Amion for pager number during day hours 7:00am-4:30pm

## 2022-07-31 NOTE — Progress Notes (Signed)
2 Days Post-Op   Subjective/Chief Complaint: Small amt flatus, sore, oob   Objective: Vital signs in last 24 hours: Temp:  [98.2 F (36.8 C)-98.9 F (37.2 C)] 98.6 F (37 C) (05/04 0557) Pulse Rate:  [74-85] 78 (05/04 0557) Resp:  [16-20] 20 (05/04 0557) BP: (109-138)/(64-85) 138/77 (05/04 0557) SpO2:  [91 %-96 %] 94 % (05/04 0557) Last BM Date : 07/27/22  Intake/Output from previous day: 05/03 0701 - 05/04 0700 In: 1939 [P.O.:520; I.V.:1419] Out: 820 [Urine:820] Intake/Output this shift: No intake/output data recorded.  General nad Pulm effort normal Cv regular Ab mild distended tender at incision, incisino without infection  Lab Results:  Recent Labs    07/29/22 1736 07/30/22 0334  WBC 5.4 5.9  HGB 14.7 11.5*  HCT 44.6 35.8*  PLT 338 265   BMET Recent Labs    07/29/22 1736 07/30/22 0334  NA 136 136  K 4.3 3.9  CL 102 106  CO2 23 21*  GLUCOSE 96 96  BUN 32* 24*  CREATININE 1.13* 0.77  CALCIUM 9.7 8.3*   PT/INR No results for input(s): "LABPROT", "INR" in the last 72 hours. ABG No results for input(s): "PHART", "HCO3" in the last 72 hours.  Invalid input(s): "PCO2", "PO2"  Studies/Results: CT ABDOMEN PELVIS W CONTRAST  Result Date: 07/29/2022 CLINICAL DATA:  Low abdominal pain with nausea vomiting EXAM: CT ABDOMEN AND PELVIS WITH CONTRAST TECHNIQUE: Multidetector CT imaging of the abdomen and pelvis was performed using the standard protocol following bolus administration of intravenous contrast. RADIATION DOSE REDUCTION: This exam was performed according to the departmental dose-optimization program which includes automated exposure control, adjustment of the mA and/or kV according to patient size and/or use of iterative reconstruction technique. CONTRAST:  <See Chart> OMNIPAQUE IOHEXOL 350 MG/ML SOLN, 80mL OMNIPAQUE IOHEXOL 300 MG/ML SOLN COMPARISON:  CT 04/03/2019 FINDINGS: Lower chest: Lung bases demonstrate no acute airspace disease. Moderate hiatal  hernia. Hepatobiliary: No focal liver abnormality is seen. No gallstones, gallbladder wall thickening, or biliary dilatation. Pancreas: Unremarkable. No pancreatic ductal dilatation or surrounding inflammatory changes. Spleen: Normal in size without focal abnormality. Adrenals/Urinary Tract: Adrenal glands are unremarkable. Kidneys are normal, without renal calculi, focal lesion, or hydronephrosis. Bladder is unremarkable. Stomach/Bowel: Stomach nonenlarged. Multiple dilated fluid-filled loops of proximal to mid small bowel measuring up to 4 cm. Transition point related to a left lower quadrant ventral (spigelian) hernia. Short segment of herniated small bowel demonstrates wall thickening. There is fluid in the hernia sac. The bowel distal to the hernia is decompressed. Vascular/Lymphatic: Moderate aortic atherosclerosis. No aneurysm. No suspicious lymph nodes Reproductive: Prostate is unremarkable. Other: Small free fluid. No free air. Small fat containing umbilical and periumbilical hernia. Musculoskeletal: No acute osseous abnormality. Grade 1 anterolisthesis L4 on L5 and L5 on S1. Degenerative changes IMPRESSION: 1. Findings consistent with small-bowel obstruction with transition point related to end incarcerated left lower quadrant ventral (spigelian) hernia. No perforation. No intramural air 2. Small free fluid in the abdomen and pelvis. Moderate hiatal hernia. 3. Aortic atherosclerosis. Aortic Atherosclerosis (ICD10-I70.0). Electronically Signed   By: Jenny Oliver M.D.   On: 07/29/2022 19:30    Anti-infectives: Anti-infectives (From admission, onward)    Start     Dose/Rate Route Frequency Ordered Stop   07/29/22 2156  ceFAZolin (ANCEF) 2-4 GM/100ML-% IVPB       Note to Pharmacy: Jenny Oliver C: cabinet override      07/29/22 2156 07/30/22 0713       Assessment/Plan: INCARCERATED VENTRAL HERNIA  POD 2 s/p ex lap primary repair ventral hernia Dr. Carolynne Edouard 5/2   - leave on clears today, some  nausea and not much bowel function yet - good pain control - encouraged ambulation. Pulm toilet   FEN: CLD, IVF, will recheck labs in am ID: ancef periop VTE: lovenox   Tobacco use - declines nicotine patch currently COPD DDD GERD H/o h pylori infection IBS Jenny Oliver 07/31/2022

## 2022-08-01 LAB — BASIC METABOLIC PANEL
Anion gap: 8 (ref 5–15)
BUN: 9 mg/dL (ref 8–23)
CO2: 22 mmol/L (ref 22–32)
Calcium: 8.3 mg/dL — ABNORMAL LOW (ref 8.9–10.3)
Chloride: 107 mmol/L (ref 98–111)
Creatinine, Ser: 0.55 mg/dL (ref 0.44–1.00)
GFR, Estimated: >60 mL/min (ref >60)
Glucose, Bld: 65 mg/dL — ABNORMAL LOW (ref 70–99)
Potassium: 3.4 mmol/L — ABNORMAL LOW (ref 3.5–5.1)
Sodium: 137 mmol/L (ref 135–145)

## 2022-08-01 LAB — CBC
HCT: 35.7 % — ABNORMAL LOW (ref 36.0–46.0)
Hemoglobin: 12.2 g/dL (ref 12.0–15.0)
MCH: 33.6 pg (ref 26.0–34.0)
MCHC: 34.2 g/dL (ref 30.0–36.0)
MCV: 98.3 fL (ref 80.0–100.0)
Platelets: 293 10*3/uL (ref 150–400)
RBC: 3.63 MIL/uL — ABNORMAL LOW (ref 3.87–5.11)
RDW: 12.3 % (ref 11.5–15.5)
WBC: 3.7 10*3/uL — ABNORMAL LOW (ref 4.0–10.5)
nRBC: 0 % (ref 0.0–0.2)

## 2022-08-01 MED ORDER — ORAL CARE MOUTH RINSE: 15.0000 mL | OROMUCOSAL | Status: DC | PRN

## 2022-08-01 NOTE — Progress Notes (Signed)
Mobility Specialist - Progress Note   08/01/22 1440  Mobility  Activity Ambulated with assistance in hallway  Level of Assistance Modified independent, requires aide device or extra time  Assistive Device Other (Comment)  Distance Ambulated (ft) 180 ft  Activity Response Tolerated well  Mobility Referral Yes  $Mobility charge 1 Mobility   Pt received in recliner and agreeable to mobility. No complaints during session. Pt to recliner after session with all needs met.    [facility]

## 2022-08-01 NOTE — Progress Notes (Signed)
Mobility Specialist - Progress Note   08/01/22 1022  Mobility  Activity Ambulated with assistance in hallway  Level of Assistance Modified independent, requires aide device or extra time  Assistive Device Other (Comment) (IV Pole)  Distance Ambulated (ft) 160 ft  Activity Response Tolerated well  Mobility Referral Yes  $Mobility charge 1 Mobility   Pt received in recliner and agreeable to mobility. When returning to room pt c/o stomach soreness. No other complaints during session. Pt to recliner after session with all needs met.  [facility]

## 2022-08-01 NOTE — Plan of Care (Signed)
°  Problem: Clinical Measurements: °Goal: Ability to maintain clinical measurements within normal limits will improve °Outcome: Progressing °  °Problem: Activity: °Goal: Risk for activity intolerance will decrease °Outcome: Progressing °  °Problem: Nutrition: °Goal: Adequate nutrition will be maintained °Outcome: Progressing °  °

## 2022-08-01 NOTE — Progress Notes (Signed)
3 Days Post-Op   Subjective/Chief Complaint: Soreness improved No nausea or vomiting Small amount of flatus Still on clears   Objective: Vital signs in last 24 hours: Temp:  [98.2 F (36.8 C)-98.5 F (36.9 C)] 98.2 F (36.8 C) (05/05 0606) Pulse Rate:  [64-71] 64 (05/05 0606) Resp:  [15-18] 18 (05/05 0606) BP: (148-163)/(74-90) 159/74 (05/05 0606) SpO2:  [95 %-96 %] 95 % (05/05 0606) Last BM Date : 07/27/22  Intake/Output from previous day: 05/04 0701 - 05/05 0700 In: 2486.2 [P.O.:780; I.V.:1706.2] Out: 2250 [Urine:2250] Intake/Output this shift: No intake/output data recorded.  General appearance: alert, cooperative, and no distress GI: soft, mild incisional tenderness LLQ incision c/d/i  Lab Results:  Recent Labs    07/30/22 0334 08/01/22 0335  WBC 5.9 3.7*  HGB 11.5* 12.2  HCT 35.8* 35.7*  PLT 265 293   BMET Recent Labs    07/30/22 0334 08/01/22 0335  NA 136 137  K 3.9 3.4*  CL 106 107  CO2 21* 22  GLUCOSE 96 65*  BUN 24* 9  CREATININE 0.77 0.55  CALCIUM 8.3* 8.3*   PT/INR No results for input(s): "LABPROT", "INR" in the last 72 hours. ABG No results for input(s): "PHART", "HCO3" in the last 72 hours.  Invalid input(s): "PCO2", "PO2"  Studies/Results: No results found.  Anti-infectives: Anti-infectives (From admission, onward)    Start     Dose/Rate Route Frequency Ordered Stop   07/29/22 2156  ceFAZolin (ANCEF) 2-4 GM/100ML-% IVPB       Note to Pharmacy: [name]: cabinet override      07/29/22 2156 07/30/22 0713       Assessment/Plan: INCARCERATED VENTRAL HERNIA   POD 3 s/p ex lap primary repair ventral hernia Dr. Carolynne Edouard 5/2   - advance diet to soft diet - good pain control - encouraged ambulation. Pulm toilet   FEN: soft, IVF,  ID: ancef periop VTE: lovenox   Tobacco use - declines nicotine patch currently COPD DDD GERD H/o h pylori infection IBS  May be ready for discharge 08/02/22  LOS: 3 days    Wynona Luna 08/01/2022

## 2022-08-02 MED ORDER — ACETAMINOPHEN 500 MG PO TABS: 1000.0000 mg | ORAL_TABLET | Freq: Four times a day (QID) | ORAL | 0 refills | Status: AC | PRN

## 2022-08-02 MED ORDER — TRAMADOL HCL 50 MG PO TABS: 50.0000 mg | ORAL_TABLET | Freq: Four times a day (QID) | ORAL | 0 refills | Status: DC | PRN

## 2022-08-02 NOTE — Progress Notes (Signed)
Discharge package printed and instructions given to pt. Patient verbalizes understanding. 

## 2022-08-02 NOTE — Discharge Summary (Addendum)
Patient ID: Jenny Oliver [id number] 1945-04-15 77 y.o.  Admit date: 07/29/2022 Discharge date: 08/02/2022  Admitting Diagnosis: SBO secondary to incarcerated LLQ abdominal wall hernia  Discharge Diagnosis Patient Active Problem List   Diagnosis Date Noted   SBO (small bowel obstruction) (HCC) 07/29/2022   Encounter for general adult medical examination with abnormal findings 11/13/2020   Leukopenia 11/13/2020   Myalgia due to statin 07/10/2020   Hyperlipidemia 07/10/2020   Atherosclerotic heart disease of native coronary artery without angina pectoris 07/10/2020   Pulmonary emphysema (HCC) 07/10/2020   Chronic kidney disease, stage 2 (mild) 07/10/2020   Cigarette smoker 07/10/2020   Edema of lower extremity 07/10/2020   Essential hypertension 07/10/2020   Hiatal hernia 07/10/2020   History of gastrointestinal disease 07/10/2020   Hypercalcemia 07/10/2020   Hypertension secondary to endocrine disorders 07/10/2020   Nephrolithiasis 07/10/2020   Noncompliance with treatment 07/10/2020   Osteopenia 07/10/2020   Osteoporosis with current pathological fracture 07/10/2020   Prediabetes 07/10/2020   Pure hypercholesterolemia 07/10/2020   Renovascular hypertension 07/10/2020   Vitamin B12 deficiency (non anemic) 07/10/2020   Venous stasis of both lower extremities 04/11/2019   Bilateral hand pain 10/04/2018   Mass of finger of right hand 10/04/2018   Nail deformity 10/04/2018   Headache 08/27/2015   Benign paroxysmal positional vertigo 04/24/2015   Chronic insomnia 04/24/2015   Gait disorder 11/06/2014   Tobacco user 09/08/2013   Allergic rhinitis 09/08/2013   Lipid disorder 09/08/2013   Gastroesophageal reflux disease 09/08/2013   Caregiver stress 09/08/2013    Consultants none  Reason for Admission: The patient is a 77 year old black female who has been having abdominal pain for the last 2 weeks. She finally came to the emergency department tonight where a CT scan  showed what looks like an incarcerated left lower quadrant ventral hernia with bowel obstruction. She denies any significant nausea or vomiting. She has not had a bowel movement in 2 days. She has not passed flatus today. She is still smoking.   Procedures Ex lap with repair of ventral hernia, Dr. Carolynne Edouard 07/29/22  Hospital Course:  The patient was admitted and underwent the above procedure.  She tolerated this well.  She was started on a CLD post op until she had better bowel function.  As this returned her diet was able to be advanced as tolerated.  She had a large BM on day of discharge and had minimal pain.  She took Tylenol for this.  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  She drove herself to the hospital.  She requests to drive herself home.  Given she has minimal pain, mobilizing, and is not taking narcotic pain meds, she has been given permission to drive herself home.  She otherwise remained medically stable during her admission and was safe for DC home on POD 4.  Physical Exam: Abd: soft, minimally tender, +BS, ND, incision is c/d/i  Allergies as of 08/02/2022       Reactions   Alpha Blocker Quinazolines Other (See Comments)   unknown   Clindamycin Other (See Comments)   unknown   Fluticasone Dermatitis, Other (See Comments)   Guaifenesin Er Other (See Comments)   unknown   [place] Bread [wolfiporia [place]] Other (See Comments)   Unknown   Lipitor [atorvastatin] Nausea And Vomiting   Loratadine Other (See Comments)   unknown   Milk-related Compounds Other (See Comments)   Unknown   Other  Per pt avoids Bread, peanuts/tree nuts due to bloating  opioids causes nausea and vomiting Lactose intolerance   Peanut-containing Drug Products Other (See Comments)   Unknown   Rosuvastatin Nausea Only, Other (See Comments)   Zetia [ezetimibe]    Unknown        Medication List     STOP taking these medications    acetaminophen 650 MG CR  tablet Commonly known as: TYLENOL Replaced by: acetaminophen 500 MG tablet   Repatha SureClick 140 MG/ML Soaj Generic drug: Evolocumab       TAKE these medications    acetaminophen 500 MG tablet Commonly known as: TYLENOL Take 2 tablets (1,000 mg total) by mouth every 6 (six) hours as needed. Replaces: acetaminophen 650 MG CR tablet   albuterol 108 (90 Base) MCG/ACT inhaler Commonly known as: VENTOLIN HFA Inhale 1 puff into the lungs every 6 (six) hours as needed for shortness of breath.   aMILoride 5 MG tablet Commonly known as: MIDAMOR Take 5 mg by mouth daily.   aspirin EC 81 MG tablet Take 1 tablet (81 mg total) by mouth daily. Swallow whole.   Co Q 10 10 MG Caps Take 1 capsule by mouth daily.   GLUCOSAMINE-CHONDROITIN DS PO Take 1 tablet by mouth in the morning and at bedtime.   mirtazapine 15 MG tablet Commonly known as: REMERON Take 15 mg by mouth at bedtime.   Spiriva Respimat 2.5 MCG/ACT Aers Generic drug: Tiotropium Bromide Monohydrate Inhale 2 puffs into the lungs daily.   traMADol 50 MG tablet Commonly known as: Ultram Take 1 tablet (50 mg total) by mouth every 6 (six) hours as needed.          Follow-up Information     Chevis Pretty III, MD Follow up on 08/26/2022.   Specialty: General Surgery Why: 10:40am, Arrive 30 minutes prior to your appointment time, Please bring your insurance card and photo ID Contact information: [address] [place] [address] [place] [address] [phone]                 Signed: Barnetta Chapel, [facility] 08/02/2022, 9:59 AM Please see Amion for pager number during day hours 7:00am-4:30pm, 7-11:30am on Weekends

## 2022-08-02 NOTE — Plan of Care (Signed)
  Problem: Activity: Goal: Risk for activity intolerance will decrease Outcome: Progressing   Problem: Nutrition: Goal: Adequate nutrition will be maintained Outcome: Progressing   

## 2022-08-02 NOTE — Discharge Instructions (Signed)
[facility] [phone]  OPEN ABDOMINAL SURGERY: POST OP INSTRUCTIONS  Always review your discharge instruction sheet given to you by the facility where your surgery was performed.  IF YOU HAVE DISABILITY OR FAMILY LEAVE FORMS, YOU MUST BRING THEM TO THE OFFICE FOR PROCESSING.  PLEASE DO NOT GIVE THEM TO YOUR DOCTOR.  A prescription for pain medication may be given to you upon discharge.  Take your pain medication as prescribed, if needed.  If narcotic pain medicine is not needed, then you may take acetaminophen (Tylenol) or ibuprofen (Advil) as needed. Take your usually prescribed medications unless otherwise directed. If you need a refill on your pain medication, please contact your pharmacy. They will contact our office to request authorization.  Prescriptions will not be filled after 5pm or on week-ends. You should follow a light diet the first few days after arrival home, such as soup and crackers, pudding, etc.unless your doctor has advised otherwise. A high-fiber, low fat diet can be resumed as tolerated.   Be sure to include lots of fluids daily. Most patients will experience some swelling and bruising on the chest and neck area.  Ice packs will help.  Swelling and bruising can take several days to resolve Most patients will experience some swelling and bruising in the area of the incision. Ice pack will help. Swelling and bruising can take several days to resolve..  It is common to experience some constipation if taking pain medication after surgery.  Increasing fluid intake and taking a stool softener will usually help or prevent this problem from occurring.  A mild laxative (Milk of Magnesia or Miralax) should be taken according to package directions if there are no bowel movements after 48 hours.  You may have steri-strips (small skin tapes) in place directly over the incision.  These strips should be left on the skin for 7-10 days.  If your surgeon used skin  glue on the incision, you may shower in 24 hours.  The glue will flake off over the next 2-3 weeks.  Any sutures or staples will be removed at the office during your follow-up visit. You may find that a light gauze bandage over your incision may keep your staples from being rubbed or pulled. You may shower and replace the bandage daily. ACTIVITIES:  You may resume regular (light) daily activities beginning the next day--such as daily self-care, walking, climbing stairs--gradually increasing activities as tolerated.  You may have sexual intercourse when it is comfortable.  Refrain from any heavy lifting or straining until approved by your doctor. You may drive when you no longer are taking prescription pain medication, you can comfortably wear a seatbelt, and you can safely maneuver your car and apply brakes Return to Work: ___________________________________ You should see your doctor in the office for a follow-up appointment approximately two weeks after your surgery.  Make sure that you call for this appointment within a day or two after you arrive home to insure a convenient appointment time. OTHER INSTRUCTIONS:  _____________________________________________________________ _____________________________________________________________  WHEN TO CALL YOUR DOCTOR: Fever over 101.0 Inability to urinate Nausea and/or vomiting Extreme swelling or bruising Continued bleeding from incision. Increased pain, redness, or drainage from the incision. Difficulty swallowing or breathing Muscle cramping or spasms. Numbness or tingling in hands or feet or around lips.  The clinic staff is available to answer your questions during regular business hours.  Please don't hesitate to call and ask to speak to one of   the nurses if you have concerns.  For further questions, please visit [email or web address]  

## 2022-08-05 ENCOUNTER — Ambulatory Visit
Admission: RE | Admit: 2022-08-05 | Discharge: 2022-08-05 | Disposition: A | Discharge status: Home / Self Care | Payer: Medicare Other | Source: Ambulatory Visit | Attending: Endocrinology | Admitting: Endocrinology

## 2022-08-05 DIAGNOSIS — E041 Nontoxic single thyroid nodule: Secondary | ICD-10-CM

## 2022-08-05 DIAGNOSIS — R911 Solitary pulmonary nodule: Secondary | ICD-10-CM | POA: Diagnosis not present

## 2022-08-24 NOTE — Progress Notes (Deleted)
Cardiology Office Note:   Date:  08/24/2022  ID:  Jenny Oliver, DOB Jan 03, 1946, MRN [id number]  History of Present Illness:   Jenny Oliver is a 77 y.o. female CAD with coronary calcification on CT chest, COPD, pulmonary HTN, IBS, and GERD who was previously followed by Dr. Katrinka Blazing who now returns to clinic for follow-up.  Patient followed by Dr. Katrinka Blazing for coronary CA in LM, LAD and Lcx noted on CT chest. She has been on ASA. Did not tolerate statins or zetia.  Today, ***  Past Medical History:  Diagnosis Date   Abnormality of gait    due to balance instability,  episodic   Benign paroxysmal positional vertigo    Bilateral lower extremity edema    Chronic insomnia 04/24/2015   COPD with emphysema (HCC)    last chest CT 01-12-2018   DDD (degenerative disc disease), lumbosacral    Full dentures    GERD (gastroesophageal reflux disease)    no meds   Headache    Hiatal hernia    History of esophageal dilatation    2013 balloon dilatation   History of Helicobacter pylori infection    Hypertensive chronic kidney disease with stage 1 through stage 4 chronic kidney disease, or unspecified chronic kidney disease    per pt pcp note in epic   IBS (irritable bowel syndrome)    IBS (irritable bowel syndrome)    Liddle's syndrome    hypertension treated with potassium sparing diuretic, amiloride   Meningioma (HCC)    left cavernous sinus, MRI 09-16-2015 in epic;  followed by neurologist, dr Anne Hahn   OA (osteoarthritis)    Right ureteral stone    Sciatica    Smokers' cough (HCC)    occasionally productive   Vitamin B12 deficiency    Vitamin D deficiency      ROS: ***  Studies Reviewed:    EKG:  ***  Cardiac Studies & Procedures     STRESS TESTS  MYOCARDIAL PERFUSION IMAGING 05/30/2020  Narrative  The left ventricular ejection fraction is normal (55-65%).  Nuclear stress EF: 65%.  There was no ST segment deviation noted during stress.  The study is normal.  This is a  low risk study.  Low risk stress nuclear study with normal perfusion and normal left ventricular regional and global systolic function.   ECHOCARDIOGRAM  ECHOCARDIOGRAM COMPLETE 05/30/2020  Narrative ECHOCARDIOGRAM REPORT    Patient Name:   Jenny Oliver Date of Exam: 05/30/2020 Medical Rec #:  [id number]     Height:       63.0 in Accession #:    [id number]    Weight:       125.0 lb Date of Birth:  [birth year]     BSA:          1.584 m Patient Age:    74 years      BP:           138/78 mmHg Patient Gender: F             HR:           76 bpm. Exam Location:  [address]  Procedure: 2D Echo, Cardiac Doppler and Color Doppler  Indications:    I50.9 Congestive heart failure  History:        Patient has no prior history of Echocardiogram examinations. Risk Factors:Current Smoker. COPD.  Sonographer:    Daphine Deutscher RDCS Referring Phys: [phone] Barry Dienes [facility]  IMPRESSIONS   1. Left ventricular  ejection fraction, by estimation, is 60 to 65%. The left ventricle has normal function. The left ventricle has no regional wall motion abnormalities. There is mild left ventricular hypertrophy. Left ventricular diastolic parameters were normal. 2. Right ventricular systolic function is normal. The right ventricular size is mildly enlarged. There is mildly elevated pulmonary artery systolic pressure. The estimated right ventricular systolic pressure is 35.7 mmHg. 3. The mitral valve is normal in structure. No evidence of mitral valve regurgitation. No evidence of mitral stenosis. 4. The aortic valve is tricuspid. Aortic valve regurgitation is not visualized. Mild aortic valve sclerosis is present, with no evidence of aortic valve stenosis. 5. The inferior vena cava is normal in size with greater than 50% respiratory variability, suggesting right atrial pressure of 3 mmHg.  FINDINGS Left Ventricle: Left ventricular ejection fraction, by estimation, is 60 to 65%. The left ventricle has normal  function. The left ventricle has no regional wall motion abnormalities. The left ventricular internal cavity size was normal in size. There is mild left ventricular hypertrophy. Left ventricular diastolic parameters were normal.  Right Ventricle: The right ventricular size is mildly enlarged. No increase in right ventricular wall thickness. Right ventricular systolic function is normal. There is mildly elevated pulmonary artery systolic pressure. The tricuspid regurgitant velocity is 2.86 m/s, and with an assumed right atrial pressure of 3 mmHg, the estimated right ventricular systolic pressure is 35.7 mmHg.  Left Atrium: Left atrial size was normal in size.  Right Atrium: Right atrial size was normal in size.  Pericardium: There is no evidence of pericardial effusion.  Mitral Valve: The mitral valve is normal in structure. No evidence of mitral valve regurgitation. No evidence of mitral valve stenosis.  Tricuspid Valve: The tricuspid valve is normal in structure. Tricuspid valve regurgitation is mild.  Aortic Valve: The aortic valve is tricuspid. Aortic valve regurgitation is not visualized. Mild aortic valve sclerosis is present, with no evidence of aortic valve stenosis.  Pulmonic Valve: The pulmonic valve was not well visualized. Pulmonic valve regurgitation is not visualized.  Aorta: The aortic root and ascending aorta are structurally normal, with no evidence of dilitation.  Venous: The inferior vena cava is normal in size with greater than 50% respiratory variability, suggesting right atrial pressure of 3 mmHg.  IAS/Shunts: No atrial level shunt detected by color flow Doppler.   LEFT VENTRICLE PLAX 2D LVIDd:         3.60 cm  Diastology LVIDs:         2.00 cm  LV e' medial:    7.83 cm/s LV PW:         1.10 cm  LV E/e' medial:  10.3 LV IVS:        1.10 cm  LV e' lateral:   11.60 cm/s LVOT diam:     2.10 cm  LV E/e' lateral: 7.0 LV SV:         90 LV SV Index:   57 LVOT Area:      3.46 cm   RIGHT VENTRICLE             IVC RV Basal diam:  4.30 cm     IVC diam: 1.50 cm RV S prime:     14.90 cm/s TAPSE (M-mode): 2.2 cm  LEFT ATRIUM             Index       RIGHT ATRIUM           Index LA diam:  3.60 cm 2.27 cm/m  RA Area:     12.10 cm LA Vol (A2C):   27.1 ml 17.11 ml/m RA Volume:   29.10 ml  18.38 ml/m LA Vol (A4C):   25.3 ml 15.98 ml/m LA Biplane Vol: 28.3 ml 17.87 ml/m AORTIC VALVE LVOT Vmax:   133.50 cm/s LVOT Vmean:  79.400 cm/s LVOT VTI:    0.260 m  AORTA Ao Root diam: 3.00 cm Ao Asc diam:  3.50 cm  MITRAL VALVE               TRICUSPID VALVE MV Area (PHT): 4.68 cm    TR Peak grad:   32.7 mmHg MV Decel Time: 162 msec    TR Vmax:        286.00 cm/s MV E velocity: 81.00 cm/s MV A velocity: 94.60 cm/s  SHUNTS MV E/A ratio:  0.86        Systemic VTI:  0.26 m Systemic Diam: 2.10 cm  Epifanio Lesches MD Electronically signed by Epifanio Lesches MD Signature Date/Time: 05/30/2020/1:27:27 PM    Final              Risk Assessment/Calculations:   {Does this patient have ATRIAL FIBRILLATION?:[phone]} No BP recorded.  {Refresh Note OR Click here to enter BP  :1}***        Physical Exam:   VS:  There were no vitals taken for this visit.   Wt Readings from Last 3 Encounters:  07/29/22 104 lb (47.2 kg)  07/16/22 108 lb 9.6 oz (49.3 kg)  05/07/22 113 lb (51.3 kg)     GEN: Well nourished, well developed in no acute distress NECK: No JVD; No carotid bruits CARDIAC: ***RRR, no murmurs, rubs, gallops RESPIRATORY:  Clear to auscultation without rales, wheezing or rhonchi  ABDOMEN: Soft, non-tender, non-distended EXTREMITIES:  No edema; No deformity   ASSESSMENT AND PLAN:   #CAD: -CT chest with LM, LAD and Lcx disease -Myoview 05/2020 normal -Had chronic DOE in the setting of COPD but no chest pain  -Continue ASA 81mg  daily -Did not tolerate statins or zetia -Was on praluent ***  #Pulmonary HTN: -Mild pulmonary HTN on  TTE in 05/2020 measuirng 35.45mmHg -Group III secondary to underlying COPD/tobacco use -Encouraged cessation  #COPD: #Tobacco Use: -Follows with Pulm    {Are you ordering a CV Procedure (e.g. stress test, cath, DCCV, TEE, etc)?   Press F2        :[id number]}   Signed, Meriam Sprague, MD

## 2022-08-27 ENCOUNTER — Ambulatory Visit: Payer: Medicare Other | Attending: Cardiology | Admitting: Cardiology

## 2022-08-27 ENCOUNTER — Encounter: Payer: Self-pay | Admitting: Cardiology

## 2022-09-01 DIAGNOSIS — K450 Other specified abdominal hernia with obstruction, without gangrene: Secondary | ICD-10-CM | POA: Diagnosis not present

## 2022-09-01 DIAGNOSIS — K460 Unspecified abdominal hernia with obstruction, without gangrene: Secondary | ICD-10-CM | POA: Insufficient documentation

## 2022-09-07 DIAGNOSIS — R109 Unspecified abdominal pain: Secondary | ICD-10-CM | POA: Diagnosis not present

## 2022-09-07 DIAGNOSIS — K436 Other and unspecified ventral hernia with obstruction, without gangrene: Secondary | ICD-10-CM | POA: Diagnosis not present

## 2022-09-07 DIAGNOSIS — R634 Abnormal weight loss: Secondary | ICD-10-CM | POA: Diagnosis not present

## 2022-09-07 DIAGNOSIS — Z86010 Personal history of colonic polyps: Secondary | ICD-10-CM | POA: Diagnosis not present

## 2022-09-07 DIAGNOSIS — K259 Gastric ulcer, unspecified as acute or chronic, without hemorrhage or perforation: Secondary | ICD-10-CM | POA: Diagnosis not present

## 2022-09-14 DIAGNOSIS — R600 Localized edema: Secondary | ICD-10-CM | POA: Diagnosis not present

## 2022-09-14 DIAGNOSIS — L03116 Cellulitis of left lower limb: Secondary | ICD-10-CM | POA: Diagnosis not present

## 2022-09-15 DIAGNOSIS — R634 Abnormal weight loss: Secondary | ICD-10-CM | POA: Diagnosis not present

## 2022-09-15 DIAGNOSIS — R7303 Prediabetes: Secondary | ICD-10-CM | POA: Diagnosis not present

## 2022-09-15 DIAGNOSIS — E785 Hyperlipidemia, unspecified: Secondary | ICD-10-CM | POA: Diagnosis not present

## 2022-09-15 DIAGNOSIS — I152 Hypertension secondary to endocrine disorders: Secondary | ICD-10-CM | POA: Diagnosis not present

## 2022-09-21 DIAGNOSIS — I87302 Chronic venous hypertension (idiopathic) without complications of left lower extremity: Secondary | ICD-10-CM | POA: Diagnosis not present

## 2022-09-22 ENCOUNTER — Other Ambulatory Visit (HOSPITAL_COMMUNITY): Payer: Self-pay | Admitting: Internal Medicine

## 2022-09-22 ENCOUNTER — Ambulatory Visit (HOSPITAL_COMMUNITY)
Admission: RE | Admit: 2022-09-22 | Discharge: 2022-09-22 | Disposition: A | Discharge status: Home / Self Care | Payer: Medicare Other | Source: Ambulatory Visit | Attending: Internal Medicine | Admitting: Internal Medicine

## 2022-09-22 DIAGNOSIS — R600 Localized edema: Secondary | ICD-10-CM | POA: Diagnosis not present

## 2022-09-28 DIAGNOSIS — I878 Other specified disorders of veins: Secondary | ICD-10-CM | POA: Diagnosis not present

## 2022-10-06 DIAGNOSIS — E785 Hyperlipidemia, unspecified: Secondary | ICD-10-CM | POA: Diagnosis not present

## 2022-10-06 DIAGNOSIS — R7303 Prediabetes: Secondary | ICD-10-CM | POA: Diagnosis not present

## 2022-10-10 DIAGNOSIS — T783XXA Angioneurotic edema, initial encounter: Secondary | ICD-10-CM | POA: Diagnosis not present

## 2022-10-12 DIAGNOSIS — Z23 Encounter for immunization: Secondary | ICD-10-CM | POA: Diagnosis not present

## 2022-10-12 DIAGNOSIS — Z0000 Encounter for general adult medical examination without abnormal findings: Secondary | ICD-10-CM | POA: Diagnosis not present

## 2022-10-12 DIAGNOSIS — I87302 Chronic venous hypertension (idiopathic) without complications of left lower extremity: Secondary | ICD-10-CM | POA: Diagnosis not present

## 2022-10-12 DIAGNOSIS — J449 Chronic obstructive pulmonary disease, unspecified: Secondary | ICD-10-CM | POA: Diagnosis not present

## 2022-10-12 DIAGNOSIS — I2510 Atherosclerotic heart disease of native coronary artery without angina pectoris: Secondary | ICD-10-CM | POA: Diagnosis not present

## 2022-10-12 DIAGNOSIS — D72819 Decreased white blood cell count, unspecified: Secondary | ICD-10-CM | POA: Diagnosis not present

## 2022-10-12 DIAGNOSIS — M8580 Other specified disorders of bone density and structure, unspecified site: Secondary | ICD-10-CM | POA: Diagnosis not present

## 2022-10-12 DIAGNOSIS — I151 Hypertension secondary to other renal disorders: Secondary | ICD-10-CM | POA: Diagnosis not present

## 2022-10-12 DIAGNOSIS — E538 Deficiency of other specified B group vitamins: Secondary | ICD-10-CM | POA: Diagnosis not present

## 2022-10-12 DIAGNOSIS — R918 Other nonspecific abnormal finding of lung field: Secondary | ICD-10-CM | POA: Diagnosis not present

## 2022-10-12 DIAGNOSIS — T783XXA Angioneurotic edema, initial encounter: Secondary | ICD-10-CM | POA: Diagnosis not present

## 2022-10-12 DIAGNOSIS — E785 Hyperlipidemia, unspecified: Secondary | ICD-10-CM | POA: Diagnosis not present

## 2022-10-13 DIAGNOSIS — I8311 Varicose veins of right lower extremity with inflammation: Secondary | ICD-10-CM | POA: Diagnosis not present

## 2022-10-13 DIAGNOSIS — I8312 Varicose veins of left lower extremity with inflammation: Secondary | ICD-10-CM | POA: Diagnosis not present

## 2022-10-13 DIAGNOSIS — L819 Disorder of pigmentation, unspecified: Secondary | ICD-10-CM | POA: Diagnosis not present

## 2022-10-13 DIAGNOSIS — R600 Localized edema: Secondary | ICD-10-CM | POA: Diagnosis not present

## 2022-10-25 ENCOUNTER — Ambulatory Visit: Payer: Medicare Other | Admitting: Podiatry

## 2022-10-25 DIAGNOSIS — L84 Corns and callosities: Secondary | ICD-10-CM | POA: Diagnosis not present

## 2022-10-25 NOTE — Progress Notes (Unsigned)
Subjective:  Patient ID: Jenny Oliver, female    DOB: 06-10-1945,  MRN: [id number]  Jenny Oliver presents to clinic today for:  Chief Complaint  Patient presents with   Callouses    CALLUS BILAT 5 TOTAL  CORN RIGHT 5TH TOE ABN SIGNED   Patient presents for plantar painful calluses today.  PCP is Merri Brunette, MD.  Past Medical History:  Diagnosis Date   Abnormality of gait    due to balance instability,  episodic   Benign paroxysmal positional vertigo    Bilateral lower extremity edema    Chronic insomnia 04/24/2015   COPD with emphysema (HCC)    last chest CT 01-12-2018   DDD (degenerative disc disease), lumbosacral    Full dentures    GERD (gastroesophageal reflux disease)    no meds   Headache    Hiatal hernia    History of esophageal dilatation    2013 balloon dilatation   History of Helicobacter pylori infection    Hypertensive chronic kidney disease with stage 1 through stage 4 chronic kidney disease, or unspecified chronic kidney disease    per pt pcp note in epic   IBS (irritable bowel syndrome)    IBS (irritable bowel syndrome)    Liddle's syndrome    hypertension treated with potassium sparing diuretic, amiloride   Meningioma (HCC)    left cavernous sinus, MRI 09-16-2015 in epic;  followed by neurologist, dr Anne Hahn   OA (osteoarthritis)    Right ureteral stone    Sciatica    Smokers' cough (HCC)    occasionally productive   Vitamin B12 deficiency    Vitamin D deficiency     Past Surgical History:  Procedure Laterality Date   BALLOON DILATION  11/03/2011   Procedure: BALLOON DILATION;  Surgeon: Willis Modena, MD;  Location: [facility];  Service: Endoscopy;  Laterality: N/A;   CYSTOSCOPY WITH URETEROSCOPY AND STENT PLACEMENT Right 10/09/2018   Procedure: CYSTOSCOPY WITH RIGHT  URETEROSCOPY HOLMIUN LASER , RETROGRADE AND STENT PLACEMENT;  Surgeon: Crista Elliot, MD;  Location: [facility];  Service: Urology;  Laterality:  Right;   LAPAROTOMY N/A 07/29/2022   Procedure: EXPLORATORY LAPAROTOMY;  Surgeon: Griselda Miner, MD;  Location: [facility];  Service: General;  Laterality: N/A;   TONSILLECTOMY  child   VAGINAL HYSTERECTOMY  1994   VENTRAL HERNIA REPAIR Left 07/29/2022   Procedure: HERNIA REPAIR VENTRAL ADULT;  Surgeon: Griselda Miner, MD;  Location: [facility];  Service: General;  Laterality: Left;    Allergies  Allergen Reactions   Alpha Blocker Quinazolines Other (See Comments)    unknown   Clindamycin Other (See Comments)    unknown   Fluticasone Dermatitis and Other (See Comments)   Guaifenesin Er Other (See Comments)    unknown   [place] Bread [Wolfiporia Coc[place]] Other (See Comments)    Unknown   Lipitor [Atorvastatin] Nausea And Vomiting   Loratadine Other (See Comments)    unknown   Milk-Related Compounds Other (See Comments)    Unknown   Other     Per pt avoids Bread, peanuts/tree nuts due to bloating  opioids causes nausea and vomiting Lactose intolerance   Peanut-Containing Drug Products Other (See Comments)    Unknown   Rosuvastatin Nausea Only and Other (See Comments)   Zetia [Ezetimibe]     Unknown    Objective:  There were no vitals filed for this visit.  Jenny Oliver is a pleasant  77 y.o. female in NAD. AAO x 3.  Vascular Examination: Capillary refill time is 3-5 seconds to toes bilateral. Palpable pedal pulses b/l LE. Digital hair present b/l. No pedal edema b/l. Skin temperature gradient WNL b/l. No varicosities b/l. No cyanosis or clubbing noted b/l.   Dermatological Examination: Pedal skin with normal turgor, texture and tone b/l. No open wounds. No interdigital macerations b/l.   Hyperkeratotic lesion present, with pain on palpation, located Bilateral plantar medial hallux IPJ, bilateral submet 5, distal right fifth toe, and submet 1 calluses bilateral.  .  Assessment/Plan: 1. Corn or callus     Patient signed an ABN today for this noncovered service due to no class  findings  The hyperkeratotic lesions were sharply debrided/shaved with sterile #313 blade.  Discussed with the patient what is causing the corns/calluses and reviewed treatment options today, including shaving the the painful lesion(s), off-loading techniques and pads, custom orthotics / shoe modifications.   Return in about 3 months (around 01/25/2023) for calluses.   Clerance Lav, DPM, FACFAS [facility]     [address] [place], [place] [address]                Office [phone]  Fax [phone]

## 2022-10-26 DIAGNOSIS — R058 Other specified cough: Secondary | ICD-10-CM | POA: Diagnosis not present

## 2022-10-26 DIAGNOSIS — R490 Dysphonia: Secondary | ICD-10-CM | POA: Diagnosis not present

## 2022-10-26 DIAGNOSIS — Z91018 Allergy to other foods: Secondary | ICD-10-CM | POA: Diagnosis not present

## 2022-10-26 DIAGNOSIS — L509 Urticaria, unspecified: Secondary | ICD-10-CM | POA: Diagnosis not present

## 2022-10-28 DIAGNOSIS — L819 Disorder of pigmentation, unspecified: Secondary | ICD-10-CM | POA: Diagnosis not present

## 2022-10-28 DIAGNOSIS — I8312 Varicose veins of left lower extremity with inflammation: Secondary | ICD-10-CM | POA: Diagnosis not present

## 2022-10-28 DIAGNOSIS — I83893 Varicose veins of bilateral lower extremities with other complications: Secondary | ICD-10-CM | POA: Diagnosis not present

## 2022-10-28 DIAGNOSIS — R600 Localized edema: Secondary | ICD-10-CM | POA: Diagnosis not present

## 2022-10-28 DIAGNOSIS — I8311 Varicose veins of right lower extremity with inflammation: Secondary | ICD-10-CM | POA: Diagnosis not present

## 2022-11-11 ENCOUNTER — Ambulatory Visit
Admission: RE | Admit: 2022-11-11 | Discharge: 2022-11-11 | Disposition: A | Discharge status: Home / Self Care | Payer: Medicare Other | Source: Ambulatory Visit | Attending: Acute Care | Admitting: Acute Care

## 2022-11-11 ENCOUNTER — Other Ambulatory Visit: Payer: Medicare Other

## 2022-11-11 DIAGNOSIS — Z87891 Personal history of nicotine dependence: Secondary | ICD-10-CM | POA: Diagnosis not present

## 2022-11-11 DIAGNOSIS — R911 Solitary pulmonary nodule: Secondary | ICD-10-CM

## 2022-11-11 DIAGNOSIS — R918 Other nonspecific abnormal finding of lung field: Secondary | ICD-10-CM | POA: Diagnosis not present

## 2022-11-11 DIAGNOSIS — K449 Diaphragmatic hernia without obstruction or gangrene: Secondary | ICD-10-CM | POA: Diagnosis not present

## 2022-11-11 DIAGNOSIS — I700 Atherosclerosis of aorta: Secondary | ICD-10-CM | POA: Diagnosis not present

## 2022-11-17 NOTE — Progress Notes (Deleted)
New Patient Note  RE: Jenny Oliver MRN: [id number] DOB: [birth year] Date of Office Visit: 11/18/2022  Consult requested by: Merri Brunette, MD Primary care provider: Merri Brunette, MD  Chief Complaint: No chief complaint on file.  History of Present Illness: I had the pleasure of seeing Jenny Oliver for initial evaluation at the [facility] on 11/17/2022. She is a 77 y.o. female, who is referred here by Merri Brunette, MD for the evaluation of angioedema.  Swelling started about *** ago. Mainly occurs on her ***. Describes them as ***. Individual swelling episodes last about ***. No ecchymosis upon resolution. Associated symptoms include: ***.  Frequency of episodes: ***. Suspected triggers are ***. Denies any *** fevers, chills, changes in medications, foods, personal care products or recent infections. She has tried the following therapies: *** with *** benefit. Systemic steroids ***. Currently on ***.  Previous work up includes: ***. Previous history of swelling: {Blank single:19197::"yes","no"}. Family history of angioedema: {Blank single:19197::"yes","no"}. Patient is up to date with the following cancer screening tests: ***. Ace-inhibitor use: {Blank single:19197::"yes","no"}  Assessment and Plan: Jenny Oliver is a 77 y.o. female with: ***  No follow-ups on file.  No orders of the defined types were placed in this encounter.  Lab Orders  No laboratory test(s) ordered today    Other allergy screening: Asthma: {Blank single:19197::"yes","no"} Rhino conjunctivitis: {Blank single:19197::"yes","no"} Food allergy: {Blank single:19197::"yes","no"} Medication allergy: {Blank single:19197::"yes","no"} Hymenoptera allergy: {Blank single:19197::"yes","no"} Urticaria: {Blank single:19197::"yes","no"} Eczema:{Blank single:19197::"yes","no"} History of recurrent infections suggestive of immunodeficency: {Blank single:19197::"yes","no"}  Diagnostics: Spirometry:   Tracings reviewed. Her effort: {Blank single:19197::"Good reproducible efforts.","It was hard to get consistent efforts and there is a question as to whether this reflects a maximal maneuver.","Poor effort, data can not be interpreted."} FVC: ***L FEV1: ***L, ***% predicted FEV1/FVC ratio: ***% Interpretation: {Blank single:19197::"Spirometry consistent with mild obstructive disease","Spirometry consistent with moderate obstructive disease","Spirometry consistent with severe obstructive disease","Spirometry consistent with possible restrictive disease","Spirometry consistent with mixed obstructive and restrictive disease","Spirometry uninterpretable due to technique","Spirometry consistent with normal pattern","No overt abnormalities noted given today's efforts"}.  Please see scanned spirometry results for details.  Skin Testing: {Blank single:19197::"Select foods","Environmental allergy panel","Environmental allergy panel and select foods","Food allergy panel","None","Deferred due to recent antihistamines use"}. *** Results discussed with patient/family.   Past Medical History: Patient Active Problem List   Diagnosis Date Noted  . SBO (small bowel obstruction) (HCC) 07/29/2022  . Encounter for general adult medical examination with abnormal findings 11/13/2020  . Leukopenia 11/13/2020  . Myalgia due to statin 07/10/2020  . Hyperlipidemia 07/10/2020  . Atherosclerotic heart disease of native coronary artery without angina pectoris 07/10/2020  . Pulmonary emphysema (HCC) 07/10/2020  . Chronic kidney disease, stage 2 (mild) 07/10/2020  . Cigarette smoker 07/10/2020  . Edema of lower extremity 07/10/2020  . Essential hypertension 07/10/2020  . Hiatal hernia 07/10/2020  . History of gastrointestinal disease 07/10/2020  . Hypercalcemia 07/10/2020  . Hypertension secondary to endocrine disorders 07/10/2020  . Nephrolithiasis 07/10/2020  . Noncompliance with treatment 07/10/2020  .  Osteopenia 07/10/2020  . Osteoporosis with current pathological fracture 07/10/2020  . Prediabetes 07/10/2020  . Pure hypercholesterolemia 07/10/2020  . Renovascular hypertension 07/10/2020  . Vitamin B12 deficiency (non anemic) 07/10/2020  . Venous stasis of both lower extremities 04/11/2019  . Bilateral hand pain 10/04/2018  . Mass of finger of right hand 10/04/2018  . Nail deformity 10/04/2018  . Headache 08/27/2015  . Benign paroxysmal positional vertigo 04/24/2015  . Chronic insomnia 04/24/2015  . Gait disorder  11/06/2014  . Tobacco user 09/08/2013  . Allergic rhinitis 09/08/2013  . Lipid disorder 09/08/2013  . Gastroesophageal reflux disease 09/08/2013  . Caregiver stress 09/08/2013   Past Medical History:  Diagnosis Date  . Abnormality of gait    due to balance instability,  episodic  . Benign paroxysmal positional vertigo   . Bilateral lower extremity edema   . Chronic insomnia 04/24/2015  . COPD with emphysema (HCC)    last chest CT 01-12-2018  . DDD (degenerative disc disease), lumbosacral   . Full dentures   . GERD (gastroesophageal reflux disease)    no meds  . Headache   . Hiatal hernia   . History of esophageal dilatation    2013 balloon dilatation  . History of Helicobacter pylori infection   . Hypertensive chronic kidney disease with stage 1 through stage 4 chronic kidney disease, or unspecified chronic kidney disease    per pt pcp note in epic  . IBS (irritable bowel syndrome)   . IBS (irritable bowel syndrome)   . Liddle's syndrome    hypertension treated with potassium sparing diuretic, amiloride  . Meningioma (HCC)    left cavernous sinus, MRI 09-16-2015 in epic;  followed by neurologist, dr Anne Hahn  . OA (osteoarthritis)   . Right ureteral stone   . Sciatica   . Smokers' cough (HCC)    occasionally productive  . Vitamin B12 deficiency   . Vitamin D deficiency    Past Surgical History: Past Surgical History:  Procedure Laterality Date  .  BALLOON DILATION  11/03/2011   Procedure: BALLOON DILATION;  Surgeon: Willis Modena, MD;  Location: [facility];  Service: Endoscopy;  Laterality: N/A;  . CYSTOSCOPY WITH URETEROSCOPY AND STENT PLACEMENT Right 10/09/2018   Procedure: CYSTOSCOPY WITH RIGHT  URETEROSCOPY HOLMIUN LASER , RETROGRADE AND STENT PLACEMENT;  Surgeon: Crista Elliot, MD;  Location: [facility];  Service: Urology;  Laterality: Right;  . LAPAROTOMY N/A 07/29/2022   Procedure: EXPLORATORY LAPAROTOMY;  Surgeon: Griselda Miner, MD;  Location: [facility];  Service: General;  Laterality: N/A;  . TONSILLECTOMY  child  . VAGINAL HYSTERECTOMY  1994  . VENTRAL HERNIA REPAIR Left 07/29/2022   Procedure: HERNIA REPAIR VENTRAL ADULT;  Surgeon: Griselda Miner, MD;  Location: [facility];  Service: General;  Laterality: Left;   Medication List:  Current Outpatient Medications  Medication Sig Dispense Refill  . acetaminophen (TYLENOL) 500 MG tablet Take 2 tablets (1,000 mg total) by mouth every 6 (six) hours as needed. 30 tablet 0  . albuterol (VENTOLIN HFA) 108 (90 Base) MCG/ACT inhaler Inhale 1 puff into the lungs every 6 (six) hours as needed for shortness of breath.    Marland Kitchen aMILoride (MIDAMOR) 5 MG tablet Take 5 mg by mouth daily.    Marland Kitchen aspirin EC 81 MG tablet Take 1 tablet (81 mg total) by mouth daily. Swallow whole. 90 tablet 3  . Coenzyme Q10 (CO Q 10) 10 MG CAPS Take 1 capsule by mouth daily.    Marland Kitchen GLUCOSAMINE-CHONDROITIN DS PO Take 1 tablet by mouth in the morning and at bedtime.    . mirtazapine (REMERON) 15 MG tablet Take 15 mg by mouth at bedtime.    . Tiotropium Bromide Monohydrate (SPIRIVA RESPIMAT) 2.5 MCG/ACT AERS Inhale 2 puffs into the lungs daily. 4 g 6  . traMADol (ULTRAM) 50 MG tablet Take 1 tablet (50 mg total) by mouth every 6 (six) hours as needed. 10 tablet 0   No current facility-administered  medications for this visit.   Allergies: Allergies  Allergen Reactions  . Alpha Blocker Quinazolines Other (See  Comments)    unknown  . Clindamycin Other (See Comments)    unknown  . Fluticasone Dermatitis and Other (See Comments)  . Guaifenesin Er Other (See Comments)    unknown  . [place] Bread [Wolfiporia Cocos] Other (See Comments)    Unknown  . Lipitor [Atorvastatin] Nausea And Vomiting  . Loratadine Other (See Comments)    unknown  . Milk-Related Compounds Other (See Comments)    Unknown  . Other     Per pt avoids Bread, peanuts/tree nuts due to bloating  opioids causes nausea and vomiting Lactose intolerance  . Peanut-Containing Drug Products Other (See Comments)    Unknown  . Rosuvastatin Nausea Only and Other (See Comments)  . Zetia [Ezetimibe]     Unknown   Social History: Social History   Socioeconomic History  . Marital status: Divorced    Spouse name: Not on file  . Number of children: 1  . Years of education: graduate  . Highest education level: Not on file  Occupational History  . Occupation: retired  Tobacco Use  . Smoking status: Every Day    Current packs/day: 1.50    Average packs/day: 1.5 packs/day for 48.0 years (72.0 ttl pk-yrs)    Types: Cigarettes  . Smokeless tobacco: Never  . Tobacco comments:    Started smoking about age 67    06/05/21 down to 0.5 pack    10-04-2018  per pt down to 2-3 cig per day from 1-2 ppd  Vaping Use  . Vaping status: Never Used  Substance and Sexual Activity  . Alcohol use: No  . Drug use: Never  . Sexual activity: Not on file  Other Topics Concern  . Not on file  Social History Narrative   Patient drinks about 2 cups of caffeine daily.   Patient is right handed.   Social Determinants of Health   Financial Resource Strain: Not on file  Food Insecurity: No Food Insecurity (07/30/2022)   Hunger Vital Sign   . Worried About Programme researcher, broadcasting/film/video in the Last Year: Never true   . Ran Out of Food in the Last Year: Never true  Transportation Needs: No Transportation Needs (07/30/2022)   PRAPARE - Transportation   . Lack of  Transportation (Medical): No   . Lack of Transportation (Non-Medical): No  Physical Activity: Not on file  Stress: Not on file  Social Connections: Not on file   Lives in a ***. Smoking: *** Occupation: ***  Environmental HistorySurveyor, minerals in the house: Copywriter, advertising in the family room: {Blank single:19197::"yes","no"} Carpet in the bedroom: {Blank single:19197::"yes","no"} Heating: {Blank single:19197::"electric","gas","heat pump"} Cooling: {Blank single:19197::"central","window","heat pump"} Pet: {Blank single:19197::"yes ***","no"}  Family History: Family History  Problem Relation Age of Onset  . Heart disease Mother   . Hyperlipidemia Mother   . Cancer Father        stomach  . Healthy Brother    Problem                               Relation Asthma                                   *** Eczema                                ***  Food allergy                          *** Allergic rhino conjunctivitis     ***  Review of Systems  Constitutional:  Negative for appetite change, chills, fever and unexpected weight change.  HENT:  Negative for congestion and rhinorrhea.   Eyes:  Negative for itching.  Respiratory:  Negative for cough, chest tightness, shortness of breath and wheezing.   Cardiovascular:  Negative for chest pain.  Gastrointestinal:  Negative for abdominal pain.  Genitourinary:  Negative for difficulty urinating.  Skin:  Negative for rash.  Neurological:  Negative for headaches.   Objective: There were no vitals taken for this visit. There is no height or weight on file to calculate BMI. Physical Exam Vitals and nursing note reviewed.  Constitutional:      Appearance: Normal appearance. She is well-developed.  HENT:     Head: Normocephalic and atraumatic.     Right Ear: Tympanic membrane and external ear normal.     Left Ear: Tympanic membrane and external ear normal.     Nose: Nose normal.     Mouth/Throat:      Mouth: Mucous membranes are moist.     Pharynx: Oropharynx is clear.  Eyes:     Conjunctiva/sclera: Conjunctivae normal.  Cardiovascular:     Rate and Rhythm: Normal rate and regular rhythm.     Heart sounds: Normal heart sounds. No murmur heard.    No friction rub. No gallop.  Pulmonary:     Effort: Pulmonary effort is normal.     Breath sounds: Normal breath sounds. No wheezing, rhonchi or rales.  Musculoskeletal:     Cervical back: Neck supple.  Skin:    General: Skin is warm.     Findings: No rash.  Neurological:     Mental Status: She is alert and oriented to person, place, and time.  Psychiatric:        Behavior: Behavior normal.  The plan was reviewed with the patient/family, and all questions/concerned were addressed.  It was my pleasure to see Jenny Oliver today and participate in her care. Please feel free to contact me with any questions or concerns.  Sincerely,  Wyline Mood, DO Allergy & Immunology  [facility] office: [phone] [facility] office: [phone]

## 2022-11-18 ENCOUNTER — Ambulatory Visit: Payer: Medicare Other | Admitting: Allergy

## 2022-11-22 ENCOUNTER — Other Ambulatory Visit: Payer: Self-pay

## 2022-11-22 DIAGNOSIS — Z87891 Personal history of nicotine dependence: Secondary | ICD-10-CM

## 2022-11-22 DIAGNOSIS — F17210 Nicotine dependence, cigarettes, uncomplicated: Secondary | ICD-10-CM

## 2022-11-22 DIAGNOSIS — Z122 Encounter for screening for malignant neoplasm of respiratory organs: Secondary | ICD-10-CM

## 2022-11-23 ENCOUNTER — Other Ambulatory Visit: Payer: Self-pay

## 2022-11-23 ENCOUNTER — Encounter: Payer: Self-pay | Admitting: Allergy & Immunology

## 2022-11-23 ENCOUNTER — Ambulatory Visit (INDEPENDENT_AMBULATORY_CARE_PROVIDER_SITE_OTHER): Payer: Medicare Other | Admitting: Allergy & Immunology

## 2022-11-23 VITALS — BP 148/88 | HR 77 | Temp 97.9°F | Ht 62.0 in | Wt 105.0 lb

## 2022-11-23 DIAGNOSIS — L500 Allergic urticaria: Secondary | ICD-10-CM

## 2022-11-23 DIAGNOSIS — R220 Localized swelling, mass and lump, head: Secondary | ICD-10-CM

## 2022-11-23 DIAGNOSIS — R899 Unspecified abnormal finding in specimens from other organs, systems and tissues: Secondary | ICD-10-CM | POA: Diagnosis not present

## 2022-11-23 DIAGNOSIS — T783XXD Angioneurotic edema, subsequent encounter: Secondary | ICD-10-CM | POA: Diagnosis not present

## 2022-11-23 NOTE — Progress Notes (Unsigned)
NEW PATIENT  Date of Service/Encounter:  11/23/22  Consult requested by: Merri Brunette, MD   Assessment:   No diagnosis found.  Plan/Recommendations:    There are no Patient Instructions on file for this visit.   {Blank single:19197::"This note in its entirety was forwarded to the Provider who requested this consultation."}  Subjective:   Jenny Oliver is a 77 y.o. female presenting today for evaluation of  Chief Complaint  Patient presents with   Allergies    She is having allergic reaction to all food. Apples,strawberries, sweet cheese,baked spaghetti, macaroni and cheese, crackers.   Food Intolerance    Lactose, dairy product.   Establish Care    SONAKSHI CHAMBERS has a history of the following: Patient Active Problem List   Diagnosis Date Noted   SBO (small bowel obstruction) (HCC) 07/29/2022   Encounter for general adult medical examination with abnormal findings 11/13/2020   Leukopenia 11/13/2020   Myalgia due to statin 07/10/2020   Hyperlipidemia 07/10/2020   Atherosclerotic heart disease of native coronary artery without angina pectoris 07/10/2020   Pulmonary emphysema (HCC) 07/10/2020   Chronic kidney disease, stage 2 (mild) 07/10/2020   Cigarette smoker 07/10/2020   Edema of lower extremity 07/10/2020   Essential hypertension 07/10/2020   Hiatal hernia 07/10/2020   History of gastrointestinal disease 07/10/2020   Hypercalcemia 07/10/2020   Hypertension secondary to endocrine disorders 07/10/2020   Nephrolithiasis 07/10/2020   Noncompliance with treatment 07/10/2020   Osteopenia 07/10/2020   Osteoporosis with current pathological fracture 07/10/2020   Prediabetes 07/10/2020   Pure hypercholesterolemia 07/10/2020   Renovascular hypertension 07/10/2020   Vitamin B12 deficiency (non anemic) 07/10/2020   Venous stasis of both lower extremities 04/11/2019   Bilateral hand pain 10/04/2018   Mass of finger of right hand 10/04/2018   Nail deformity  10/04/2018   Headache 08/27/2015   Benign paroxysmal positional vertigo 04/24/2015   Chronic insomnia 04/24/2015   Gait disorder 11/06/2014   Tobacco user 09/08/2013   Allergic rhinitis 09/08/2013   Lipid disorder 09/08/2013   Gastroesophageal reflux disease 09/08/2013   Caregiver stress 09/08/2013    History obtained from: chart review and {Persons; PED relatives w/patient:19415::"patient"}.  JIMIA BENSTON was referred by Merri Brunette, MD.     Jenny Oliver is a 77 y.o. female presenting for {Blank single:19197::"a food challenge","a drug challenge","skin testing","a sick visit","an evaluation of ***","a follow up visit"}.  She was in [place] on July 13th. She had a reuben, ice cream, and a watery. She had tongue swelling that evening, several hours after eating these foods. She looked up online to see what she could take and she took acetaminophen. She reports that this helped. She then went to Urgent Care. She has had hives now "every time that [she] eats something". She lost her voice on a few occasions. She has had this happen 4-5 times since that time.    {Blank single:19197::"Asthma/Respiratory Symptom History: ***"," "}  {Blank single:19197::"Allergic Rhinitis Symptom History: ***"," "}  {Blank single:19197::"Food Allergy Symptom History: ***"," "}  {Blank single:19197::"Skin Symptom History: ***"," "}  {Blank single:19197::"GERD Symptom History: ***"," "}  ***Otherwise, there is no history of other atopic diseases, including {Blank multiple:19196:o:"asthma","food allergies","drug allergies","environmental allergies","stinging insect allergies","eczema","urticaria","contact dermatitis"}. There is no significant infectious history. ***Vaccinations are up to date.    Past Medical History: Patient Active Problem List   Diagnosis Date Noted   SBO (small bowel obstruction) (HCC) 07/29/2022   Encounter for general adult medical examination with abnormal findings 11/13/2020  Leukopenia  11/13/2020   Myalgia due to statin 07/10/2020   Hyperlipidemia 07/10/2020   Atherosclerotic heart disease of native coronary artery without angina pectoris 07/10/2020   Pulmonary emphysema (HCC) 07/10/2020   Chronic kidney disease, stage 2 (mild) 07/10/2020   Cigarette smoker 07/10/2020   Edema of lower extremity 07/10/2020   Essential hypertension 07/10/2020   Hiatal hernia 07/10/2020   History of gastrointestinal disease 07/10/2020   Hypercalcemia 07/10/2020   Hypertension secondary to endocrine disorders 07/10/2020   Nephrolithiasis 07/10/2020   Noncompliance with treatment 07/10/2020   Osteopenia 07/10/2020   Osteoporosis with current pathological fracture 07/10/2020   Prediabetes 07/10/2020   Pure hypercholesterolemia 07/10/2020   Renovascular hypertension 07/10/2020   Vitamin B12 deficiency (non anemic) 07/10/2020   Venous stasis of both lower extremities 04/11/2019   Bilateral hand pain 10/04/2018   Mass of finger of right hand 10/04/2018   Nail deformity 10/04/2018   Headache 08/27/2015   Benign paroxysmal positional vertigo 04/24/2015   Chronic insomnia 04/24/2015   Gait disorder 11/06/2014   Tobacco user 09/08/2013   Allergic rhinitis 09/08/2013   Lipid disorder 09/08/2013   Gastroesophageal reflux disease 09/08/2013   Caregiver stress 09/08/2013    Medication List:  Allergies as of 11/23/2022       Reactions   Alpha Blocker Quinazolines Other (See Comments)   unknown   Clindamycin Other (See Comments)   unknown   Fluticasone Dermatitis, Other (See Comments)   Guaifenesin Er Other (See Comments)   unknown   [place] Bread [wolfiporia Cocos] Other (See Comments)   Unknown   Lipitor [atorvastatin] Nausea And Vomiting   Loratadine Other (See Comments)   unknown   Milk-related Compounds Other (See Comments)   Unknown   Other    Per pt avoids Bread, peanuts/tree nuts due to bloating  opioids causes nausea and vomiting Lactose intolerance   Peanut-containing  Drug Products Other (See Comments)   Unknown   Rosuvastatin Nausea Only, Other (See Comments)   Zetia [ezetimibe]    Unknown        Medication List        Accurate as of November 23, 2022  3:12 PM. If you have any questions, ask your nurse or doctor.          acetaminophen 500 MG tablet Commonly known as: TYLENOL Take 2 tablets (1,000 mg total) by mouth every 6 (six) hours as needed.   albuterol 108 (90 Base) MCG/ACT inhaler Commonly known as: VENTOLIN HFA Inhale 1 puff into the lungs every 6 (six) hours as needed for shortness of breath.   aMILoride 5 MG tablet Commonly known as: MIDAMOR Take 5 mg by mouth daily.   aspirin EC 81 MG tablet Take 1 tablet (81 mg total) by mouth daily. Swallow whole.   Co Q 10 10 MG Caps Take 1 capsule by mouth daily.   Emergen-C Vitamin C Pack Take by mouth.   EpiPen 2-Pak 0.3 MG/0.3ML Soaj injection Generic drug: EPINEPHrine Inject 0.3 mg into the muscle as needed.   GLUCOSAMINE-CHONDROITIN DS PO Take 1 tablet by mouth in the morning and at bedtime.   mirtazapine 15 MG tablet Commonly known as: REMERON Take 15 mg by mouth at bedtime.   Spiriva Respimat 2.5 MCG/ACT Aers Generic drug: Tiotropium Bromide Monohydrate Inhale 2 puffs into the lungs daily.   traMADol 50 MG tablet Commonly known as: Ultram Take 1 tablet (50 mg total) by mouth every 6 (six) hours as needed.  Birth History: {Blank single:19197::"non-contributory","born premature and spent time in the NICU","born at term without complications"}  Developmental History: Aneri has met all milestones on time. She has required no {Blank multiple:19196:a:"speech therapy","occupational therapy","physical therapy"}. ***non-contributory  Past Surgical History: Past Surgical History:  Procedure Laterality Date   BALLOON DILATION  11/03/2011   Procedure: BALLOON DILATION;  Surgeon: Willis Modena, MD;  Location: [facility];  Service: Endoscopy;  Laterality: N/A;    CYSTOSCOPY WITH URETEROSCOPY AND STENT PLACEMENT Right 10/09/2018   Procedure: CYSTOSCOPY WITH RIGHT  URETEROSCOPY HOLMIUN LASER , RETROGRADE AND STENT PLACEMENT;  Surgeon: Crista Elliot, MD;  [facility];  Service: Urology;  Laterality: Right;   LAPAROTOMY N/A 07/29/2022   Procedure: EXPLORATORY LAPAROTOMY;  Surgeon: Griselda Miner, MD;  Location: [facility];  Service: General;  Laterality: N/A;   TONSILLECTOMY  child   VAGINAL HYSTERECTOMY  1994   VENTRAL HERNIA REPAIR Left 07/29/2022   Procedure: HERNIA REPAIR VENTRAL ADULT;  Surgeon: Griselda Miner, MD;  Location: [facility];  Service: General;  Laterality: Left;     Family History: Family History  Problem Relation Age of Onset   Asthma Mother    Heart disease Mother    Hyperlipidemia Mother    Cancer Father        stomach   Healthy Brother      Social History: Shandrea lives at home with ***.    Review of systems otherwise negative other than that mentioned in the HPI.    Objective:   Blood pressure (!) 148/88, pulse 77, temperature 97.9 F (36.6 C), temperature source Temporal, height 5\' 2"  (1.575 m), weight 105 lb (47.6 kg), SpO2 96%. Body mass index is 19.2 kg/m.     Physical Exam   Diagnostic studies: {Blank single:19197::"none","deferred due to recent antihistamine use","deferred due to insurance stipulations that refuse to pay for testing at initial visits, making it more difficult for patients to get the care they need","labs sent instead"," "}  Spirometry: {Blank single:19197::"results normal (FEV1: ***%, FVC: ***%, FEV1/FVC: ***%)","results abnormal (FEV1: ***%, FVC: ***%, FEV1/FVC: ***%)"}.    {Blank single:19197::"Spirometry consistent with mild obstructive disease","Spirometry consistent with moderate obstructive disease","Spirometry consistent with severe obstructive disease","Spirometry consistent with possible restrictive disease","Spirometry consistent with mixed obstructive and  restrictive disease","Spirometry uninterpretable due to technique","Spirometry consistent with normal pattern"}. {Blank single:19197::"Albuterol/Atrovent nebulizer","Xopenex/Atrovent nebulizer","Albuterol nebulizer","Albuterol four puffs via MDI","Xopenex four puffs via MDI"} treatment given in clinic with {Blank single:19197::"significant improvement in FEV1 per ATS criteria","significant improvement in FVC per ATS criteria","significant improvement in FEV1 and FVC per ATS criteria","improvement in FEV1, but not significant per ATS criteria","improvement in FVC, but not significant per ATS criteria","improvement in FEV1 and FVC, but not significant per ATS criteria","no improvement"}.  Allergy Studies: {Blank single:19197::"none","labs sent instead"," "}    {Blank single:19197::"Allergy testing results were read and interpreted by myself, documented by clinical staff."," "}         Malachi Bonds, MD [facility]

## 2022-11-23 NOTE — Patient Instructions (Addendum)
1. Angioedema and urticaria (hives) - Testing was positive to pork, beef, and banana. - Copy of testing results provided. - There is a the low positive predictive value of food allergy testing and hence the high possibility of false positives. - In contrast, food allergy testing has a high negative predictive value, therefore if testing is negative we can be relatively assured that they are indeed negative.  - So I do not think that you need to avoid any foods aside from red meats and banana right now. - We are going to get some labs to look for weird causes of swelling/itching. - I think that this might be alpha gal syndrome, but we will test for that in the lab work. - We will call you in 1-2 weeks with the results of the testing.  - EpiPen training reviewed.  - Emergency Anaphylaxis Plan provided.  2. Return in about 3 months (around 02/23/2023). You can have the follow up appointment with Dr. Dellis Anes or a Nurse Practicioner (our Nurse Practitioners are excellent and always have Physician oversight!).    Please inform us of any Emergency Department visits, hospitalizations, or changes in symptoms. Call us before going to the ED for breathing or allergy symptoms since we might be able to fit you in for a sick visit. Feel free to contact us anytime with any questions, problems, or concerns.  It was a pleasure to meet you today!  Websites that have reliable patient information: 1. American Academy of Asthma, Allergy, and Immunology: www.aaaai.org 2. Food Allergy Research and Education (FARE): foodallergy.org 3. Mothers of Asthmatics: http://www.asthmacommunitynetwork.org 4. American College of Allergy, Asthma, and Immunology: www.acaai.org   COVID-19 Vaccine Information can be found at: [email or web address] For questions related to vaccine distribution or appointments, please email [email or web address] or call [phone].   We  realize that you might be concerned about having an allergic reaction to the COVID19 vaccines. To help with that concern, WE ARE OFFERING THE COVID19 VACCINES IN OUR OFFICE! Ask the front desk for dates!     "Like" [place] on Facebook and Instagram for our latest updates!      A healthy democracy works best when [employer] participate! Make sure you are registered to vote! If you have moved or changed any of your contact information, you will need to get this updated before voting! Scan the QR codes below to learn more!        Food Adult Perc - 11/23/22 1600     Time Antigen Placed 1604    Allergen Manufacturer Waynette Buttery    Location Back    Number of allergen test 72     Control-buffer 50% Glycerol Negative    Control-Histamine 2+    1. Peanut Negative    2. Soybean Negative    3. Wheat Negative    4. Sesame Negative    5. Milk, Cow Negative    6. Casein Negative    7. Egg White, Chicken Negative    8. Shellfish Mix Negative    9. Fish Mix Negative    10. Cashew Negative    11. Walnut Food Negative    12. Almond Negative    13. Hazelnut Negative    14. Pecan Food Negative    15. Pistachio Negative    16. [place] Nut Negative    17. Coconut Negative    18. Trout Negative    19. Tuna Negative    20. Salmon Negative    21. Flounder Negative    22.  Codfish Negative    23. Shrimp Negative    24. Crab Negative    25. Lobster Negative    26. Oyster Negative    27. Scallops Negative    28. Oat  Negative    29. Rice Negative    30. Barley Negative    31. Rye  Negative    32. Hops Negative    33. [place] Meat Negative    34. Chicken Meat Negative    35. Pork --   4 x 6   36. Beef --   6 x 8   37. Lamb Negative    38. Tomato Negative    39. White Potato Negative    40. Sweet Potato Negative    41. Pea, Green/English Negative    42. [place] Bean Negative    43. Green Beans Negative    44. Squash Negative    45. Green Pepper Negative    46. Mushrooms Negative    47. Onion  Negative    48. Avocado Negative    49. Cabbage Negative    50. Carrots Negative    51. Celery Negative    52. Corn Negative    53. Cucumber Negative    54. Grape (White seedless) Negative    55. Orange  Negative    56. Lemon Negative    57. Banana --   4 x 6   58. Apple Negative    59. Peach Negative    60. Strawberry Negative    61. Blueberry Negative    62. Cherry Negative    63. Cantaloupe Negative    64. Watermelon Negative    65. Pineapple Negative    66. Chocolate/Cacao Bean Negative    67. Cinnamon Negative    68. Nutmeg Negative    69. Ginger Negative    70. Garlic Negative    71. Pepper, Black Negative    72. Mustard Negative

## 2022-12-01 LAB — C3 AND C4
Complement C3, Serum: 112 mg/dL (ref 82–167)
Complement C4, Serum: 24 mg/dL (ref 12–38)

## 2022-12-01 LAB — ALPHA-GAL PANEL
Allergen Lamb IgE: <0.1 kU/L
Beef IgE: <0.1 kU/L
IgE (Immunoglobulin E), Serum: 677 [IU]/mL — ABNORMAL HIGH (ref 6–495)
O215-IgE Alpha-Gal: <0.1 kU/L
Pork IgE: <0.1 kU/L

## 2022-12-01 LAB — C1 ESTERASE INHIBITOR: C1INH SerPl-mCnc: 33 mg/dL (ref 21–39)

## 2022-12-01 LAB — TRYPTASE: Tryptase: 2.4 ug/L (ref 2.2–13.2)

## 2022-12-01 LAB — C1 ESTERASE INHIBITOR, FUNCTIONAL: C1INH Functional/C1INH Total MFr SerPl: >110 %{normal}

## 2022-12-01 LAB — COMPLEMENT COMPONENT C1Q: Complement C1Q: 9.4 mg/dL — ABNORMAL LOW (ref 10.3–20.5)

## 2022-12-01 LAB — ALLERGEN BANANA: Allergen Banana IgE: <0.1 kU/L

## 2022-12-03 NOTE — Addendum Note (Signed)
Addended by: Alfonse Spruce on: 12/03/2022 09:48 AM   Modules accepted: Orders

## 2022-12-06 DIAGNOSIS — H04123 Dry eye syndrome of bilateral lacrimal glands: Secondary | ICD-10-CM | POA: Diagnosis not present

## 2022-12-06 DIAGNOSIS — D3102 Benign neoplasm of left conjunctiva: Secondary | ICD-10-CM | POA: Diagnosis not present

## 2022-12-06 DIAGNOSIS — H5203 Hypermetropia, bilateral: Secondary | ICD-10-CM | POA: Diagnosis not present

## 2022-12-06 DIAGNOSIS — H2513 Age-related nuclear cataract, bilateral: Secondary | ICD-10-CM | POA: Diagnosis not present

## 2022-12-09 DIAGNOSIS — T783XXD Angioneurotic edema, subsequent encounter: Secondary | ICD-10-CM | POA: Diagnosis not present

## 2022-12-09 DIAGNOSIS — R899 Unspecified abnormal finding in specimens from other organs, systems and tissues: Secondary | ICD-10-CM | POA: Diagnosis not present

## 2022-12-11 LAB — ALPHA-GAL PANEL
Allergen Lamb IgE: <0.1 kU/L
Beef IgE: <0.1 kU/L
IgE (Immunoglobulin E), Serum: 606 [IU]/mL — ABNORMAL HIGH (ref 6–495)
O215-IgE Alpha-Gal: <0.1 kU/L
Pork IgE: <0.1 kU/L

## 2022-12-11 LAB — ALLERGEN BANANA: Allergen Banana IgE: <0.1 kU/L

## 2022-12-17 LAB — COMPLEMENT COMPONENT C1Q: Complement C1Q: 14.7 mg/dL (ref 10.3–20.5)

## 2023-01-03 DIAGNOSIS — H11122 Conjunctival concretions, left eye: Secondary | ICD-10-CM | POA: Diagnosis not present

## 2023-01-03 DIAGNOSIS — H2513 Age-related nuclear cataract, bilateral: Secondary | ICD-10-CM | POA: Diagnosis not present

## 2023-01-24 DIAGNOSIS — I8312 Varicose veins of left lower extremity with inflammation: Secondary | ICD-10-CM | POA: Diagnosis not present

## 2023-01-24 DIAGNOSIS — I8311 Varicose veins of right lower extremity with inflammation: Secondary | ICD-10-CM | POA: Diagnosis not present

## 2023-01-24 DIAGNOSIS — R600 Localized edema: Secondary | ICD-10-CM | POA: Diagnosis not present

## 2023-01-24 DIAGNOSIS — I890 Lymphedema, not elsewhere classified: Secondary | ICD-10-CM | POA: Diagnosis not present

## 2023-01-26 ENCOUNTER — Ambulatory Visit (INDEPENDENT_AMBULATORY_CARE_PROVIDER_SITE_OTHER): Payer: Medicare Other | Admitting: Podiatry

## 2023-01-26 DIAGNOSIS — M79671 Pain in right foot: Secondary | ICD-10-CM | POA: Diagnosis not present

## 2023-01-26 DIAGNOSIS — Q828 Other specified congenital malformations of skin: Secondary | ICD-10-CM

## 2023-01-26 DIAGNOSIS — M79672 Pain in left foot: Secondary | ICD-10-CM

## 2023-01-30 ENCOUNTER — Encounter: Payer: Self-pay | Admitting: Podiatry

## 2023-01-30 NOTE — Progress Notes (Signed)
  Subjective:  Patient ID: Jenny Oliver, female    DOB: 1945/05/31,  MRN: [id number]  Jenny Oliver presents to clinic today for painful porokeratotic lesions of both feet. Pain prevent(s) comfortable ambulation. Aggravating factor is weightbearing with and without shoegear. Patient trims her own toenails.  Chief Complaint  Patient presents with   RFC   Callouses    Callouses   New problem(s): None.   PCP is Merri Brunette, MD.  Allergies  Allergen Reactions   Alpha Blocker Quinazolines Other (See Comments)    unknown   Clindamycin Other (See Comments)    unknown   Fluticasone Dermatitis and Other (See Comments)   Guaifenesin Er Other (See Comments)    unknown   [place] Bread [Wolfiporia Cocos] Other (See Comments)    Unknown   Lipitor [Atorvastatin] Nausea And Vomiting   Loratadine Other (See Comments)    unknown   Milk-Related Compounds Other (See Comments)    Unknown   Other     Per pt avoids Bread, peanuts/tree nuts due to bloating  opioids causes nausea and vomiting Lactose intolerance   Peanut-Containing Drug Products Other (See Comments)    Unknown   Rosuvastatin Nausea Only and Other (See Comments)   Zetia [Ezetimibe]     Unknown    Review of Systems: Negative except as noted in the HPI.  Objective: No changes noted in today's physical examination. There were no vitals filed for this visit. Jenny Oliver is a pleasant 77 y.o. female WD, WN in NAD. AAO x 3.  Vascular Examination: Capillary refill time immediate b/l.Vascular status intact b/l with palpable pedal pulses. Pedal hair absent b/l. No edema. No pain with calf compression b/l. Skin temperature gradient WNL b/l. No edema noted b/l LE.  Neurological Examination: Sensation grossly intact b/l with 10 gram monofilament. Vibratory sensation intact b/l.   Dermatological Examination: Pedal skin with normal turgor, texture and tone b/l.  No open wounds b/l LE. No interdigital macerations noted b/l LE.  Toenail(s) 1-5 bilaterally mycotic with adequate length.   Porokeratotic lesion(s) medial DIPJ of L 2nd toe, R 5th toe, submet head 1 left foot, and submet head 5 b/l. No erythema, no edema, no drainage, no fluctuance.  Musculoskeletal Examination: Normal muscle strength 5/5 to all lower extremity muscle groups bilaterally. HAV with bunion bilaterally and hammertoes 2-5 b/l.Marland Kitchen No pain, crepitus or joint limitation noted with ROM b/l LE.  Patient ambulates independently without assistive aids.  Radiographs: None  Assessment/Plan: 1. Porokeratosis   2. Pain in both feet     -Consent given for treatment as described below: -Examined patient. -Medicare ABN on file for paring of corn(s)/callus(es)/porokeratos(es). Copy in patient chart. -Porokeratotic lesion(s) L 2nd toe, right fifth digit, submet head 1 left foot, submet head 5 left foot, and submet head 5 right foot pared and enucleated with sterile currette without incident. Total number of lesions debrided=5. -Patient/POA to call should there be question/concern in the interim.   Return in about 3 months (around 04/28/2023).  Freddie Breech, DPM

## 2023-01-31 ENCOUNTER — Other Ambulatory Visit: Payer: Self-pay | Admitting: Endocrinology

## 2023-01-31 DIAGNOSIS — E041 Nontoxic single thyroid nodule: Secondary | ICD-10-CM | POA: Diagnosis not present

## 2023-02-03 DIAGNOSIS — Z23 Encounter for immunization: Secondary | ICD-10-CM | POA: Diagnosis not present

## 2023-02-10 ENCOUNTER — Ambulatory Visit
Admission: RE | Admit: 2023-02-10 | Discharge: 2023-02-10 | Disposition: A | Discharge status: Home / Self Care | Payer: Medicare Other | Source: Ambulatory Visit | Attending: Endocrinology | Admitting: Endocrinology

## 2023-02-10 DIAGNOSIS — E041 Nontoxic single thyroid nodule: Secondary | ICD-10-CM

## 2023-03-03 ENCOUNTER — Encounter: Payer: Self-pay | Admitting: Allergy & Immunology

## 2023-03-03 ENCOUNTER — Ambulatory Visit (INDEPENDENT_AMBULATORY_CARE_PROVIDER_SITE_OTHER): Payer: Medicare Other | Admitting: Allergy & Immunology

## 2023-03-03 VITALS — BP 138/84 | HR 74 | Temp 98.6°F | Wt 107.9 lb

## 2023-03-03 DIAGNOSIS — R220 Localized swelling, mass and lump, head: Secondary | ICD-10-CM

## 2023-03-03 DIAGNOSIS — T783XXD Angioneurotic edema, subsequent encounter: Secondary | ICD-10-CM

## 2023-03-03 DIAGNOSIS — L500 Allergic urticaria: Secondary | ICD-10-CM | POA: Diagnosis not present

## 2023-03-03 MED ORDER — CETIRIZINE HCL 10 MG PO TABS: 10.0000 mg | ORAL_TABLET | Freq: Every day | ORAL | 3 refills | Status: DC | PRN

## 2023-03-03 MED ORDER — EPIPEN 2-PAK 0.3 MG/0.3ML IJ SOAJ: 0.3000 mg | INTRAMUSCULAR | 1 refills | Status: AC | PRN

## 2023-03-03 NOTE — Patient Instructions (Addendum)
1. Angioedema and urticaria (hives) - Continue to avoid your triggering foods. - You can consider taking your antihistamines as needed. - We have already tested you for the entire food panel. - We ruled out serious causes of swelling. - Take pictures of what it looks like you have a reaction.  - EpiPen is up to date.  2. Return in about 1 year (around 03/02/2024). You can have the follow up appointment with Dr. Dellis Anes or a Nurse Practicioner (our Nurse Practitioners are excellent and always have Physician oversight!).    Please inform us of any Emergency Department visits, hospitalizations, or changes in symptoms. Call us before going to the ED for breathing or allergy symptoms since we might be able to fit you in for a sick visit. Feel free to contact us anytime with any questions, problems, or concerns.  It was a pleasure to see you again today!  Websites that have reliable patient information: 1. American Academy of Asthma, Allergy, and Immunology: www.aaaai.org 2. Food Allergy Research and Education (FARE): foodallergy.org 3. Mothers of Asthmatics: http://www.asthmacommunitynetwork.org 4. American College of Allergy, Asthma, and Immunology: www.acaai.org      "Like" [place] on Facebook and Instagram for our latest updates!      A healthy democracy works best when [employer] participate! Make sure you are registered to vote! If you have moved or changed any of your contact information, you will need to get this updated before voting! Scan the QR codes below to learn more!

## 2023-03-03 NOTE — Progress Notes (Unsigned)
FOLLOW UP  Date of Service/Encounter:  03/03/23   Assessment:   Angioedema - with negative testing to alpha gal syndrome and HAE   Allergic urticaria - with reactivity to pork, beef, and banana   Lip swelling    Plan/Recommendations:   1. Angioedema and urticaria (hives) - Continue to avoid your triggering foods. - You can consider taking your antihistamines as needed. - We have already tested you for the entire food panel. - We ruled out serious causes of swelling. - Take pictures of what it looks like you have a reaction.  - EpiPen is up to date.  2. Return in about 1 year (around 03/02/2024). You can have the follow up appointment with Dr. Dellis Anes or a Nurse Practicioner (our Nurse Practitioners are excellent and always have Physician oversight!).    Subjective:   NANITA KEDROWSKI is a 77 y.o. female presenting today for follow up of  Chief Complaint  Patient presents with   Follow-up    Still itching    LASHUNNA DAZZO has a history of the following: Patient Active Problem List   Diagnosis Date Noted   Abdominal hernia with obstruction and without gangrene 09/01/2022   SBO (small bowel obstruction) (HCC) 07/29/2022   Encounter for general adult medical examination with abnormal findings 11/13/2020   Leukopenia 11/13/2020   Myalgia due to statin 07/10/2020   Hyperlipidemia 07/10/2020   Atherosclerotic heart disease of native coronary artery without angina pectoris 07/10/2020   Pulmonary emphysema (HCC) 07/10/2020   Chronic kidney disease, stage 2 (mild) 07/10/2020   Cigarette smoker 07/10/2020   Edema of lower extremity 07/10/2020   Essential hypertension 07/10/2020   Hiatal hernia 07/10/2020   History of gastrointestinal disease 07/10/2020   Hypercalcemia 07/10/2020   Hypertension secondary to endocrine disorders 07/10/2020   Nephrolithiasis 07/10/2020   Noncompliance with treatment 07/10/2020   Osteopenia 07/10/2020   Osteoporosis with current  pathological fracture 07/10/2020   Prediabetes 07/10/2020   Pure hypercholesterolemia 07/10/2020   Renovascular hypertension 07/10/2020   Vitamin B12 deficiency (non anemic) 07/10/2020   Venous stasis of both lower extremities 04/11/2019   Bilateral hand pain 10/04/2018   Mass of finger of right hand 10/04/2018   Nail deformity 10/04/2018   Headache 08/27/2015   Benign paroxysmal positional vertigo 04/24/2015   Chronic insomnia 04/24/2015   Gait disorder 11/06/2014   Tobacco user 09/08/2013   Allergic rhinitis 09/08/2013   Lipid disorder 09/08/2013   Gastroesophageal reflux disease 09/08/2013   Caregiver stress 09/08/2013    History obtained from: chart review and patient.  Discussed the use of AI scribe software for clinical note transcription with the patient and/or guardian, who gave verbal consent to proceed.  Zoeylynn is a 77 y.o. female presenting for a follow up visit.  She was last seen in August 2024.  At that time, she had testing that was positive to pork, beef, and banana.  We gave her a copy of testing results.  We did some labs to look for causes of swelling and itching.  EpiPen training was provided.  Emergency action plan provided.  Labs are largely normal.  IgE was 606.  Her complement C1q was low, but a repeat was within normal limits.  She  Since last visit, she has been about the same.   Eletha presents with recurrent episodes of hives and tongue swelling. The episodes are unpredictable and seem to be triggered by various foods, including cheese crackers and spaghetti. The patient reports that the onset  of symptoms is usually heralded by a burning sensation in the lips or an abnormal sensation in the tongue. The patient manages these episodes by taking Zyrtec, which reportedly alleviates the symptoms within 10-15 minutes. Occasionally, the patient also takes acetaminophen, which she reports as being effective.  The first episode of this nature occurred in July, two  months after the patient underwent surgery for a small intestinal blockage. The patient was in [place], [place] at the time and sought treatment at an urgent care center where she received a shot of vitamin A among other treatments.  The patient's symptoms are not consistent and seem to be unrelated to specific foods. For instance, she can eat spaghetti one day without any issues, but the same dish can trigger symptoms on another day. The patient has undergone testing for food allergies, which only identified sensitivities to bananas and pork. However, the patient rarely consumes pork and has completely eliminated bananas from her diet.  The patient has an EpiPen but has never had to use it. She prefers to take antihistamines as needed rather than on a daily basis. The unpredictable nature of the allergic reactions, particularly in relation to food, is a source of frustration for the patient.        {Blank single:19197::"Asthma/Respiratory Symptom History: ***"," "}  {Blank single:19197::"Allergic Rhinitis Symptom History: ***"," "}  {Blank single:19197::"Food Allergy Symptom History: ***"," "}  {Blank single:19197::"Skin Symptom History: ***"," "}  {Blank single:19197::"GERD Symptom History: ***"," "}  Otherwise, there have been no changes to her past medical history, surgical history, family history, or social history.    Review of systems otherwise negative other than that mentioned in the HPI.    Objective:   Blood pressure 138/84, pulse 74, temperature 98.6 F (37 C), temperature source Temporal, weight 107 lb 14.4 oz (48.9 kg), SpO2 95%. Body mass index is 19.74 kg/m.    Physical Exam   Diagnostic studies: {Blank single:19197::"none","deferred due to recent antihistamine use","deferred due to insurance stipulations that require a separate visit for testing","labs sent instead"," "}  Spirometry: {Blank single:19197::"results normal (FEV1: ***%, FVC: ***%, FEV1/FVC:  ***%)","results abnormal (FEV1: ***%, FVC: ***%, FEV1/FVC: ***%)"}.    {Blank single:19197::"Spirometry consistent with mild obstructive disease","Spirometry consistent with moderate obstructive disease","Spirometry consistent with severe obstructive disease","Spirometry consistent with possible restrictive disease","Spirometry consistent with mixed obstructive and restrictive disease","Spirometry uninterpretable due to technique","Spirometry consistent with normal pattern"}. {Blank single:19197::"Albuterol/Atrovent nebulizer","Xopenex/Atrovent nebulizer","Albuterol nebulizer","Albuterol four puffs via MDI","Xopenex four puffs via MDI"} treatment given in clinic with {Blank single:19197::"significant improvement in FEV1 per ATS criteria","significant improvement in FVC per ATS criteria","significant improvement in FEV1 and FVC per ATS criteria","improvement in FEV1, but not significant per ATS criteria","improvement in FVC, but not significant per ATS criteria","improvement in FEV1 and FVC, but not significant per ATS criteria","no improvement"}.  Allergy Studies: {Blank single:19197::"none","deferred due to recent antihistamine use","deferred due to insurance stipulations that require a separate visit for testing","labs sent instead"," "}    {Blank single:19197::"Allergy testing results were read and interpreted by myself, documented by clinical staff."," "}      Malachi Bonds, MD  [facility]

## 2023-03-06 ENCOUNTER — Encounter: Payer: Self-pay | Admitting: Allergy & Immunology

## 2023-03-14 DIAGNOSIS — M9903 Segmental and somatic dysfunction of lumbar region: Secondary | ICD-10-CM | POA: Diagnosis not present

## 2023-03-14 DIAGNOSIS — M5136 Other intervertebral disc degeneration, lumbar region with discogenic back pain only: Secondary | ICD-10-CM | POA: Diagnosis not present

## 2023-03-14 DIAGNOSIS — M9904 Segmental and somatic dysfunction of sacral region: Secondary | ICD-10-CM | POA: Diagnosis not present

## 2023-03-14 DIAGNOSIS — M9905 Segmental and somatic dysfunction of pelvic region: Secondary | ICD-10-CM | POA: Diagnosis not present

## 2023-03-29 ENCOUNTER — Other Ambulatory Visit: Payer: Self-pay

## 2023-03-29 ENCOUNTER — Ambulatory Visit: Payer: Medicare Other | Admitting: Internal Medicine

## 2023-03-29 ENCOUNTER — Ambulatory Visit (INDEPENDENT_AMBULATORY_CARE_PROVIDER_SITE_OTHER): Payer: Medicare Other | Admitting: Internal Medicine

## 2023-03-29 VITALS — BP 146/88 | HR 80 | Temp 99.1°F | Wt 104.4 lb

## 2023-03-29 DIAGNOSIS — L501 Idiopathic urticaria: Secondary | ICD-10-CM | POA: Diagnosis not present

## 2023-03-29 DIAGNOSIS — J069 Acute upper respiratory infection, unspecified: Secondary | ICD-10-CM | POA: Diagnosis not present

## 2023-03-29 MED ORDER — CETIRIZINE HCL 10 MG PO TABS: 10.0000 mg | ORAL_TABLET | Freq: Two times a day (BID) | ORAL | 3 refills | Status: AC | PRN

## 2023-03-29 NOTE — Progress Notes (Signed)
 FOLLOW UP Date of Service/Encounter:  03/29/23   Subjective:  Jenny Oliver (DOB: Jun 05, 1945) is a 77 y.o. female who [facility] for follow up for an acute visit with swelling/hives.   History obtained from: chart review and patient. Seen 03/03/2023 with Dr gallagher for chronic angioedema/urticaria. SPT positive previously to pork/banana/beef. Normal c1q on repeat (previously low), normal C1 esterase function.  Normal tryptase. Negative alpha gal.  Of note, followed by Pulm for emphysema.   Reports recently having a cold with stuffy nose, cough, feeling tired. She then developed hives and tongue/lip swelling.  Also feeling hoarseness/sore throat.  No high fevers.  Taken tylenol  and felt better.  Also uses Zyrtec  PRN.  Hives/swelling are not frequent so she does not want to take medicines daily; reports maybe once a month and short lived. They resolve whenever she takes the Tylenol  or Zyrtec .  Past Medical History: Past Medical History:  Diagnosis Date   Abnormality of gait    due to balance instability,  episodic   Benign paroxysmal positional vertigo    Bilateral lower extremity edema    Chronic insomnia 04/24/2015   COPD with emphysema (HCC)    last chest CT 01-12-2018   DDD (degenerative disc disease), lumbosacral    Full dentures    GERD (gastroesophageal reflux disease)    no meds   Headache    Hiatal hernia    History of esophageal dilatation    2013 balloon dilatation   History of Helicobacter pylori infection    Hypertensive chronic kidney disease with stage 1 through stage 4 chronic kidney disease, or unspecified chronic kidney disease    per pt pcp note in epic   IBS (irritable bowel syndrome)    IBS (irritable bowel syndrome)    Liddle's syndrome    hypertension treated with potassium sparing diuretic, amiloride   Meningioma (HCC)    left cavernous sinus, MRI 09-16-2015 in epic;  followed by neurologist, dr jenel   OA  (osteoarthritis)    Right ureteral stone    Sciatica    Smokers' cough (HCC)    occasionally productive   Vitamin B12 deficiency    Vitamin D deficiency     Objective:  BP (!) 146/88 (BP Location: Left Arm, Patient Position: Sitting, Cuff Size: Small)   Pulse 80   Temp 99.1 F (37.3 C) (Temporal)   Wt 104 lb 6.4 oz (47.4 kg)   SpO2 95%   BMI 19.10 kg/m  Body mass index is 19.1 kg/m. Physical Exam: GEN: alert, well developed HEENT: clear conjunctiva, nose with mild inferior turbinate hypertrophy, pink nasal mucosa, no rhinorrhea, no cobblestoning, no posterior pharyngeal erythema or exudates,no lip swelling on exam  HEART: regular rate and rhythm, no murmur LUNGS: clear to auscultation bilaterally, no coughing, unlabored respiration SKIN: no rashes or lesions, no hives on exam   Assessment:   1. Idiopathic urticaria   2. Viral URI with cough     Plan/Recommendations:  Angioedema and urticaria (hives/swelling) - Hives can be caused by a variety of different triggers including illness/infection, exercise, pressure, vibrations, extremes of temperature to name a few however majority of the time there is no identifiable trigger.  - Continue avoidance of pork, beef, banana.  SPT positive 10/2022.  - Current episode is likely related to viral infection-cold. - Start Zyrtec  10mg  twice daily.  Once symptoms resolve and you are feeling better, can use the Zyrtec  as needed.   Viral URI  with cough - Symptomatic care at home with tylenol  PRN.   Keep follow up in 1 year.   Arleta Blanch, MD [facility] 

## 2023-03-29 NOTE — Patient Instructions (Addendum)
 Angioedema and urticaria (hives/swelling) - Hives can be caused by a variety of different triggers including illness/infection, exercise, pressure, vibrations, extremes of temperature to name a few however majority of the time there is no identifiable trigger.  - Current episode is likely related to viral infection-cold. - Start Zyrtec  10mg  twice daily.  Once symptoms resolve and you are feeling better, can use the Zyrtec  as needed.

## 2023-04-18 DIAGNOSIS — M9903 Segmental and somatic dysfunction of lumbar region: Secondary | ICD-10-CM | POA: Diagnosis not present

## 2023-04-18 DIAGNOSIS — M9904 Segmental and somatic dysfunction of sacral region: Secondary | ICD-10-CM | POA: Diagnosis not present

## 2023-04-18 DIAGNOSIS — M9905 Segmental and somatic dysfunction of pelvic region: Secondary | ICD-10-CM | POA: Diagnosis not present

## 2023-04-18 DIAGNOSIS — M5136 Other intervertebral disc degeneration, lumbar region with discogenic back pain only: Secondary | ICD-10-CM | POA: Diagnosis not present

## 2023-04-19 ENCOUNTER — Other Ambulatory Visit (HOSPITAL_BASED_OUTPATIENT_CLINIC_OR_DEPARTMENT_OTHER): Payer: Self-pay | Admitting: Pulmonary Disease

## 2023-04-19 DIAGNOSIS — F17200 Nicotine dependence, unspecified, uncomplicated: Secondary | ICD-10-CM | POA: Diagnosis not present

## 2023-04-19 DIAGNOSIS — R053 Chronic cough: Secondary | ICD-10-CM | POA: Diagnosis not present

## 2023-04-19 DIAGNOSIS — J449 Chronic obstructive pulmonary disease, unspecified: Secondary | ICD-10-CM | POA: Diagnosis not present

## 2023-04-21 ENCOUNTER — Encounter (HOSPITAL_BASED_OUTPATIENT_CLINIC_OR_DEPARTMENT_OTHER): Payer: Self-pay | Admitting: Pulmonary Disease

## 2023-04-21 ENCOUNTER — Ambulatory Visit (HOSPITAL_BASED_OUTPATIENT_CLINIC_OR_DEPARTMENT_OTHER): Payer: Medicare Other | Admitting: Pulmonary Disease

## 2023-04-21 VITALS — BP 148/72 | HR 85 | Resp 14 | Ht 62.0 in | Wt 109.9 lb

## 2023-04-21 DIAGNOSIS — R911 Solitary pulmonary nodule: Secondary | ICD-10-CM | POA: Diagnosis not present

## 2023-04-21 DIAGNOSIS — J439 Emphysema, unspecified: Secondary | ICD-10-CM | POA: Diagnosis not present

## 2023-04-21 DIAGNOSIS — Z720 Tobacco use: Secondary | ICD-10-CM | POA: Diagnosis not present

## 2023-04-21 MED ORDER — ANORO ELLIPTA 62.5-25 MCG/ACT IN AEPB: 1.0000 | INHALATION_SPRAY | Freq: Every day | RESPIRATORY_TRACT | 5 refills | Status: DC

## 2023-04-21 NOTE — Patient Instructions (Signed)
Emphysema - worsening symptoms with colder weather --STOP Spiriva --START Anoro ONE puff ONCE a day. Patient has milk allergy to food products but willing to try inhaler --CONTINUE Proventil TWO puffs AS NEEDED for shortness of breath or wheezing --Please continue to work on quitting on smoking

## 2023-04-21 NOTE — Progress Notes (Signed)
Subjective:   PATIENT ID: Jenny Oliver GENDER: female DOB: 01/20/1946, MRN: [id number]   HPI  Chief Complaint  Patient presents with   Follow-up    Reason for Visit: Follow-up emphysema  [name] is a 78 year old female active smoker with COPD, allergic rhinitis, GERD, hyperlipidemia, osteoarthritis Liddle's syndrome who presents for follow-up emphysema.  Initial consult 06/06/22 She has been referred by her PCP Dr. Renne Crigler for pulmonary evaluation.  She was seen on 05/18/2021.  Advised to quit smoking and prescribed Proventil HFA.  She is using her rescue inhaler 1-2 times a day with improvement in her shortness of breath associated with chest tightness. Also improves with rest. Occasional non-productive cough. Triggered by cold air. Her symptoms have worsening in the last six months with more frequent shortness of breath. Denies wheezing and no nocturnal symptoms.   07/16/22 Since our last visit she was started on Spiriva and reports her shortness of breath, cough and chest tightness had improved by 95%. Does have occasional coughing spells. She was contacted regarding her pulmonary nodules on 05/11/22. She is still lighting up cigarettes 2-5 a day. Has not tried her Chantix. No exacerbations since our last visit  04/21/23 Since our last visit she reports she is compliant with Spiriva. Uses albuterol every 2-3 months. Cold air worsens her coughing. Shortness of breath is fine. Denies chest tightness. Denies exacerbations since our last visit. Any symptoms she has usually is taken care with albuterol. She still has smoking 2 cigarettes daily.  Social History: Active smoker. Smokes 1/4 ppd. 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   Past Medical History:  Diagnosis Date   Abnormality of gait    due to balance instability,  episodic   Benign paroxysmal positional vertigo    Bilateral lower extremity edema    Chronic insomnia 04/24/2015   COPD with emphysema  (HCC)    last chest CT 01-12-2018   DDD (degenerative disc disease), lumbosacral    Full dentures    GERD (gastroesophageal reflux disease)    no meds   Headache    Hiatal hernia    History of esophageal dilatation    2013 balloon dilatation   History of Helicobacter pylori infection    Hypertensive chronic kidney disease with stage 1 through stage 4 chronic kidney disease, or unspecified chronic kidney disease    per pt pcp note in epic   IBS (irritable bowel syndrome)    IBS (irritable bowel syndrome)    Liddle's syndrome    hypertension treated with potassium sparing diuretic, amiloride   Meningioma (HCC)    left cavernous sinus, MRI 09-16-2015 in epic;  followed by neurologist, dr Anne Hahn   OA (osteoarthritis)    Right ureteral stone    Sciatica    Smokers' cough (HCC)    occasionally productive   Vitamin B12 deficiency    Vitamin D deficiency      Family History  Problem Relation Age of Onset   Asthma Mother    Heart disease Mother    Hyperlipidemia Mother    Cancer Father        stomach   Healthy Brother      Social History   Occupational History   Occupation: retired  Tobacco Use   Smoking status: Every Day    Current packs/day: 1.50    Average packs/day: 1.5 packs/day for 48.0 years (72.0 ttl pk-yrs)    Types: Cigarettes   Smokeless tobacco: Never   Tobacco  comments:    Started smoking about age 65    06/05/21 down to 0.5 pack    10-04-2018  per pt down to 2-3 cig per day from 1-2 ppd  Vaping Use   Vaping status: Never Used  Substance and Sexual Activity   Alcohol use: No   Drug use: Never   Sexual activity: Not on file    Allergies  Allergen Reactions   Alpha Blocker Quinazolines Other (See Comments)    unknown   Clindamycin Other (See Comments)    unknown   Fluticasone Dermatitis and Other (See Comments)   Guaifenesin Er Other (See Comments)    unknown   [place] Bread [Wolfiporia Cocos] Other (See Comments)    Unknown   Lipitor  [Atorvastatin] Nausea And Vomiting   Loratadine Other (See Comments)    unknown   Milk-Related Compounds Other (See Comments)    Unknown   Other     Per pt avoids Bread, peanuts/tree nuts due to bloating  opioids causes nausea and vomiting Lactose intolerance   Peanut-Containing Drug Products Other (See Comments)    Unknown   Rosuvastatin Nausea Only and Other (See Comments)   Zetia [Ezetimibe]     Unknown     Outpatient Medications Prior to Visit  Medication Sig Dispense Refill   acetaminophen (TYLENOL) 500 MG tablet Take 2 tablets (1,000 mg total) by mouth every 6 (six) hours as needed. 30 tablet 0   albuterol (VENTOLIN HFA) 108 (90 Base) MCG/ACT inhaler Inhale 1 puff into the lungs every 6 (six) hours as needed for shortness of breath.     aMILoride (MIDAMOR) 5 MG tablet Take 5 mg by mouth daily.     aspirin EC 81 MG tablet Take 1 tablet (81 mg total) by mouth daily. Swallow whole. 90 tablet 3   cetirizine (ZYRTEC) 10 MG tablet Take 1 tablet (10 mg total) by mouth 2 (two) times daily as needed for allergies (or hives or swelling). 180 tablet 3   Coenzyme Q10 (CO Q 10) 10 MG CAPS Take 1 capsule by mouth daily.     EPIPEN 2-PAK 0.3 MG/0.3ML SOAJ injection Inject 0.3 mg into the muscle as needed. 2 each 1   GLUCOSAMINE-CHONDROITIN DS PO Take 1 tablet by mouth in the morning and at bedtime.     REPATHA SURECLICK 140 MG/ML SOAJ 140 mg.     Tiotropium Bromide Monohydrate (SPIRIVA RESPIMAT) 2.5 MCG/ACT AERS inhale TWO puffs into THE lungs DAILY 4 g 1   cyanocobalamin 100 MCG tablet Take 100 mcg by mouth daily.     mirtazapine (REMERON) 15 MG tablet Take 15 mg by mouth at bedtime. (Patient not taking: Reported on 11/23/2022)     Multiple Vitamins-Minerals (EMERGEN-C VITAMIN C) PACK Take by mouth. (Patient not taking: Reported on 03/29/2023)     traMADol (ULTRAM) 50 MG tablet Take 1 tablet (50 mg total) by mouth every 6 (six) hours as needed. (Patient not taking: Reported on 03/29/2023) 10  tablet 0   No facility-administered medications prior to visit.    Review of Systems  Constitutional:  Negative for chills, diaphoresis, fever, malaise/fatigue and weight loss.  HENT:  Negative for congestion.   Respiratory:  Positive for cough. Negative for hemoptysis, sputum production, shortness of breath and wheezing.   Cardiovascular:  Negative for chest pain, palpitations and leg swelling.     Objective:   Vitals:   04/21/23 1448  BP: (!) 148/72  Pulse: 85  Resp: 14  SpO2: 99%  Weight:  109 lb 14.4 oz (49.9 kg)  Height: 5\' 2"  (1.575 m)   SpO2: 99 %  Physical Exam: General: Well-appearing, no acute distress HENT: [place], AT Eyes: EOMI, no scleral icterus Respiratory: Clear to auscultation bilaterally.  No crackles, wheezing or rales Cardiovascular: RRR, -M/R/G, no JVD Extremities:-Edema,-tenderness Neuro: AAO x4, CNII-XII grossly intact Psych: Normal mood, normal affect  Data Reviewed:  Imaging: CT lung screening 05/11/2021-moderate centrilobular emphysema with diffuse bronchial wall thickening.  No growth or interval of previously seen pulmonary nodules CT lung screen 05/13/22 - moderate centrilobular emphysema. Scattered nodules with new tiny bilaterally pulmonary nodules with larges 5-6 mm in RUL CT lung screen 11/11/22 - moderate centrilobular emphysema. Unchanged or decreased pulmonary nodules. Interval LUL ground glass nodule 3.9 mm  PFT: 05/13/2016 FVC 2.11 (93%) FEV1 1.48 (84%) Ratio 68  Interpretation: Mild obstructive defect is present.  No significant bronchodilator response however does not preclude the use of bronchodilators.  F-V loops suggestive of obstructive defect  Labs: OSH absolute eosinophils 09/17/2020-100     Assessment & Plan:   Discussion: 78 year old female active smoker with COPD, allergic rhinitis, GERD, HLD, osteoarthritis, Liddles syndrome who presents for follow-up. Previously improved respiratory symptoms on LAMA however worsening  cough triggered by cold weather. Will step up to LAMA/LABA. Stiolto not covered but Anoro is by insurance. Counseled on smoking cessation.  Emphysema - worsening symptoms with colder weather --STOP Spiriva --START Anoro ONE puff ONCE a day. Patient has milk allergy to food products but willing to try inhaler --CONTINUE Proventil TWO puffs AS NEEDED for shortness of breath or wheezing  Tobacco abuse Patient is an active smoker. We discussed smoking cessation for <3 minutes. We discussed triggers and stressors and ways to deal with them. We discussed barriers to continued smoking and benefits of smoking cessation. Provided patient with information cessation techniques and interventions including [place] quitline.  Waxing and waning subcentimeter nodules New LUL 3.8 mm nodule --Scheduled for CT lung screen in 10/2023  Health Maintenance Immunization History  Administered Date(s) Administered   Pneumococcal Conjugate-13 11/26/2016   Pneumococcal Polysaccharide-23 11/28/2007   CT Lung Screen - enrolled. Next due in 10/2023  No orders of the defined types were placed in this encounter.  Meds ordered this encounter  Medications   umeclidinium-vilanterol (ANORO ELLIPTA) 62.5-25 MCG/ACT AEPB    Sig: Inhale 1 puff into the lungs daily.    Dispense:  60 each    Refill:  5    Return in about 9 months (around 01/19/2024).  I have spent a total time of 35-minutes on the day of the appointment including chart review, data review, collecting history, coordinating care and discussing medical diagnosis and plan with the patient/family. Past medical history, allergies, medications were reviewed. Pertinent imaging, labs and tests included in this note have been reviewed and interpreted independently by me.   Schae Cando Mechele Collin, MD [facility] 04/21/2023 3:33 PM  Office Number [phone]

## 2023-04-25 DIAGNOSIS — M5136 Other intervertebral disc degeneration, lumbar region with discogenic back pain only: Secondary | ICD-10-CM | POA: Diagnosis not present

## 2023-04-25 DIAGNOSIS — M9905 Segmental and somatic dysfunction of pelvic region: Secondary | ICD-10-CM | POA: Diagnosis not present

## 2023-04-25 DIAGNOSIS — M9903 Segmental and somatic dysfunction of lumbar region: Secondary | ICD-10-CM | POA: Diagnosis not present

## 2023-04-25 DIAGNOSIS — M9904 Segmental and somatic dysfunction of sacral region: Secondary | ICD-10-CM | POA: Diagnosis not present

## 2023-04-28 ENCOUNTER — Ambulatory Visit: Payer: Medicare Other | Attending: Physician Assistant | Admitting: Physician Assistant

## 2023-04-28 ENCOUNTER — Encounter: Payer: Self-pay | Admitting: Physician Assistant

## 2023-04-28 VITALS — BP 142/80 | HR 82 | Ht 62.0 in | Wt 104.6 lb

## 2023-04-28 DIAGNOSIS — I2510 Atherosclerotic heart disease of native coronary artery without angina pectoris: Secondary | ICD-10-CM | POA: Diagnosis not present

## 2023-04-28 DIAGNOSIS — J432 Centrilobular emphysema: Secondary | ICD-10-CM | POA: Insufficient documentation

## 2023-04-28 DIAGNOSIS — I2729 Other secondary pulmonary hypertension: Secondary | ICD-10-CM | POA: Insufficient documentation

## 2023-04-28 DIAGNOSIS — E785 Hyperlipidemia, unspecified: Secondary | ICD-10-CM | POA: Insufficient documentation

## 2023-04-28 DIAGNOSIS — F17210 Nicotine dependence, cigarettes, uncomplicated: Secondary | ICD-10-CM | POA: Insufficient documentation

## 2023-04-28 MED ORDER — REPATHA SURECLICK 140 MG/ML ~~LOC~~ SOAJ: 140.0000 mg | SUBCUTANEOUS | 3 refills | Status: DC

## 2023-04-28 MED ORDER — REPATHA SURECLICK 140 MG/ML ~~LOC~~ SOAJ
140.0000 mg | SUBCUTANEOUS | 3 refills | Status: DC
Start: 2023-04-28 — End: 2023-04-28

## 2023-04-28 NOTE — Patient Instructions (Signed)
Medication Instructions:  Repatha script sent to pharmacy with new instructions.  *If you need a refill on your cardiac medications before your next appointment, please call your pharmacy*   Lab Work: Lipid profile and liver function test today. If you have labs (blood work) drawn today and your tests are completely normal, you will receive your results only by: MyChart Message (if you have MyChart) OR A paper copy in the mail If you have any lab test that is abnormal or we need to change your treatment, we will call you to review the results.   Follow-Up: At [facility], you and your health needs are our priority.  As part of our continuing mission to provide you with exceptional heart care, we have created designated Provider Care Teams.  These Care Teams include your primary Cardiologist (physician) and Advanced Practice Providers (APPs -  Physician Assistants and Nurse Practitioners) who all work together to provide you with the care you need, when you need it.  We recommend signing up for the patient portal called "MyChart".  Sign up information is provided on this After Visit Summary.  MyChart is used to connect with patients for Virtual Visits (Telemedicine).  Patients are able to view lab/test results, encounter notes, upcoming appointments, etc.  Non-urgent messages can be sent to your provider as well.   To learn more about what you can do with MyChart, go to [email or web address].    Your next appointment:   6 month(s)  Provider:   Donato Schultz, MD     Other Instructions

## 2023-04-28 NOTE — Progress Notes (Signed)
Cardiology Office Note:  .   Date:  04/28/2023  ID:  Jenny Oliver, DOB [birth year], MRN [id number] PCP: Merri Brunette, MD  [facility] Providers Cardiologist:  Donato Schultz, MD {    History of Present Illness: Marland Kitchen   Jenny Oliver is a 78 y.o. female with past medical history of CAD without angina referred to cardiology from primary care due to recent CT with left main and LAD/CFX calcification.  Other problems include secondary pulmonary hypertension, emphysema/COPD.  She presents for follow-up appointment.  She was last seen May 2023.  At that time she is still smoking cigarettes.  No chest discomfort.  Has dense calcification in coronary arteries and aorta on CT scan which was done for cancer screening earlier that year.  She denies orthopnea, PND, lower extremity edema, and palpitations.  At that appointment smoking cessation was encouraged.  LDL target was less than 70.  Recommended continuing with an aspirin a day as well as Praluent.  Today, she presents with a history of coronary disease and COPD, reports no new cardiac issues since her last visit. She does experience shortness of breath, which she attributes to her COPD. The patient is a smoker, but has reduced her smoking to about two cigarettes a day. She has no plans to quit smoking completely.  The patient is currently on Repatha injections every two weeks and daily aspirin for her coronary disease. She reports no issues or side effects with her medications. However, her LDL cholesterol level was 409 at her last check, which is higher than the ideal level for someone with coronary disease. The patient has had intolerances to several cholesterol medications in the past, including rosuvastatin, Zetia, and Lipitor.  The patient also mentions a plant-based protein shake she has been consuming, which is suitable for her lactose intolerance. She consumes this shake in the morning and reports feeling good afterwards.  Reports no  shortness of breath nor dyspnea on exertion. Reports no chest pain, pressure, or tightness. No edema, orthopnea, PND. Reports no palpitations.   Discussed the use of AI scribe software for clinical note transcription with the patient, who gave verbal consent to proceed.  ROS: Pertinent ROS in HPI  Studies Reviewed: Marland Kitchen       Lexiscan Myoview 05/30/20 The left ventricular ejection fraction is normal (55-65%). Nuclear stress EF: 65%. There was no ST segment deviation noted during stress. The study is normal. This is a low risk study.   Low risk stress nuclear study with normal perfusion and normal left ventricular regional and global systolic function.     Physical Exam:   VS:  BP (!) 142/80   Pulse 82   Ht 5\' 2"  (1.575 m)   Wt 104 lb 9.6 oz (47.4 kg)   SpO2 96%   BMI 19.13 kg/m    Wt Readings from Last 3 Encounters:  04/28/23 104 lb 9.6 oz (47.4 kg)  04/21/23 109 lb 14.4 oz (49.9 kg)  03/29/23 104 lb 6.4 oz (47.4 kg)    GEN: Well nourished, well developed in no acute distress NECK: No JVD; No carotid bruits CARDIAC: RRR, no murmurs, rubs, gallops RESPIRATORY:  Clear to auscultation without rales, wheezing or rhonchi  ABDOMEN: Soft, non-tender, non-distended EXTREMITIES:  No edema; No deformity   ASSESSMENT AND PLAN: .    Coronary Artery Disease LDL of 100 on Repatha and aspirin. Discussed the goal of LDL <70 in patients with coronary disease. Noted multiple statin intolerances. -Order lipid panel and  LFTs today. -Consider consultation with clinical pharmacist if LDL remains elevated to discuss alternative lipid-lowering therapies.  COPD/centrilobular emphysema Reports shortness of breath attributed to COPD. Current smoker with reduced intake to 1-2 cigarettes per day. -Encourage continued smoking cessation efforts.  General Health Maintenance / Followup Plans -Schedule follow-up appointment with Dr. Anne Fu in 6 months. -Refill Repatha prescription.   Hyperlipidemia LDL  goal less than 70 -recent LDL 100, will repeat lipid panel and LFTs today -refer to Pharm[facility] lipid clinic if LDL remains high given multiple medication intolerances     Dispo: 6 months with Dr. Anne Fu  Signed, Sharlene Dory, PA-C

## 2023-04-29 LAB — HEPATIC FUNCTION PANEL
ALT: 9 [IU]/L (ref 0–32)
AST: 9 [IU]/L (ref 0–40)
Albumin: 4.7 g/dL (ref 3.8–4.8)
Alkaline Phosphatase: 111 [IU]/L (ref 44–121)
Bilirubin Total: 0.7 mg/dL (ref 0.0–1.2)
Bilirubin, Direct: 0.25 mg/dL (ref 0.00–0.40)
Total Protein: 6.8 g/dL (ref 6.0–8.5)

## 2023-04-29 LAB — LIPID PANEL
Chol/HDL Ratio: 2.7 {ratio} (ref 0.0–4.4)
Cholesterol, Total: 166 mg/dL (ref 100–199)
HDL: 62 mg/dL (ref >39)
LDL Chol Calc (NIH): 89 mg/dL (ref 0–99)
Triglycerides: 82 mg/dL (ref 0–149)
VLDL Cholesterol Cal: 15 mg/dL (ref 5–40)

## 2023-05-03 ENCOUNTER — Other Ambulatory Visit: Payer: Self-pay

## 2023-05-03 DIAGNOSIS — E789 Disorder of lipoprotein metabolism, unspecified: Secondary | ICD-10-CM

## 2023-05-03 DIAGNOSIS — E7800 Pure hypercholesterolemia, unspecified: Secondary | ICD-10-CM

## 2023-05-11 ENCOUNTER — Encounter: Payer: Self-pay | Admitting: Podiatry

## 2023-05-11 ENCOUNTER — Ambulatory Visit (INDEPENDENT_AMBULATORY_CARE_PROVIDER_SITE_OTHER): Payer: Medicare Other | Admitting: Podiatry

## 2023-05-11 DIAGNOSIS — Q828 Other specified congenital malformations of skin: Secondary | ICD-10-CM

## 2023-05-11 DIAGNOSIS — M79671 Pain in right foot: Secondary | ICD-10-CM | POA: Diagnosis not present

## 2023-05-11 DIAGNOSIS — M79672 Pain in left foot: Secondary | ICD-10-CM

## 2023-05-19 NOTE — Progress Notes (Signed)
 Subjective:  Patient ID: Jenny Oliver, female    DOB: March 19, 1946,  MRN: [id number]  TALESHA ELLITHORPE presents to clinic today for painful porokeratotic lesions b/l feet. Pain prevent(s) comfortable ambulation. Aggravating factor is weightbearing with and without shoegear.  Chief Complaint  Patient presents with   RFC    She is here for callous , PCP is Dr. Renne Crigler and last seen "sometime last year"   New problem(s): None.   PCP is Merri Brunette, MD.  Allergies  Allergen Reactions   Alpha Blocker Quinazolines Other (See Comments)    unknown   Clindamycin Other (See Comments)    unknown   Fluticasone Dermatitis and Other (See Comments)   Guaifenesin Er Other (See Comments)    unknown   [place] Bread [[place] Root (Wolfiporia Cocos)] Other (See Comments)    Unknown   Lipitor [Atorvastatin] Nausea And Vomiting   Loratadine Other (See Comments)    unknown   Milk-Related Compounds Other (See Comments)    Unknown   Other     Per pt avoids Bread, peanuts/tree nuts due to bloating  opioids causes nausea and vomiting Lactose intolerance   Peanut-Containing Drug Products Other (See Comments)    Unknown   Rosuvastatin Nausea Only and Other (See Comments)   Zetia [Ezetimibe]     Unknown    Review of Systems: Negative except as noted in the HPI.  Objective: No changes noted in today's physical examination. There were no vitals filed for this visit. Jenny Oliver is a pleasant 78 y.o. female WD, WN in NAD. AAO x 3.  Vascular Examination: Capillary refill time immediate b/l.Vascular status intact b/l with palpable pedal pulses. Pedal hair absent b/l. No edema. No pain with calf compression b/l. Skin temperature gradient WNL b/l. No edema noted b/l LE.  Neurological Examination: Sensation grossly intact b/l with 10 gram monofilament. Vibratory sensation intact b/l.   Dermatological Examination: Pedal skin with normal turgor, texture and tone b/l.  No open wounds b/l LE. No interdigital  macerations noted b/l LE. Toenail(s) 1-5 bilaterally mycotic with adequate length.   Porokeratotic lesion(s) medial DIPJ of L 2nd toe, R 5th toe, submet head 1 left foot, and submet head 5 b/l. No erythema, no edema, no drainage, no fluctuance.  Musculoskeletal Examination: Normal muscle strength 5/5 to all lower extremity muscle groups bilaterally. HAV with bunion bilaterally and hammertoes 2-5 b/l.Marland Kitchen No pain, crepitus or joint limitation noted with ROM b/l LE.  Patient ambulates independently without assistive aids.  Radiographs: None  Assessment/Plan: 1. Porokeratosis   2. Pain in both feet    -Patient was evaluated today. All questions/concerns addressed on today's visit. -Patient to continue soft, supportive shoe gear daily. -Porokeratotic lesion(s) left second digit, R 5th toe, submet head 1 left foot, and submet head 5 b/l pared and enucleated with sterile currette without incident. Total number of lesions debrided=5. -Patient/POA to call should there be question/concern in the interim.   Return in about 3 months (around 08/08/2023).  Freddie Breech, DPM      [facility] LOCATION: [address] [place] [address]                   Office [phone]  [phone]   [facility] LOCATION: [address] [place], [place] [address] Office [phone]

## 2023-06-24 ENCOUNTER — Ambulatory Visit: Payer: Medicare Other | Attending: Internal Medicine | Admitting: Pharmacist

## 2023-06-24 DIAGNOSIS — E789 Disorder of lipoprotein metabolism, unspecified: Secondary | ICD-10-CM | POA: Diagnosis not present

## 2023-06-24 DIAGNOSIS — Z720 Tobacco use: Secondary | ICD-10-CM

## 2023-06-24 DIAGNOSIS — E7800 Pure hypercholesterolemia, unspecified: Secondary | ICD-10-CM

## 2023-06-24 NOTE — Patient Instructions (Addendum)
 Keep your Repatha shot on the weekday Make sure you write it down on the calendar Repeat cholesterol labs with Dr. Renne Crigler

## 2023-06-24 NOTE — Progress Notes (Signed)
 Patient ID: Jenny Oliver                 DOB: 1945/08/23                    MRN: [id number]      HPI: Jenny Oliver is a 78 y.o. female patient referred to lipid clinic by Jari Favre, PA. PMH is significant for pulmonary hypertension, emphysema/COPD, CT with left main and LAD/CFX calcification, tobacco use.   She was initially started on Praluent and then transition to Repatha due to insurance.  Recently her LDL cholesterol when checked by her primary care doctor was above goal.  Was checked again by cardiology in January which showed an LDL-C of 89.  Patient referred back to lipid clinic.  Patient presents today to lipid clinic.  Patient reports that her adherence to medications is not great.  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  Her diet is extremely poor often times not eating a full meal every day.  She reports eating junk food.  She lost a significant amount of weight and has gained some back but is holding steady around 104 pounds.  She will drink a protein shake called Owny once a day.  She admits she previously went about 6 months without taking her Repatha.  She says in 2025 that she is probably taken 2 shots of Repatha, last one was yesterday.  Reports that she was previously taking her shots on Sundays.  She writes when she is due for her shot on the calendar but often does not look at her calendar or pay much attention to it on the weekends.  Current Medications: Repatha 140mg  q 14 days Intolerances: rosuvastatin, Zetia, and Lipitor, pravastatin 40mg  Risk Factors: CAD on CT, tobacco use LDL-C goal: <70 ApoB goal: <80  Diet: Potato chips, pretzels.  1 vegan protein shake daily  Exercise: Not discussed  Family History:  Family History  Problem Relation Age of Onset   Asthma Mother    Heart disease Mother    Hyperlipidemia Mother    Cancer Father        stomach   Healthy Brother      Social History:   Social History   Socioeconomic History   Marital status: Divorced    Spouse name: Not on file   Number of children: 1   Years of education: graduate   Highest education level: Not on file  Occupational History   Occupation: retired  Tobacco Use   Smoking status: Every Day    Current packs/day: 1.50    Average packs/day: 1.5 packs/day for 48.0 years (72.0 ttl pk-yrs)    Types: Cigarettes   Smokeless tobacco: Never   Tobacco comments:    Started smoking about age 33    06/05/21 down to 0.5 pack    10-04-2018  per pt down to 2-3 cig per day from 1-2 ppd  Vaping Use   Vaping status: Never Used  Substance and Sexual Activity   Alcohol use: No   Drug use: Never   Sexual activity: Not on file  Other Topics Concern   Not on file  Social History Narrative   Patient drinks about 2 cups of caffeine daily.   Patient is right handed.   Social Drivers of Corporate investment banker Strain: Not on file  Food Insecurity: No Food Insecurity (07/30/2022)   Hunger Vital  Sign    Worried About Programme researcher, broadcasting/film/video in the Last Year: Never true    Ran Out of Food in the Last Year: Never true  Transportation Needs: No Transportation Needs (07/30/2022)   PRAPARE - Administrator, Civil Service (Medical): No    Lack of Transportation (Non-Medical): No  Physical Activity: Not on file  Stress: Not on file  Social Connections: Not on file  Intimate Partner Violence: Not At Risk (07/30/2022)   Humiliation, Afraid, Rape, and Kick questionnaire    Fear of Current or Ex-Partner: No    Emotionally Abused: No    Physically Abused: No    Sexually Abused: No     Labs: Lipid Panel     Component Value Date/Time   CHOL 166 04/28/2023 1627   TRIG 82 04/28/2023 1627   HDL 62 04/28/2023 1627   CHOLHDL 2.7 04/28/2023 1627   LDLCALC 89 04/28/2023 1627   LABVLDL 15 04/28/2023 1627    Past Medical History:  Diagnosis Date   Abnormality of gait    due to balance instability,  episodic    Benign paroxysmal positional vertigo    Bilateral lower extremity edema    Chronic insomnia 04/24/2015   COPD with emphysema (HCC)    last chest CT 01-12-2018   DDD (degenerative disc disease), lumbosacral    Full dentures    GERD (gastroesophageal reflux disease)    no meds   Headache    Hiatal hernia    History of esophageal dilatation    2013 balloon dilatation   History of Helicobacter pylori infection    Hypertensive chronic kidney disease with stage 1 through stage 4 chronic kidney disease, or unspecified chronic kidney disease    per pt pcp note in epic   IBS (irritable bowel syndrome)    IBS (irritable bowel syndrome)    Liddle's syndrome    hypertension treated with potassium sparing diuretic, amiloride   Meningioma (HCC)    left cavernous sinus, MRI 09-16-2015 in epic;  followed by neurologist, dr Anne Hahn   OA (osteoarthritis)    Right ureteral stone    Sciatica    Smokers' cough (HCC)    occasionally productive   Vitamin B12 deficiency    Vitamin D deficiency     Current Outpatient Medications on File Prior to Visit  Medication Sig Dispense Refill   Evolocumab (REPATHA SURECLICK) 140 MG/ML SOAJ Inject 140 mg into the skin every 14 (fourteen) days. 2 mL 3   Tiotropium Bromide Monohydrate (SPIRIVA RESPIMAT) 2.5 MCG/ACT AERS inhale TWO puffs into THE lungs DAILY 4 g 1   acetaminophen (TYLENOL) 500 MG tablet Take 2 tablets (1,000 mg total) by mouth every 6 (six) hours as needed. 30 tablet 0   albuterol (VENTOLIN HFA) 108 (90 Base) MCG/ACT inhaler Inhale 1 puff into the lungs every 6 (six) hours as needed for shortness of breath.     aMILoride (MIDAMOR) 5 MG tablet Take 5 mg by mouth daily.     aspirin EC 81 MG tablet Take 1 tablet (81 mg total) by mouth daily. Swallow whole. 90 tablet 3   cetirizine (ZYRTEC) 10 MG tablet Take 1 tablet (10 mg total) by mouth 2 (two) times daily as needed for allergies (or hives or swelling). 180 tablet 3   Coenzyme Q10 (CO Q 10) 10 MG CAPS  Take 1 capsule by mouth daily.     EPIPEN 2-PAK 0.3 MG/0.3ML SOAJ injection Inject 0.3 mg into the muscle as needed.  2 each 1   GLUCOSAMINE-CHONDROITIN DS PO Take 1 tablet by mouth in the morning and at bedtime.     No current facility-administered medications on file prior to visit.    Allergies  Allergen Reactions   Alpha Blocker Quinazolines Other (See Comments)    unknown   Clindamycin Other (See Comments)    unknown   Fluticasone Dermatitis and Other (See Comments)   Guaifenesin Er Other (See Comments)    unknown   [place] Bread [China Root (Wolfiporia Cocos)] Other (See Comments)    Unknown   Lipitor [Atorvastatin] Nausea And Vomiting   Loratadine Other (See Comments)    unknown   Milk-Related Compounds Other (See Comments)    Unknown   Other     Per pt avoids Bread, peanuts/tree nuts due to bloating  opioids causes nausea and vomiting Lactose intolerance   Peanut-Containing Drug Products Other (See Comments)    Unknown   Rosuvastatin Nausea Only and Other (See Comments)   Zetia [Ezetimibe]     Unknown    Assessment/Plan:  1. Hyperlipidemia -  Pure hypercholesterolemia Assessment: LDL-C above goal of less than 70 on latest cholesterol labs Patient inconsistent with taking her Repatha LDL-C previously when taking very faithfully was in the 60s Previously taking shots on Sundays however she does not usually look at her calendar for reminders on Sundays Does not think putting a reminder in her phone would be helpful Discussed the importance of eating a well-balanced diet Discussed the importance of taking her cholesterol medication I did offer her to switch to [facility] however patient did not think this would be good for her.  Plan: Change injection date to a weekday when she more frequently looks at her calendar Continue to keep Repatha in the fridge where she sees it when she opens it She will have labs rechecked this summer with Dr. Renne Crigler If LDL-C still above  goal at that time possible low-dose statin could be an option to add.    Thank you,  Olene Floss, Pharm.D, BCACP, CPP [facility] [address] [place], [place] [address]  Phone: [phone]; Fax: [phone]

## 2023-06-24 NOTE — Assessment & Plan Note (Signed)
 Assessment: LDL-C above goal of less than 70 on latest cholesterol labs Patient inconsistent with taking her Repatha LDL-C previously when taking very faithfully was in the 60s Previously taking shots on Sundays however she does not usually look at her calendar for reminders on Sundays Does not think putting a reminder in her phone would be helpful Discussed the importance of eating a well-balanced diet Discussed the importance of taking her cholesterol medication I did offer her to switch to [facility] however patient did not think this would be good for her.  Plan: Change injection date to a weekday when she more frequently looks at her calendar Continue to keep Repatha in the fridge where she sees it when she opens it She will have labs rechecked this summer with Dr. Renne Crigler If LDL-C still above goal at that time possible low-dose statin could be an option to add.

## 2023-07-05 DIAGNOSIS — H2513 Age-related nuclear cataract, bilateral: Secondary | ICD-10-CM | POA: Diagnosis not present

## 2023-07-05 DIAGNOSIS — H04123 Dry eye syndrome of bilateral lacrimal glands: Secondary | ICD-10-CM | POA: Diagnosis not present

## 2023-08-03 ENCOUNTER — Telehealth: Payer: Self-pay | Admitting: Pharmacy Technician

## 2023-08-03 ENCOUNTER — Other Ambulatory Visit (HOSPITAL_COMMUNITY): Payer: Self-pay

## 2023-08-03 NOTE — Telephone Encounter (Signed)
 Pharmacy Patient Advocate Encounter   Received notification from CoverMyMeds that prior authorization for Repatha  is required/requested.   Insurance verification completed.   The patient is insured through [employer] .   Per test claim: PA required; PA submitted to above mentioned insurance via CoverMyMeds Key/confirmation #/EOC [id number] Status is pending

## 2023-08-03 NOTE — Telephone Encounter (Signed)
 Pharmacy Patient Advocate Encounter  Received notification from [employer] that Prior Authorization for Repatha  has been APPROVED from 03/30/23 to 08/02/24. Ran test claim, Copay is $30..- one month. This test claim was processed through [facility]- copay amounts may vary at other pharmacies due to pharmacy/plan contracts, or as the patient moves through the different stages of their insurance plan.   PA #/Case ID/Reference #: [id number]

## 2023-08-10 DIAGNOSIS — M5136 Other intervertebral disc degeneration, lumbar region with discogenic back pain only: Secondary | ICD-10-CM | POA: Diagnosis not present

## 2023-08-10 DIAGNOSIS — M9904 Segmental and somatic dysfunction of sacral region: Secondary | ICD-10-CM | POA: Diagnosis not present

## 2023-08-10 DIAGNOSIS — M9903 Segmental and somatic dysfunction of lumbar region: Secondary | ICD-10-CM | POA: Diagnosis not present

## 2023-08-10 DIAGNOSIS — M9905 Segmental and somatic dysfunction of pelvic region: Secondary | ICD-10-CM | POA: Diagnosis not present

## 2023-08-16 DIAGNOSIS — M9903 Segmental and somatic dysfunction of lumbar region: Secondary | ICD-10-CM | POA: Diagnosis not present

## 2023-08-16 DIAGNOSIS — M9905 Segmental and somatic dysfunction of pelvic region: Secondary | ICD-10-CM | POA: Diagnosis not present

## 2023-08-16 DIAGNOSIS — M5136 Other intervertebral disc degeneration, lumbar region with discogenic back pain only: Secondary | ICD-10-CM | POA: Diagnosis not present

## 2023-08-16 DIAGNOSIS — M9904 Segmental and somatic dysfunction of sacral region: Secondary | ICD-10-CM | POA: Diagnosis not present

## 2023-09-12 DIAGNOSIS — M9905 Segmental and somatic dysfunction of pelvic region: Secondary | ICD-10-CM | POA: Diagnosis not present

## 2023-09-12 DIAGNOSIS — M9903 Segmental and somatic dysfunction of lumbar region: Secondary | ICD-10-CM | POA: Diagnosis not present

## 2023-09-12 DIAGNOSIS — M9904 Segmental and somatic dysfunction of sacral region: Secondary | ICD-10-CM | POA: Diagnosis not present

## 2023-09-12 DIAGNOSIS — M5136 Other intervertebral disc degeneration, lumbar region with discogenic back pain only: Secondary | ICD-10-CM | POA: Diagnosis not present

## 2023-09-13 ENCOUNTER — Ambulatory Visit (INDEPENDENT_AMBULATORY_CARE_PROVIDER_SITE_OTHER): Payer: Medicare Other | Admitting: Podiatry

## 2023-09-13 ENCOUNTER — Encounter: Payer: Self-pay | Admitting: Podiatry

## 2023-09-13 VITALS — Ht 62.0 in | Wt 104.6 lb

## 2023-09-13 DIAGNOSIS — Q828 Other specified congenital malformations of skin: Secondary | ICD-10-CM | POA: Diagnosis not present

## 2023-09-13 DIAGNOSIS — M79671 Pain in right foot: Secondary | ICD-10-CM

## 2023-09-13 DIAGNOSIS — M79672 Pain in left foot: Secondary | ICD-10-CM | POA: Diagnosis not present

## 2023-09-18 NOTE — Progress Notes (Signed)
 Subjective:  Patient ID: Jenny Oliver, female    DOB: 1946/03/13,  MRN: [id number]  Jenny Oliver presents to clinic today for painful porokeratotic lesions of both feet. Pain prevent(s) comfortable ambulation. Aggravating factor is weightbearing with and without shoegear.  Chief Complaint  Patient presents with   Nail Problem    Pt is here for [facility] PCP is Dr Clarice and LOV was in July.   New problem(s): None.   PCP is Clarice Nottingham, MD.  Allergies  Allergen Reactions   Alpha Blocker Quinazolines Other (See Comments)    unknown   Clindamycin Other (See Comments)    unknown   Fluticasone Dermatitis and Other (See Comments)   Guaifenesin Er Other (See Comments)    unknown   [place] Bread [China Root (Wolfiporia Cocos)] Other (See Comments)    Unknown   Lipitor [Atorvastatin] Nausea And Vomiting   Loratadine Other (See Comments)    unknown   Milk-Related Compounds Other (See Comments)    Unknown   Other     Per pt avoids Bread, peanuts/tree nuts due to bloating  opioids causes nausea and vomiting Lactose intolerance   Peanut-Containing Drug Products Other (See Comments)    Unknown   Rosuvastatin  Nausea Only and Other (See Comments)   Zetia [Ezetimibe]     Unknown    Review of Systems: Negative except as noted in the HPI.  Objective: No changes noted in today's physical examination. There were no vitals filed for this visit. Jenny Oliver is a pleasant 78 y.o. female WD, WN in NAD. AAO x 3.  Vascular Examination: Capillary refill time immediate b/l. Palpable pedal pulses. Pedal hair present b/l. No pain with calf compression b/l. Skin temperature gradient WNL b/l. No cyanosis or clubbing b/l. No ischemia or gangrene noted b/l. Pedal hair absent. No edema noted b/l LE.  Neurological Examination: Sensation grossly intact b/l with 10 gram monofilament. Vibratory sensation intact b/l.   Dermatological Examination: Pedal skin with normal turgor, texture and tone b/l.  No  open wounds b/l LE. No interdigital macerations noted b/l LE. Toenail(s) 1-5 bilaterally mycotic with adequate length.   Porokeratotic lesion(s) medial DIPJ of L 2nd toe, R 5th toe, submet head 1 left foot, and submet head 5 b/l. No erythema, no edema, no drainage, no fluctuance.  Musculoskeletal Examination: Normal muscle strength 5/5 to all lower extremity muscle groups bilaterally. HAV with bunion bilaterally and hammertoes 2-5 b/l.SABRA No pain, crepitus or joint limitation noted with ROM b/l LE.  Patient ambulates independently without assistive aids.  Radiographs: None  Assessment/Plan: 1. Porokeratosis   2. Pain in both feet   -Consent given for treatment as described below: -Examined patient. -Patient to continue soft, supportive shoe gear daily. -Porokeratotic lesion(s) dorsal PIPJ of L 2nd toe and R 5th toe, submet head 1 left foot, and submet head 5 b/l pared and enucleated with sterile currette without incident. Total number of lesions debrided=5. -Patient/POA to call should there be question/concern in the interim.   Return in about 3 months (around 12/14/2023).  Jenny Oliver, DPM      [place] [place] LOCATION: [address], [place] [address]  Office [phone]   [facility] LOCATION: [address] [place], [place] [address] Office [phone]

## 2023-09-19 DIAGNOSIS — M9904 Segmental and somatic dysfunction of sacral region: Secondary | ICD-10-CM | POA: Diagnosis not present

## 2023-09-19 DIAGNOSIS — M9903 Segmental and somatic dysfunction of lumbar region: Secondary | ICD-10-CM | POA: Diagnosis not present

## 2023-09-19 DIAGNOSIS — M5136 Other intervertebral disc degeneration, lumbar region with discogenic back pain only: Secondary | ICD-10-CM | POA: Diagnosis not present

## 2023-09-19 DIAGNOSIS — M9905 Segmental and somatic dysfunction of pelvic region: Secondary | ICD-10-CM | POA: Diagnosis not present

## 2023-09-28 DIAGNOSIS — M5136 Other intervertebral disc degeneration, lumbar region with discogenic back pain only: Secondary | ICD-10-CM | POA: Diagnosis not present

## 2023-09-28 DIAGNOSIS — M9905 Segmental and somatic dysfunction of pelvic region: Secondary | ICD-10-CM | POA: Diagnosis not present

## 2023-09-28 DIAGNOSIS — M9904 Segmental and somatic dysfunction of sacral region: Secondary | ICD-10-CM | POA: Diagnosis not present

## 2023-09-28 DIAGNOSIS — M9903 Segmental and somatic dysfunction of lumbar region: Secondary | ICD-10-CM | POA: Diagnosis not present

## 2023-11-03 ENCOUNTER — Encounter: Payer: Self-pay | Admitting: Acute Care

## 2023-11-24 ENCOUNTER — Other Ambulatory Visit: Payer: Self-pay | Admitting: Acute Care

## 2023-11-24 DIAGNOSIS — Z122 Encounter for screening for malignant neoplasm of respiratory organs: Secondary | ICD-10-CM

## 2023-11-24 DIAGNOSIS — Z87891 Personal history of nicotine dependence: Secondary | ICD-10-CM

## 2023-11-24 DIAGNOSIS — F17210 Nicotine dependence, cigarettes, uncomplicated: Secondary | ICD-10-CM

## 2023-12-05 ENCOUNTER — Inpatient Hospital Stay: Admission: RE | Admit: 2023-12-05 | Source: Ambulatory Visit

## 2023-12-07 ENCOUNTER — Other Ambulatory Visit: Payer: Self-pay | Admitting: Physician Assistant

## 2023-12-09 ENCOUNTER — Telehealth: Payer: Self-pay | Admitting: Cardiology

## 2023-12-09 DIAGNOSIS — I2510 Atherosclerotic heart disease of native coronary artery without angina pectoris: Secondary | ICD-10-CM

## 2023-12-09 DIAGNOSIS — E789 Disorder of lipoprotein metabolism, unspecified: Secondary | ICD-10-CM

## 2023-12-09 DIAGNOSIS — E7800 Pure hypercholesterolemia, unspecified: Secondary | ICD-10-CM

## 2023-12-09 MED ORDER — REPATHA SURECLICK 140 MG/ML ~~LOC~~ SOAJ: 140.0000 mg | SUBCUTANEOUS | 1 refills | Status: AC

## 2023-12-09 NOTE — Telephone Encounter (Signed)
*  STAT* If patient is at the pharmacy, call can be transferred to refill team.   1. Which medications need to be refilled? (please list name of each medication and dose if known)   Evolocumab (REPATHA SURECLICK) 140 MG/ML SOAJ   2. Would you like to learn more about the convenience, safety, & potential cost savings by using the [facility]?   3. Are you open to using the [facility] (Type [facility]. ).  4. Which pharmacy/location (including street and city if local pharmacy) is medication to be sent to?  [facility] - [place], [place] - [address]   5. Do they need a 30 day or 90 day supply?   30 day  Patient stated she is completely out of this medication.

## 2023-12-12 ENCOUNTER — Ambulatory Visit
Admission: RE | Admit: 2023-12-12 | Discharge: 2023-12-12 | Disposition: A | Discharge status: Home / Self Care | Source: Ambulatory Visit | Attending: Acute Care | Admitting: Acute Care

## 2023-12-12 DIAGNOSIS — Z122 Encounter for screening for malignant neoplasm of respiratory organs: Secondary | ICD-10-CM

## 2023-12-12 DIAGNOSIS — F17210 Nicotine dependence, cigarettes, uncomplicated: Secondary | ICD-10-CM | POA: Diagnosis not present

## 2023-12-12 DIAGNOSIS — Z87891 Personal history of nicotine dependence: Secondary | ICD-10-CM

## 2023-12-23 ENCOUNTER — Ambulatory Visit (HOSPITAL_BASED_OUTPATIENT_CLINIC_OR_DEPARTMENT_OTHER): Payer: Self-pay | Admitting: Pulmonary Disease

## 2023-12-23 NOTE — Telephone Encounter (Signed)
 FYI Only or Action Required?: Action required by provider: request for appointment and update on patient condition.  Patient is followed in Pulmonology for emphysema, last seen on 04/21/2023 by Kassie Acquanetta Bradley, MD.  Called Nurse Triage reporting Shortness of Breath.  Symptoms began about a month ago.  Interventions attempted: Nebulizer treatments.  Symptoms are: gradually worsening.  Triage Disposition: See PCP When Office is Open (Within 3 Days)  Patient/caregiver understands and will follow disposition?: Unsure      Copied from CRM #[id number]. Topic: Clinical - Red Word Triage >> Dec 23, 2023 11:43 AM Devaughn RAMAN wrote: Red Word that prompted transfer to Nurse Triage: breathing has gotten worst.      Reason for Disposition  [1] MODERATE longstanding difficulty breathing (e.g., speaks in phrases, SOB even at rest, pulse 100-120) AND [2] SAME as normal  Answer Assessment - Initial Assessment Questions No appointment's available in the office that she is available with, stating she would first be able to come in on 10/3 due to other appointments she has next week. Patient would like to know if something could be prescribed to help with her symptoms. ED and urgent care precautions discussed. Please advise.      1. RESPIRATORY STATUS: Describe your breathing? (e.g., wheezing, shortness of breath, unable to speak, severe coughing)      Increased shortness of breath  2. ONSET: When did this breathing problem begin?      A month, worse today  3. PATTERN Does the difficult breathing come and go, or has it been constant since it started?      Improves with inhaler  4. SEVERITY: How bad is your breathing? (e.g., mild, moderate, severe)      Mild to moderate  5. RECURRENT SYMPTOM: Have you had difficulty breathing before? If Yes, ask: When was the last time? and What happened that time?      Yes 6. CARDIAC HISTORY: Do you have any history of heart disease? (e.g., heart  attack, angina, bypass surgery, angioplasty)      Yes 7. LUNG HISTORY: Do you have any history of lung disease?  (e.g., pulmonary embolus, asthma, emphysema)     Pulmonary emphysema  8. CAUSE: What do you think is causing the breathing problem?      History of emphysema  9. OTHER SYMPTOMS: Do you have any other symptoms? (e.g., chest pain, cough, dizziness, fever, runny nose)     NO 10. O2 SATURATION MONITOR:  Do you use an oxygen saturation monitor (pulse oximeter) at home? If Yes, ask: What is your reading (oxygen level) today? What is your usual oxygen saturation reading? (e.g., 95%)       96% when checked yesterday  Protocols used: Breathing Difficulty-A-AH

## 2023-12-23 NOTE — Telephone Encounter (Signed)
 Left pt message to notify Jenny Oliver has some availability on Monday to be seen here at [facility] if she would like to call and schedule

## 2023-12-28 ENCOUNTER — Ambulatory Visit: Admitting: Podiatry

## 2024-01-17 ENCOUNTER — Ambulatory Visit: Admitting: Podiatry

## 2024-01-17 ENCOUNTER — Encounter: Payer: Self-pay | Admitting: Podiatry

## 2024-01-17 ENCOUNTER — Ambulatory Visit (INDEPENDENT_AMBULATORY_CARE_PROVIDER_SITE_OTHER): Admitting: Podiatry

## 2024-01-17 DIAGNOSIS — Q828 Other specified congenital malformations of skin: Secondary | ICD-10-CM

## 2024-01-17 DIAGNOSIS — M79671 Pain in right foot: Secondary | ICD-10-CM

## 2024-01-17 DIAGNOSIS — M79672 Pain in left foot: Secondary | ICD-10-CM

## 2024-01-18 ENCOUNTER — Other Ambulatory Visit (HOSPITAL_BASED_OUTPATIENT_CLINIC_OR_DEPARTMENT_OTHER): Payer: Self-pay | Admitting: Pulmonary Disease

## 2024-01-22 NOTE — Progress Notes (Signed)
 Subjective:  Patient ID: Jenny Oliver, female    DOB: 29-Oct-1945,  MRN: [id number]  Jenny Oliver presents to clinic today for painful porokeratotic lesions b/l lower extremities. Pain prevent(s) comfortable ambulation. Aggravating factor is weightbearing with and without shoegear.  Chief Complaint  Patient presents with   RFC    RFC not diabetic PCP Dr Ryan Hives, last year pt stated   New problem(s): None.   PCP is Hives Ryan, MD.  Allergies  Allergen Reactions   Alpha Blocker Quinazolines Other (See Comments)    unknown   Clindamycin Other (See Comments)    unknown   Fluticasone Dermatitis and Other (See Comments)   Guaifenesin Er Other (See Comments)    unknown   Indian Bread [China Root (Wolfiporia Cocos)] Other (See Comments)    Unknown   Lipitor [Atorvastatin] Nausea And Vomiting   Loratadine Other (See Comments)    unknown   Milk-Related Compounds Other (See Comments)    Unknown   Other     Per pt avoids Bread, peanuts/tree nuts due to bloating  opioids causes nausea and vomiting Lactose intolerance   Peanut-Containing Drug Products Other (See Comments)    Unknown   Rosuvastatin  Nausea Only and Other (See Comments)   Zetia [Ezetimibe]     Unknown    Review of Systems: Negative except as noted in the HPI.  Objective: No changes noted in today's physical examination. There were no vitals filed for this visit. Jenny Oliver is a pleasant 78 y.o. female WD, WN in NAD. AAO x 3.  Vascular Examination: Capillary refill time immediate b/l. Palpable pedal pulses. Pedal hair present b/l. No pain with calf compression b/l. Skin temperature gradient WNL b/l. No cyanosis or clubbing b/l. No ischemia or gangrene noted b/l. Pedal hair absent. No edema noted b/l LE.  Neurological Examination: Sensation grossly intact b/l with 10 gram monofilament. Vibratory sensation intact b/l.   Dermatological Examination: Pedal skin with normal turgor, texture and tone b/l.  No  open wounds b/l LE. No interdigital macerations noted b/l LE. Toenail(s) 1-5 bilaterally mycotic with adequate length.   Porokeratotic lesion(s) medial DIPJ of L 2nd toe, R 5th toe, submet head 1 left foot, and submet head 5 b/l. No erythema, no edema, no drainage, no fluctuance.  Musculoskeletal Examination: Normal muscle strength 5/5 to all lower extremity muscle groups bilaterally. HAV with bunion bilaterally and hammertoes 2-5 b/l.SABRA No pain, crepitus or joint limitation noted with ROM b/l LE.  Patient ambulates independently without assistive aids.  Radiographs: None  Assessment/Plan: 1. Porokeratosis   2. Pain in both feet   Patient was evaluated and treated. All patient's and/or POA's questions/concerns addressed on today's visit. Medicare ABN signed for paring of lesions. Toenails 1-5 b/l  debrided in length and girth without incident. Porokeratotic lesion(s) dorsal PIPJ of left second digit and right fifth digit, submet head 1 left foot, submet head 5 left foot, and submet head 5 right foot pared with sharp debridement without incident. Continue soft, supportive shoe gear daily. Report any pedal injuries to medical professional. Call office if there are any questions/concerns.  Return in about 3 months (around 04/18/2024).  Delon LITTIE Merlin, DPM      [place] LOCATION: [address]  [place], [place] [address]                   Office [phone]   [facility] LOCATION: [address] [place], [place] [address] Office [phone]

## 2024-01-30 DIAGNOSIS — E538 Deficiency of other specified B group vitamins: Secondary | ICD-10-CM | POA: Diagnosis not present

## 2024-01-30 DIAGNOSIS — I10 Essential (primary) hypertension: Secondary | ICD-10-CM | POA: Diagnosis not present

## 2024-01-30 DIAGNOSIS — R7303 Prediabetes: Secondary | ICD-10-CM | POA: Diagnosis not present

## 2024-01-30 DIAGNOSIS — M8580 Other specified disorders of bone density and structure, unspecified site: Secondary | ICD-10-CM | POA: Diagnosis not present

## 2024-01-30 DIAGNOSIS — I151 Hypertension secondary to other renal disorders: Secondary | ICD-10-CM | POA: Diagnosis not present

## 2024-01-30 DIAGNOSIS — Z23 Encounter for immunization: Secondary | ICD-10-CM | POA: Diagnosis not present

## 2024-01-30 DIAGNOSIS — N182 Chronic kidney disease, stage 2 (mild): Secondary | ICD-10-CM | POA: Diagnosis not present

## 2024-01-30 DIAGNOSIS — I2510 Atherosclerotic heart disease of native coronary artery without angina pectoris: Secondary | ICD-10-CM | POA: Diagnosis not present

## 2024-01-30 DIAGNOSIS — E785 Hyperlipidemia, unspecified: Secondary | ICD-10-CM | POA: Diagnosis not present

## 2024-02-29 DIAGNOSIS — J449 Chronic obstructive pulmonary disease, unspecified: Secondary | ICD-10-CM | POA: Diagnosis not present

## 2024-02-29 DIAGNOSIS — E7800 Pure hypercholesterolemia, unspecified: Secondary | ICD-10-CM | POA: Diagnosis not present

## 2024-02-29 DIAGNOSIS — E785 Hyperlipidemia, unspecified: Secondary | ICD-10-CM | POA: Diagnosis not present

## 2024-02-29 DIAGNOSIS — R7303 Prediabetes: Secondary | ICD-10-CM | POA: Diagnosis not present

## 2024-02-29 DIAGNOSIS — E538 Deficiency of other specified B group vitamins: Secondary | ICD-10-CM | POA: Diagnosis not present

## 2024-02-29 DIAGNOSIS — I152 Hypertension secondary to endocrine disorders: Secondary | ICD-10-CM | POA: Diagnosis not present

## 2024-02-29 DIAGNOSIS — I10 Essential (primary) hypertension: Secondary | ICD-10-CM | POA: Diagnosis not present

## 2024-02-29 DIAGNOSIS — M810 Age-related osteoporosis without current pathological fracture: Secondary | ICD-10-CM | POA: Diagnosis not present

## 2024-02-29 DIAGNOSIS — I700 Atherosclerosis of aorta: Secondary | ICD-10-CM | POA: Diagnosis not present

## 2024-02-29 DIAGNOSIS — I2510 Atherosclerotic heart disease of native coronary artery without angina pectoris: Secondary | ICD-10-CM | POA: Diagnosis not present

## 2024-02-29 DIAGNOSIS — J441 Chronic obstructive pulmonary disease with (acute) exacerbation: Secondary | ICD-10-CM | POA: Diagnosis not present

## 2024-02-29 DIAGNOSIS — Z0000 Encounter for general adult medical examination without abnormal findings: Secondary | ICD-10-CM | POA: Diagnosis not present

## 2024-03-01 ENCOUNTER — Ambulatory Visit: Payer: Medicare Other | Admitting: Allergy & Immunology

## 2024-03-06 ENCOUNTER — Ambulatory Visit (HOSPITAL_BASED_OUTPATIENT_CLINIC_OR_DEPARTMENT_OTHER): Admitting: Pulmonary Disease

## 2024-03-06 ENCOUNTER — Telehealth (HOSPITAL_BASED_OUTPATIENT_CLINIC_OR_DEPARTMENT_OTHER): Payer: Self-pay | Admitting: Pulmonary Disease

## 2024-03-06 ENCOUNTER — Encounter (HOSPITAL_BASED_OUTPATIENT_CLINIC_OR_DEPARTMENT_OTHER): Payer: Self-pay | Admitting: Pulmonary Disease

## 2024-03-06 VITALS — BP 151/95 | HR 79 | Ht 62.0 in | Wt 125.6 lb

## 2024-03-06 DIAGNOSIS — J449 Chronic obstructive pulmonary disease, unspecified: Secondary | ICD-10-CM

## 2024-03-06 DIAGNOSIS — R918 Other nonspecific abnormal finding of lung field: Secondary | ICD-10-CM | POA: Diagnosis not present

## 2024-03-06 DIAGNOSIS — J439 Emphysema, unspecified: Secondary | ICD-10-CM | POA: Diagnosis not present

## 2024-03-06 DIAGNOSIS — F17210 Nicotine dependence, cigarettes, uncomplicated: Secondary | ICD-10-CM | POA: Diagnosis not present

## 2024-03-06 MED ORDER — PREDNISONE 10 MG PO TABS: ORAL_TABLET | ORAL | 0 refills | Status: AC

## 2024-03-06 MED ORDER — STIOLTO RESPIMAT 2.5-2.5 MCG/ACT IN AERS: 2.0000 | INHALATION_SPRAY | Freq: Every day | RESPIRATORY_TRACT | 5 refills | Status: DC

## 2024-03-06 NOTE — Telephone Encounter (Addendum)
 Patient has failed Anoro. Previously tolerated Spiriva . Would like to step up to [facility] however coverage exclusion. What steps need to be taken for Stiolto to be covered?

## 2024-03-06 NOTE — Progress Notes (Signed)
 Subjective:   PATIENT ID: Jenny Oliver GENDER: female DOB: [birth year], MRN: [id number]   HPI  Chief Complaint  Patient presents with   Follow-up    Pulmonary emphysema    Reason for Visit: Follow-up emphysema  [name] is a 78 year old female active smoker with COPD, allergic rhinitis, GERD, hyperlipidemia, osteoarthritis Liddle's syndrome who presents for follow-up emphysema.  Initial consult 06/06/22 She has been referred by her PCP Dr. Clarice for pulmonary evaluation.  She was seen on 05/18/2021.  Advised to quit smoking and prescribed Proventil  HFA.  She is using her rescue inhaler 1-2 times a day with improvement in her shortness of breath associated with chest tightness. Also improves with rest. Occasional non-productive cough. Triggered by cold air. Her symptoms have worsening in the last six months with more frequent shortness of breath. Denies wheezing and no nocturnal symptoms.   07/16/22 Since our last visit she was started on Spiriva  and reports her shortness of breath, cough and chest tightness had improved by 95%. Does have occasional coughing spells. She was contacted regarding her pulmonary nodules on 05/11/22. She is still lighting up cigarettes 2-5 a day. Has not tried her Chantix . No exacerbations since our last visit  04/21/23 Since our last visit she reports she is compliant with Spiriva . Uses albuterol  every 2-3 months. Cold air worsens her coughing. Shortness of breath is fine. Denies chest tightness. Denies exacerbations since our last visit. Any symptoms she has usually is taken care with albuterol . She still has smoking 2 cigarettes daily.  03/06/24 Since our last visit we had stepped up from Spiriva  to Anoro, she has had worsening cough and shortness of breath. Unable to take Anoro and felt spiriva  was better. Cough is triggered by shortness of breath. Now wheezing. Has had to use albuterol  3-5 times a day for the last several months. Still smoking 2 puffs  off cigarette and throws it away.  Social History: Active smoker. Smokes 1/4 ppd. 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   Past Medical History:  Diagnosis Date   Abnormality of gait    due to balance instability,  episodic   Benign paroxysmal positional vertigo    Bilateral lower extremity edema    Chronic insomnia 04/24/2015   COPD with emphysema (HCC)    last chest CT 01-12-2018   DDD (degenerative disc disease), lumbosacral    Full dentures    GERD (gastroesophageal reflux disease)    no meds   Headache    Hiatal hernia    History of esophageal dilatation    2013 balloon dilatation   History of Helicobacter pylori infection    Hypertensive chronic kidney disease with stage 1 through stage 4 chronic kidney disease, or unspecified chronic kidney disease    per pt pcp note in epic   IBS (irritable bowel syndrome)    IBS (irritable bowel syndrome)    Liddle's syndrome    hypertension treated with potassium sparing diuretic, amiloride   Meningioma (HCC)    left cavernous sinus, MRI 09-16-2015 in epic;  followed by neurologist, dr jenel   OA (osteoarthritis)    Right ureteral stone    Sciatica    Smokers' cough (HCC)    occasionally productive   Vitamin B12 deficiency    Vitamin D deficiency      Family History  Problem Relation Age of Onset   Asthma Mother    Heart disease Mother    Hyperlipidemia Mother  Cancer Father        stomach   Healthy Brother      Social History   Occupational History   Occupation: retired  Tobacco Use   Smoking status: Every Day    Current packs/day: 1.50    Average packs/day: 1.5 packs/day for 48.0 years (72.0 ttl pk-yrs)    Types: Cigarettes   Smokeless tobacco: Never   Tobacco comments:    Started smoking about age 57    06/05/21 down to 0.5 pack    10-04-2018  per pt down to 2-3 cig per day from 1-2 ppd  Vaping Use   Vaping status: Never Used  Substance and Sexual Activity   Alcohol use: No    Drug use: Never   Sexual activity: Not on file    Allergies  Allergen Reactions   Alpha Blocker Quinazolines Other (See Comments)    unknown   Clindamycin Other (See Comments)    unknown   Fluticasone Dermatitis and Other (See Comments)   Guaifenesin Er Other (See Comments)    unknown   Indian Bread [China Root (Wolfiporia Cocos)] Other (See Comments)    Unknown   Lipitor [Atorvastatin] Nausea And Vomiting   Loratadine Other (See Comments)    unknown   Milk-Related Compounds Other (See Comments)    Unknown   Other     Per pt avoids Bread, peanuts/tree nuts due to bloating  opioids causes nausea and vomiting Lactose intolerance   Peanut-Containing Drug Products Other (See Comments)    Unknown   Rosuvastatin  Nausea Only and Other (See Comments)   Zetia [Ezetimibe]     Unknown     Outpatient Medications Prior to Visit  Medication Sig Dispense Refill   acetaminophen  (TYLENOL ) 500 MG tablet Take 2 tablets (1,000 mg total) by mouth every 6 (six) hours as needed. 30 tablet 0   albuterol  (VENTOLIN  HFA) 108 (90 Base) MCG/ACT inhaler Inhale 1 puff into the lungs every 6 (six) hours as needed for shortness of breath.     aMILoride (MIDAMOR) 5 MG tablet Take 5 mg by mouth daily.     aspirin  EC 81 MG tablet Take 1 tablet (81 mg total) by mouth daily. Swallow whole. 90 tablet 3   cetirizine  (ZYRTEC ) 10 MG tablet Take 1 tablet (10 mg total) by mouth 2 (two) times daily as needed for allergies (or hives or swelling). 180 tablet 3   Coenzyme Q10 (CO Q 10) 10 MG CAPS Take 1 capsule by mouth daily.     EPIPEN  2-PAK 0.3 MG/0.3ML SOAJ injection Inject 0.3 mg into the muscle as needed. 2 each 1   Evolocumab  (REPATHA  SURECLICK) 140 MG/ML SOAJ Inject 140 mg into the skin every 14 (fourteen) days. 6 mL 1   GLUCOSAMINE-CHONDROITIN DS PO Take 1 tablet by mouth in the morning and at bedtime.     Tiotropium Bromide  Monohydrate (SPIRIVA  RESPIMAT) 2.5 MCG/ACT AERS inhale TWO puffs into THE lungs DAILY 4 g  1   umeclidinium-vilanterol (ANORO ELLIPTA ) 62.5-25 MCG/ACT AEPB INHALE ONE PUFF into THE lungs DAILY 60 each 1   No facility-administered medications prior to visit.    Review of Systems  Constitutional:  Negative for chills, diaphoresis, fever, malaise/fatigue and weight loss.  HENT:  Negative for congestion.   Respiratory:  Positive for cough and shortness of breath. Negative for hemoptysis, sputum production and wheezing.   Cardiovascular:  Negative for chest pain, palpitations and leg swelling.     Objective:   Vitals:   03/06/24  1555  BP: (!) 151/95  Pulse: 79  SpO2: 96%  Weight: 125 lb 9.6 oz (57 kg)  Height: 5' 2 (1.575 m)   SpO2: 96 %  Physical Exam: General: Well-appearing, no acute distress HENT: [place], AT Eyes: EOMI, no scleral icterus Respiratory: Clear to auscultation bilaterally.  No crackles, wheezing or rales Cardiovascular: RRR, -M/R/G, no JVD Extremities:-Edema,-tenderness Neuro: AAO x4, CNII-XII grossly intact Psych: Normal mood, normal affect  Data Reviewed:  Imaging: CT lung screening 05/11/2021-moderate centrilobular emphysema with diffuse bronchial wall thickening.  No growth or interval of previously seen pulmonary nodules CT lung screen 05/13/22 - moderate centrilobular emphysema. Scattered nodules with new tiny bilaterally pulmonary nodules with larges 5-6 mm in RUL CT lung screen 11/11/22 - moderate centrilobular emphysema. Unchanged or decreased pulmonary nodules. Interval LUL ground glass nodule 3.9 mm CT lung screen 12/19/23 Unchanged centrilobular emphysema. Unchanged pulmonary nodules with max nodule 5.1 mm.  PFT: 05/13/2016 FVC 2.11 (93%) FEV1 1.48 (84%) Ratio 68  Interpretation: Mild obstructive defect is present.  No significant bronchodilator response however does not preclude the use of bronchodilators.  F-V loops suggestive of obstructive defect  Labs: OSH absolute eosinophils 09/17/2020-100     Assessment & Plan:   Discussion: 78  year old female active smoker with COPD, allergic rhinitis, GERD, HLD, osteoarthritis, Liddles syndrome who presents for follow-up. Persistent symptoms on Anoro. Will switch to alternative LAMA/LABA. Counseled briefly on smoking cessation. Discussed clinical course and management of COPD including bronchodilator regimen, preventive care including vaccinations and action plan for exacerbation.  Patient has milk allergy  to food products but willing to try inhaler   Emphysema - uncontrolled symptoms that is worsened with colder weather --STOP Anoro --START Stiolto TWO puffs ONCE a day   --CONTINUE Proventil  TWO puffs AS NEEDED for shortness of breath or wheezing Addendum: Bevespi  preferred by insurance. Patient agreeable to switch from Stiolto  Tobacco abuse Patient is an active smoker. We discussed smoking cessation for <3 minutes. We discussed triggers and stressors and ways to deal with them. We discussed barriers to continued smoking and benefits of smoking cessation. Provided patient with information cessation techniques and interventions including [place] quitline.  Waxing and waning subcentimeter nodules Stable nodules, subcentimenter --No longer qualified for lung screen  Health Maintenance Immunization History  Administered Date(s) Administered   PNEUMOCOCCAL CONJUGATE-20 10/12/2022   Pneumococcal Conjugate-13 11/26/2016   Pneumococcal Polysaccharide-23 11/28/2007   CT Lung Screen - no longer qualified  No orders of the defined types were placed in this encounter.  Meds ordered this encounter  Medications   predniSONE  (DELTASONE ) 10 MG tablet    Sig: Take 4 tablets (40 mg total) by mouth daily with breakfast for 2 days, THEN 3 tablets (30 mg total) daily with breakfast for 2 days, THEN 2 tablets (20 mg total) daily with breakfast for 2 days, THEN 1 tablet (10 mg total) daily with breakfast for 2 days.    Dispense:  20 tablet    Refill:  0   DISCONTD: Tiotropium Bromide -Olodaterol  (STIOLTO RESPIMAT ) 2.5-2.5 MCG/ACT AERS    Sig: Inhale 2 puffs into the lungs daily.    Dispense:  4 g    Refill:  5    Return in about 4 months (around 07/05/2024).  I have spent a total time of 33-minutes on the day of the appointment including chart review, data review, collecting history, coordinating care and discussing medical diagnosis and plan with the patient/family. Past medical history, allergies, medications were reviewed. Pertinent imaging, labs  and tests included in this note have been reviewed and interpreted independently by me.  Brienne Liguori Slater Staff, MD [facility] 03/06/2024 4:08 PM

## 2024-03-06 NOTE — Patient Instructions (Addendum)
 Emphysema - uncontrolled symptoms that is worsened with colder weather COPD exacerbation --Prednisone  taper --STOP Anoro --START Stiolto TWO puffs ONCE a day. Will attempt to qualify --CONTINUE Proventil  TWO puffs AS NEEDED for shortness of breath or wheezing --Recommend quit smoking  Waxing and waning subcentimeter nodules Stable nodules, subcentimenter --No longer qualified for lung screen

## 2024-03-08 ENCOUNTER — Other Ambulatory Visit (HOSPITAL_COMMUNITY): Payer: Self-pay

## 2024-03-09 ENCOUNTER — Telehealth: Payer: Self-pay | Admitting: Pharmacist

## 2024-03-09 MED ORDER — BEVESPI AEROSPHERE 9-4.8 MCG/ACT IN AERO: 2.0000 | INHALATION_SPRAY | Freq: Two times a day (BID) | RESPIRATORY_TRACT | 2 refills | Status: AC

## 2024-03-09 NOTE — Telephone Encounter (Signed)
 Received fax from Dr. Theressa office with lipid lab results from November.  LDL-C down to 55, triglycerides 64, HDL 71, total cholesterol 860.  LDL-C well-controlled patient is taking Repatha  what appears to be much more consistently

## 2024-03-09 NOTE — Telephone Encounter (Signed)
 Patient has failed Anoro. Alternatives offered by pharmacy included Bevespi for LABA/LAMA. If this is ineffective, would consider Stiolto. Patient updated and willing to try Bevespi

## 2024-03-12 ENCOUNTER — Encounter (HOSPITAL_BASED_OUTPATIENT_CLINIC_OR_DEPARTMENT_OTHER): Payer: Self-pay | Admitting: Pulmonary Disease

## 2024-03-13 ENCOUNTER — Telehealth: Payer: Self-pay

## 2024-03-13 NOTE — Telephone Encounter (Signed)
*  Pulm  Pharmacy Patient Advocate Encounter   Received notification from Fax that prior authorization for Stiolto Respimat  is required/requested.   Insurance verification completed.   The patient is insured through [facility].   Per test claim:  Bevespi  is preferred by the insurance.  If suggested medication is appropriate, Please send in a new RX and discontinue this one. If not, please advise as to why it's not appropriate so that we may request a Prior Authorization. Please note, some preferred medications may still require a PA.  If the suggested medications have not been trialed and there are no contraindications to their use, the PA will not be submitted, as it will not be approved.

## 2024-03-13 NOTE — Telephone Encounter (Signed)
 Med changed to Bevespi  at this time, nothing further needed.

## 2024-04-25 ENCOUNTER — Ambulatory Visit: Admitting: Podiatry

## 2024-04-25 ENCOUNTER — Encounter: Payer: Self-pay | Admitting: Podiatry

## 2024-04-25 DIAGNOSIS — Q828 Other specified congenital malformations of skin: Secondary | ICD-10-CM

## 2024-04-25 DIAGNOSIS — M79671 Pain in right foot: Secondary | ICD-10-CM

## 2024-04-28 NOTE — Progress Notes (Signed)
 "  Subjective:  Patient ID: Jenny Oliver, female    DOB: 07-23-1945,  MRN: [id number]  Jenny Oliver presents to clinic today for painful porokeratotic lesions of both feet. Pain prevent(s) comfortable ambulation. Aggravating factor is weightbearing with and without shoegear.  Chief Complaint  Patient presents with   RFC     RFC Non diabetic toenail trim. LOV with PCP 03/15/24.   New problem(s): None.   PCP is Clarice Nottingham, MD.  Allergies[1]  Review of Systems: Negative except as noted in the HPI.  Objective: No changes noted in today's physical examination. There were no vitals filed for this visit. Jenny Oliver is a pleasant 79 y.o. female WD, WN in NAD. AAO x 3.  Vascular Examination: Capillary refill time immediate b/l. Palpable pedal pulses. Pedal hair present b/l. No pain with calf compression b/l. Skin temperature gradient WNL b/l. No cyanosis or clubbing b/l. No ischemia or gangrene noted b/l. Pedal hair absent. No edema noted b/l LE.  Neurological Examination: Sensation grossly intact b/l with 10 gram monofilament. Vibratory sensation intact b/l.   Dermatological Examination: Pedal skin with normal turgor, texture and tone b/l.  No open wounds b/l LE. No interdigital macerations noted b/l LE. Toenail(s) 1-5 bilaterally mycotic with adequate length.   Porokeratotic lesion(s) meidal IPJ b/l great toes, medial PIPJ of L 2nd toe, R 5th toe, submet head 1 left foot, and submet head 5 b/l. No erythema, no edema, no drainage, no fluctuance.  Musculoskeletal Examination: Normal muscle strength 5/5 to all lower extremity muscle groups bilaterally. HAV with bunion bilaterally and hammertoes 2-5 b/l.Jenny Oliver No pain, crepitus or joint limitation noted with ROM b/l LE.  Patient ambulates independently without assistive aids.  Radiographs: None  Assessment/Plan: 1. Porokeratosis   2. Pain in both feet   Consent given for treatment. Patient examined. All patient's and/or POA's  questions/concerns addressed on today's visit  Porokeratotic lesion(s) medial IPJ of left great toe, medial IPJ of right great toe, dorsal PIPJ left 2nd toe, dorsal PIPJ right 5th toe, submet head 1 left foot, submet head 5 left foot, and submet head 5 right foot pared and enucleated with sharp debridement without incident.Continue soft, supportive shoe gear daily. Report any pedal injuries to medical professional. Call office if there are any quesitons/concerns. -Patient/POA to call should there be question/concern in the interim.   Return in about 3 months (around 07/24/2024).  Jenny Oliver, DPM      [place] LOCATION: [address] [place] [address]                   Office [phone]   [place] LOCATION: [address] [place], [place] [address] Office [phone]     [1]  Allergies Allergen Reactions   Alpha Blocker Quinazolines Other (See Comments)    unknown   Clindamycin Other (See Comments)    unknown   Fluticasone Dermatitis and Other (See Comments)   Guaifenesin Er Other (See Comments)    unknown   Indian Bread [China Root (Wolfiporia Cocos)] Other (See Comments)    Unknown   Lipitor [Atorvastatin] Nausea And Vomiting   Loratadine Other (See Comments)    unknown   Milk-Related  Compounds Other (See Comments)    Unknown   Other     Per pt avoids Bread, peanuts/tree nuts due to bloating  opioids causes nausea and vomiting Lactose intolerance   Peanut-Containing Drug Products Other (See Comments)    Unknown   Rosuvastatin  Nausea Only and Other (See Comments)   Zetia [Ezetimibe]     Unknown   "

## 2024-06-25 ENCOUNTER — Ambulatory Visit (HOSPITAL_BASED_OUTPATIENT_CLINIC_OR_DEPARTMENT_OTHER): Admitting: Pulmonary Disease

## 2024-06-27 ENCOUNTER — Ambulatory Visit (HOSPITAL_BASED_OUTPATIENT_CLINIC_OR_DEPARTMENT_OTHER): Admitting: Pulmonary Disease

## 2024-07-31 ENCOUNTER — Ambulatory Visit: Admitting: Podiatry
